# Patient Record
Sex: Male | Born: 1945
Health system: Southern US, Community
[De-identification: ages and names within clinical notes are randomized; demographics above are authoritative.]

## PROBLEM LIST (undated history)

## (undated) DIAGNOSIS — E785 Hyperlipidemia, unspecified: Secondary | ICD-10-CM

## (undated) DIAGNOSIS — I10 Essential (primary) hypertension: Secondary | ICD-10-CM

## (undated) DIAGNOSIS — K219 Gastro-esophageal reflux disease without esophagitis: Secondary | ICD-10-CM

## (undated) DIAGNOSIS — D689 Coagulation defect, unspecified: Secondary | ICD-10-CM

## (undated) DIAGNOSIS — H3530 Unspecified macular degeneration: Secondary | ICD-10-CM

## (undated) DIAGNOSIS — I2510 Atherosclerotic heart disease of native coronary artery without angina pectoris: Secondary | ICD-10-CM

## (undated) HISTORY — DX: Unspecified macular degeneration: H35.30

## (undated) HISTORY — PX: CATARACT EXTRACTION: SUR2

## (undated) HISTORY — DX: Coagulation defect, unspecified: D68.9

## (undated) HISTORY — DX: Essential (primary) hypertension: I10

## (undated) HISTORY — DX: Atherosclerotic heart disease of native coronary artery without angina pectoris: I25.10

## (undated) HISTORY — DX: Hyperlipidemia, unspecified: E78.5

## (undated) HISTORY — DX: Gastro-esophageal reflux disease without esophagitis: K21.9

---

## 2003-10-14 HISTORY — PX: PTCA: SHX146

## 2003-10-17 ENCOUNTER — Encounter: Admission: RE | Admit: 2003-10-17 | Discharge: 2003-10-17 | Payer: Self-pay | Admitting: Family Medicine

## 2003-11-06 ENCOUNTER — Ambulatory Visit (HOSPITAL_COMMUNITY): Admission: RE | Admit: 2003-11-06 | Discharge: 2003-11-07 | Payer: Self-pay | Admitting: Cardiology

## 2003-11-18 ENCOUNTER — Encounter (HOSPITAL_COMMUNITY): Admission: RE | Admit: 2003-11-18 | Discharge: 2004-02-16 | Payer: Self-pay | Admitting: Cardiology

## 2003-11-21 ENCOUNTER — Observation Stay (HOSPITAL_COMMUNITY): Admission: EM | Admit: 2003-11-21 | Discharge: 2003-11-21 | Payer: Self-pay | Admitting: Emergency Medicine

## 2004-02-17 ENCOUNTER — Encounter (HOSPITAL_COMMUNITY): Admission: RE | Admit: 2004-02-17 | Discharge: 2004-02-29 | Payer: Self-pay | Admitting: Cardiology

## 2004-03-03 ENCOUNTER — Encounter (HOSPITAL_COMMUNITY): Admission: RE | Admit: 2004-03-03 | Discharge: 2004-06-01 | Payer: Self-pay | Admitting: Cardiology

## 2004-04-15 ENCOUNTER — Ambulatory Visit (HOSPITAL_COMMUNITY): Admission: RE | Admit: 2004-04-15 | Discharge: 2004-04-15 | Payer: Self-pay | Admitting: Cardiology

## 2004-06-16 ENCOUNTER — Encounter (HOSPITAL_COMMUNITY): Admission: RE | Admit: 2004-06-16 | Discharge: 2004-09-09 | Payer: Self-pay | Admitting: Cardiology

## 2012-01-21 ENCOUNTER — Other Ambulatory Visit: Payer: Self-pay | Admitting: Family Medicine

## 2012-01-21 DIAGNOSIS — Z136 Encounter for screening for cardiovascular disorders: Secondary | ICD-10-CM

## 2012-01-28 ENCOUNTER — Ambulatory Visit
Admission: RE | Admit: 2012-01-28 | Discharge: 2012-01-28 | Disposition: A | Discharge status: Home / Self Care | Payer: 59 | Source: Ambulatory Visit | Attending: Family Medicine | Admitting: Family Medicine

## 2012-01-28 DIAGNOSIS — Z136 Encounter for screening for cardiovascular disorders: Secondary | ICD-10-CM

## 2014-06-13 DIAGNOSIS — I10 Essential (primary) hypertension: Secondary | ICD-10-CM | POA: Insufficient documentation

## 2014-06-13 DIAGNOSIS — I2510 Atherosclerotic heart disease of native coronary artery without angina pectoris: Secondary | ICD-10-CM | POA: Insufficient documentation

## 2014-06-13 DIAGNOSIS — E785 Hyperlipidemia, unspecified: Secondary | ICD-10-CM | POA: Insufficient documentation

## 2014-06-19 ENCOUNTER — Encounter: Payer: Self-pay | Admitting: Interventional Cardiology

## 2014-06-19 ENCOUNTER — Ambulatory Visit (INDEPENDENT_AMBULATORY_CARE_PROVIDER_SITE_OTHER): Payer: 59 | Admitting: Interventional Cardiology

## 2014-06-19 VITALS — BP 132/68 | HR 61 | Ht 70.0 in | Wt 221.4 lb

## 2014-06-19 DIAGNOSIS — I2510 Atherosclerotic heart disease of native coronary artery without angina pectoris: Secondary | ICD-10-CM

## 2014-06-19 DIAGNOSIS — I10 Essential (primary) hypertension: Secondary | ICD-10-CM

## 2014-06-19 DIAGNOSIS — E785 Hyperlipidemia, unspecified: Secondary | ICD-10-CM

## 2014-06-19 NOTE — Progress Notes (Signed)
Patient ID: Raymond Alexander, male   DOB: [birth year], 68 y.o.   MRN: [id number]    [address] [place], [place]  [address] Phone: [phone] Fax:  [phone]  Date:  06/19/2014   ID:  Raymond Alexander, DOB [birth year], MRN [id number]  PCP:  No primary provider on file.   ASSESSMENT:  1. Coronary artery disease with prior RCA stent 2004 and documented occluded septal perforator with collateral and 85% moderate size obtuse marginal branch 2. Hyperlipidemia, stable 3. Hypertension under excellent control  PLAN:  1. Maintain an active lifestyle 2. Lipids are being monitored and managed by primary physician, Dr. Lauretta Grill Koirala 3. Clinical followup with me in one year 4. No functional testing required   SUBJECTIVE: Raymond Alexander is a 68 y.o. male who is asymptomatic. No medication side effects. No neurological complaints. Denies claudication.   Wt Readings from Last 3 Encounters:  06/19/14 221 lb 6.4 oz (100.426 kg)     Past Medical History  Diagnosis Date  . Hypertension   . Hyperlipidemia   . Clotting disorder   . Esophageal reflux   . ASCVD (arteriosclerotic cardiovascular disease)     3 vessel    Current Outpatient Prescriptions  Medication Sig Dispense Refill  . aspirin 81 MG tablet Take 81 mg by mouth daily.      [name] atorvastatin (LIPITOR) 80 MG tablet Take 80 mg by mouth daily.      . Coenzyme Q10 (CO Q-10) 100 MG CAPS Take 300 mg by mouth daily.      . diphenhydrAMINE (BENADRYL) 25 mg capsule Take 50 mg by mouth as needed.      . ezetimibe (ZETIA) 10 MG tablet Take 10 mg by mouth daily.      . fexofenadine (ALLEGRA) 180 MG tablet Take 180 mg by mouth daily.      [name] lisinopril (PRINIVIL,ZESTRIL) 10 MG tablet Take 10 mg by mouth daily.      . Mag Aspart-Potassium Aspart (POTASSIUM & MAGNESIUM ASPARTAT) 250-250 MG CAPS Take 1 tablet by mouth daily.      . Multiple Vitamin (MULTIVITAMIN) tablet Take 1 tablet by mouth daily.      . nitroGLYCERIN (NITROSTAT)  0.4 MG SL tablet Place 0.4 mg under the tongue every 5 (five) minutes as needed for chest pain.      . Omega-3 Fatty Acids (FISH OIL) 1000 MG CAPS Take 2 capsules by mouth daily.      . sildenafil (VIAGRA) 50 MG tablet Take 50 mg by mouth daily as needed for erectile dysfunction.       No current facility-administered medications for this visit.    Allergies:   Not on File  Social History:  The patient  reports that he has quit smoking. He does not have any smokeless tobacco history on file. He reports that he drinks about .6 ounces of alcohol per week.   ROS:  Please see the history of present illness.   Has not noted lower joint swelling, palpitations, transient neurological complaints, wheezing, dyspnea, orthopnea, syncope, or other complaints   All other systems reviewed and negative.   OBJECTIVE: VS:  BP 132/68  Pulse 61  Ht 5\' 10"  (1.778 m)  Wt 221 lb 6.4 oz (100.426 kg)  BMI 31.77 kg/m2 Well nourished, well developed, in no acute distress, appears slightly overweight  HEENT: normal Neck: JVD flat. Carotid bruit absent  Cardiac:  normal S1, S2; RRR; no murmur Lungs:  clear to auscultation  bilaterally, no wheezing, rhonchi or rales Abd: soft, nontender, no hepatomegaly Ext: Edema no edema. Pulses 2+ Skin: warm and dry Neuro:  CNs 2-12 intact, no focal abnormalities noted  EKG:  Normal       Signed, Illene Labrador III, MD 06/19/2014 11:52 AM

## 2014-06-19 NOTE — Patient Instructions (Signed)
Your physician recommends that you continue on your current medications as directed. Please refer to the Current Medication list given to you today.  Your physician discussed the importance of regular exercise and recommended that you start or continue a regular exercise program for good health.   Your physician wants you to follow-up in: 1 year You will receive a reminder letter in the mail two months in advance. If you don't receive a letter, please call our office to schedule the follow-up appointment.  

## 2015-06-23 NOTE — Progress Notes (Signed)
Cardiology Office Note   Date:  06/24/2015   ID:  THUNDER BRIDGEWATER, DOB 12/22/1945, MRN [id number]  PCP:  Lujean Amel, MD  Cardiologist:  Sinclair Grooms, MD   Chief Complaint  Patient presents with  . Coronary Artery Disease      History of Present Illness: Raymond Alexander is a 69 y.o. male who presents for  CAD, PCI of RCA 2004, hyperlipidemia , and hypertension.   Keanu is doing well. He has been biking and recently completed a 195 mile bike from [place] to [place]. He had no difficulty. It was a 5 day event. He is exercising without limitations. His weight has increased some. He has not had angina, palpitations, or syncope.    Past Medical History  Diagnosis Date  . Hypertension   . Hyperlipidemia   . Clotting disorder   . Esophageal reflux   . ASCVD (arteriosclerotic cardiovascular disease)     3 vessel    Past Surgical History  Procedure Laterality Date  . Ptca  10/2003    mid RCA     Current Outpatient Prescriptions  Medication Sig Dispense Refill  . aspirin 81 MG tablet Take 81 mg by mouth daily.    [name] atorvastatin (LIPITOR) 80 MG tablet Take 80 mg by mouth daily.    . Coenzyme Q10 (CO Q-10) 100 MG CAPS Take 300 mg by mouth daily.    . diphenhydrAMINE (BENADRYL) 25 mg capsule Take 50 mg by mouth as needed for allergies.     [name] ezetimibe (ZETIA) 10 MG tablet Take 10 mg by mouth daily.    . fexofenadine (ALLEGRA) 180 MG tablet Take 180 mg by mouth daily as needed for allergies.     [name] lisinopril (PRINIVIL,ZESTRIL) 10 MG tablet Take 10 mg by mouth daily.    . Mag Aspart-Potassium Aspart (POTASSIUM & MAGNESIUM ASPARTAT) 250-250 MG CAPS Take 1 tablet by mouth daily.    . Multiple Vitamin (MULTIVITAMIN) tablet Take 1 tablet by mouth daily.    . nitroGLYCERIN (NITROSTAT) 0.4 MG SL tablet Place 0.4 mg under the tongue every 5 (five) minutes as needed for chest pain.    . Omega-3 Fatty Acids (FISH OIL) 1000 MG CAPS Take 2 capsules by mouth daily.    . sildenafil  (VIAGRA) 50 MG tablet Take 50 mg by mouth daily as needed for erectile dysfunction.     No current facility-administered medications for this visit.    Allergies:   Review of patient's allergies indicates no known allergies.    Social History:  The patient  reports that he has quit smoking. He has never used smokeless tobacco. He reports that he drinks about 0.6 oz of alcohol per week.   Family History:  The patient's family history includes Cancer in his brother and mother; Diabetes in his sister; Healthy in his brother; Heart attack in his father.    ROS:  Please see the history of present illness.   Otherwise, review of systems are positive for  With coffee or chocolate he will occasionally have increased heart rate. He has occasional spells of vertigo and he snores..   All other systems are reviewed and negative.    PHYSICAL EXAM: VS:  BP 126/68 mmHg  Pulse 69  Ht 5\' 10"  (1.778 m)  Wt 102.331 kg (225 lb 9.6 oz)  BMI 32.37 kg/m2 , BMI Body mass index is 32.37 kg/(m^2). GEN: Well nourished, well developed, in no acute distress HEENT: normal Neck: no JVD, carotid bruits,  or masses Cardiac: RRR; no murmurs, rubs, or gallops,no edema  Respiratory:  clear to auscultation bilaterally, normal work of breathing GI: soft, nontender, nondistended, + BS MS: no deformity or atrophy Skin: warm and dry, no rash Neuro:  Strength and sensation are intact Psych: euthymic mood, full affect   EKG:  EKG is ordered today. The ekg ordered today demonstrates  Sinus rhythm with normal overall appearance   Recent Labs: No results found for requested labs within last 365 days.    Lipid Panel No results found for: CHOL, TRIG, HDL, CHOLHDL, VLDL, LDLCALC, LDLDIRECT    Wt Readings from Last 3 Encounters:  06/24/15 102.331 kg (225 lb 9.6 oz)  06/19/14 100.426 kg (221 lb 6.4 oz)      Other studies Reviewed: Additional studies/ records that were reviewed today include: .    ASSESSMENT AND  PLAN:  1. Atherosclerosis of native coronary artery of native heart without angina pectoris  asymptomatic an significant physical activity in his routine daily activities.  2. Essential hypertension, benign  controlled  3. Hyperlipidemia  followed by Dr. Dorthy Cooler    Current medicines are reviewed at length with the patient today.  The patient does not have concerns regarding medicines.  The following changes have been made:  no change  Labs/ tests ordered today include:   Orders Placed This Encounter  Procedures  . Myocardial Perfusion Imaging  . EKG 12-Lead     Disposition:   FU with HS in 1 year  Signed, Sinclair Grooms, MD  06/24/2015 5:45 PM    [address], [place], [place]  [address] Phone: [phone]; Fax: [phone]

## 2015-06-24 ENCOUNTER — Ambulatory Visit (INDEPENDENT_AMBULATORY_CARE_PROVIDER_SITE_OTHER): Payer: 59 | Admitting: Interventional Cardiology

## 2015-06-24 ENCOUNTER — Encounter: Payer: Self-pay | Admitting: Interventional Cardiology

## 2015-06-24 VITALS — BP 126/68 | HR 69 | Ht 70.0 in | Wt 225.6 lb

## 2015-06-24 DIAGNOSIS — I2510 Atherosclerotic heart disease of native coronary artery without angina pectoris: Secondary | ICD-10-CM | POA: Diagnosis not present

## 2015-06-24 DIAGNOSIS — I10 Essential (primary) hypertension: Secondary | ICD-10-CM

## 2015-06-24 DIAGNOSIS — E785 Hyperlipidemia, unspecified: Secondary | ICD-10-CM | POA: Diagnosis not present

## 2015-06-24 NOTE — Patient Instructions (Signed)
Medication Instructions:  Your physician recommends that you continue on your current medications as directed. Please refer to the Current Medication list given to you today.   Labwork: None   Testing/Procedures: Your physician has requested that you have en exercise stress myoview. For further information please visit [email or web address]. Please follow instruction sheet, as given. (To be scheduled in July 2017)   Follow-Up: Your physician wants you to follow-up in: 1 year with Dr.Smith You will receive a reminder letter in the mail two months in advance. If you don't receive a letter, please call our office to schedule the follow-up appointment.   Any Other Special Instructions Will Be Listed Below (If Applicable).

## 2016-06-09 ENCOUNTER — Telehealth (HOSPITAL_COMMUNITY): Payer: Self-pay | Admitting: *Deleted

## 2016-06-09 NOTE — Telephone Encounter (Signed)
Attempted to call patient regarding upcoming nuclear appointment- no answer. Hubbard Robinson, RN

## 2016-06-14 ENCOUNTER — Ambulatory Visit (HOSPITAL_COMMUNITY): Payer: 59

## 2016-06-14 ENCOUNTER — Telehealth (HOSPITAL_COMMUNITY): Payer: Self-pay | Admitting: Interventional Cardiology

## 2016-06-14 DIAGNOSIS — R0989 Other specified symptoms and signs involving the circulatory and respiratory systems: Secondary | ICD-10-CM

## 2016-06-16 NOTE — Telephone Encounter (Signed)
Called both home # and cell and was unable to leave a message due to mailbox being full on cell. He will be removed from workqueue.

## 2016-06-21 ENCOUNTER — Other Ambulatory Visit: Payer: Self-pay

## 2016-06-21 DIAGNOSIS — I2510 Atherosclerotic heart disease of native coronary artery without angina pectoris: Secondary | ICD-10-CM

## 2016-06-30 ENCOUNTER — Telehealth (HOSPITAL_COMMUNITY): Payer: Self-pay | Admitting: *Deleted

## 2016-06-30 NOTE — Telephone Encounter (Signed)
Attempted to call patient regarding upcoming appointment- no answer. Maryhelen Lindler J Rolin Schult, RN 

## 2016-07-05 ENCOUNTER — Ambulatory Visit (HOSPITAL_COMMUNITY): Payer: 59 | Attending: Internal Medicine

## 2016-07-05 DIAGNOSIS — I2510 Atherosclerotic heart disease of native coronary artery without angina pectoris: Secondary | ICD-10-CM | POA: Diagnosis not present

## 2016-07-05 DIAGNOSIS — Z87891 Personal history of nicotine dependence: Secondary | ICD-10-CM | POA: Diagnosis not present

## 2016-07-05 DIAGNOSIS — R0609 Other forms of dyspnea: Secondary | ICD-10-CM | POA: Diagnosis not present

## 2016-07-05 DIAGNOSIS — I10 Essential (primary) hypertension: Secondary | ICD-10-CM | POA: Insufficient documentation

## 2016-07-05 DIAGNOSIS — Z8249 Family history of ischemic heart disease and other diseases of the circulatory system: Secondary | ICD-10-CM | POA: Insufficient documentation

## 2016-07-05 LAB — MYOCARDIAL PERFUSION IMAGING
Estimated workload: 7 METS
Exercise duration (min): 6 min
Exercise duration (sec): 0 s
LV dias vol: 99 mL (ref 62–150)
LV sys vol: 45 mL
MPHR: 149 {beats}/min
Peak HR: 133 {beats}/min
Percent HR: 89 %
RATE: 0.32
Rest HR: 62 {beats}/min
SDS: 1
SRS: 4
SSS: 5
TID: 1.02

## 2016-07-05 MED ORDER — TECHNETIUM TC 99M TETROFOSMIN IV KIT
10.7000 | PACK | Freq: Once | INTRAVENOUS | Status: AC | PRN
  Administered 2016-07-05: 11 via INTRAVENOUS
  Filled 2016-07-05: qty 11

## 2016-07-05 MED ORDER — TECHNETIUM TC 99M TETROFOSMIN IV KIT
33.0000 | PACK | Freq: Once | INTRAVENOUS | Status: AC | PRN
  Administered 2016-07-05: 33 via INTRAVENOUS
  Filled 2016-07-05: qty 33

## 2016-07-07 ENCOUNTER — Telehealth: Payer: Self-pay | Admitting: Interventional Cardiology

## 2016-07-07 NOTE — Telephone Encounter (Signed)
New message    The pt is calling to get the results of his stress test from Monday

## 2016-07-07 NOTE — Telephone Encounter (Signed)
-----   Message from Belva Crome, MD sent at 07/05/2016  4:01 PM EDT ----- Let the patient know that the stress study is normal. A copy will be sent to [facility], MD

## 2016-07-07 NOTE — Telephone Encounter (Signed)
[email or web address] identifies his self by name.Let the patient know that the stress study is normal. Pt is to call back if any questions or concerns

## 2016-08-18 ENCOUNTER — Encounter: Payer: Self-pay | Admitting: Interventional Cardiology

## 2016-09-02 ENCOUNTER — Ambulatory Visit (INDEPENDENT_AMBULATORY_CARE_PROVIDER_SITE_OTHER): Payer: 59 | Admitting: Interventional Cardiology

## 2016-09-02 ENCOUNTER — Encounter (INDEPENDENT_AMBULATORY_CARE_PROVIDER_SITE_OTHER): Payer: Self-pay

## 2016-09-02 ENCOUNTER — Encounter: Payer: Self-pay | Admitting: Interventional Cardiology

## 2016-09-02 VITALS — BP 138/74 | HR 65 | Ht 70.0 in | Wt 228.0 lb

## 2016-09-02 DIAGNOSIS — I2510 Atherosclerotic heart disease of native coronary artery without angina pectoris: Secondary | ICD-10-CM

## 2016-09-02 DIAGNOSIS — I10 Essential (primary) hypertension: Secondary | ICD-10-CM

## 2016-09-02 DIAGNOSIS — E785 Hyperlipidemia, unspecified: Secondary | ICD-10-CM

## 2016-09-02 NOTE — Patient Instructions (Signed)
Medication Instructions:  Your physician recommends that you continue on your current medications as directed. Please refer to the Current Medication list given to you today.   Labwork: None Ordered   Testing/Procedures: None Ordered   Follow-Up: Your physician wants you to follow-up in: 1 year with Dr. Tamala Julian. You will receive a reminder letter in the mail two months in advance. If you don't receive a letter, please call our office to schedule the follow-up appointment.  Any Other Special Instructions Will Be Listed Below (If Applicable). 1) Stay active  2) Blood pressure target less than 130/90 and LDL (bad cholesterol) goal less than 70.    If you need a refill on your cardiac medications before your next appointment, please call your pharmacy.

## 2016-09-02 NOTE — Progress Notes (Signed)
Cardiology Office Note    Date:  09/02/2016   ID:  Delphin, Mccollin 1946-01-01, MRN [id number]  PCP:  Lujean Amel, MD  Cardiologist: Sinclair Grooms, MD   Chief Complaint  Patient presents with  . Atrial Fibrillation    History of Present Illness:  Raymond Alexander is a 70 y.o. male who presents for  CAD, PCI /Stent of RCA 2004, hyperlipidemia , and hypertension.   He is doing well. Recent negative stress test for ischemia. No recent episodes of palpitation, syncope, or physical limitation. Dr. Dorthy Cooler follows his lipids.    Past Medical History:  Diagnosis Date  . ASCVD (arteriosclerotic cardiovascular disease)    3 vessel  . Clotting disorder ([facility])   . Esophageal reflux   . Hyperlipidemia   . Hypertension     Past Surgical History:  Procedure Laterality Date  . PTCA  10/2003   mid RCA    Current Medications: Outpatient Medications Prior to Visit  Medication Sig Dispense Refill  . aspirin 81 MG tablet Take 81 mg by mouth daily.    [name] atorvastatin (LIPITOR) 80 MG tablet Take 80 mg by mouth daily.    . Coenzyme Q10 (CO Q-10) 100 MG CAPS Take 300 mg by mouth daily.    . diphenhydrAMINE (BENADRYL) 25 mg capsule Take 50 mg by mouth as needed for allergies.     [name] ezetimibe (ZETIA) 10 MG tablet Take 10 mg by mouth daily.    . fexofenadine (ALLEGRA) 180 MG tablet Take 180 mg by mouth daily as needed for allergies.     [name] lisinopril (PRINIVIL,ZESTRIL) 10 MG tablet Take 10 mg by mouth daily.    . Mag Aspart-Potassium Aspart (POTASSIUM & MAGNESIUM ASPARTAT) 250-250 MG CAPS Take 1 tablet by mouth daily.    . Multiple Vitamin (MULTIVITAMIN) tablet Take 1 tablet by mouth daily.    . nitroGLYCERIN (NITROSTAT) 0.4 MG SL tablet Place 0.4 mg under the tongue every 5 (five) minutes as needed for chest pain.    . Omega-3 Fatty Acids (FISH OIL) 1000 MG CAPS Take 2 capsules by mouth daily.    . sildenafil (VIAGRA) 50 MG tablet Take 50 mg by mouth daily as needed for erectile  dysfunction.     No facility-administered medications prior to visit.      Allergies:   Review of patient's allergies indicates no known allergies.   Social History   Social History  . Marital status: Legally Separated    Spouse name: N/A  . Number of children: N/A  . Years of education: N/A   Social History Main Topics  . Smoking status: Former Research scientist (life sciences)  . Smokeless tobacco: Never Used  . Alcohol use 0.6 oz/week    1 Glasses of wine per week     Comment: 1-2 per day  . Drug use: No  . Sexual activity: Not Asked   Other Topics Concern  . None   Social History Narrative  . None     Family History:  The patient's family history includes Cancer in his brother and mother; Diabetes in his sister; Healthy in his brother; Heart attack in his father.   ROS:   Please see the history of present illness.    Occasional lower extremity swelling. Snoring.  All other systems reviewed and are negative.   PHYSICAL EXAM:   VS:  BP 138/74   Pulse 65   Ht 5\' 10"  (1.778 m)   Wt 228 lb (103.4 kg)  BMI 32.71 kg/m    GEN: Well nourished, well developed, in no acute distress  HEENT: normal  Neck: no JVD, carotid bruits, or masses Cardiac: RRR; no murmurs, rubs, or gallops,no edema  Respiratory:  clear to auscultation bilaterally, normal work of breathing GI: soft, nontender, nondistended, + BS MS: no deformity or atrophy  Skin: warm and dry, no rash Neuro:  Alert and Oriented x 3, Strength and sensation are intact Psych: euthymic mood, full affect  Wt Readings from Last 3 Encounters:  09/02/16 228 lb (103.4 kg)  07/05/16 225 lb (102.1 kg)  06/24/15 225 lb 9.6 oz (102.3 kg)      Studies/Labs Reviewed:   EKG:  EKG  Normal sinus rhythm with nonspecific ST abnormality.  Recent Labs: No results found for requested labs within last 8760 hours.   Lipid Panel No results found for: CHOL, TRIG, HDL, CHOLHDL, VLDL, LDLCALC, LDLDIRECT  Additional studies/ records that were  reviewed today include:  Nuclear Perfusion Study: 07/05/16 Study Highlights     The left ventricular ejection fraction is normal (55-65%).  Nuclear stress EF: 55%.  Blood pressure demonstrated a normal response to exercise.  There was no ST segment deviation noted during stress.  The study is normal.  This is a low risk study.   Normal resting and stress perfusion. No ischemia or infarction EF 55% Test low risk but nondiagnostic as patient only achieved 89% of Predicted PMHR      ASSESSMENT:    1. Atherosclerosis of native coronary artery of native heart without angina pectoris   2. Essential hypertension, benign   3. Hyperlipidemia      PLAN:  In order of problems listed above:  1. Stable with nonischemic stress tests done 2 months ago. 2. Well controlled. Low-salt diet. Target 130/90 mmHg less. 3. Followed by primary care. Target is less than 70.    Medication Adjustments/Labs and Tests Ordered: Current medicines are reviewed at length with the patient today.  Concerns regarding medicines are outlined above.  Medication changes, Labs and Tests ordered today are listed in the Patient Instructions below. Patient Instructions  Medication Instructions:  Your physician recommends that you continue on your current medications as directed. Please refer to the Current Medication list given to you today.   Labwork: None Ordered   Testing/Procedures: None Ordered   Follow-Up: Your physician wants you to follow-up in: 1 year with Dr. Tamala Julian. You will receive a reminder letter in the mail two months in advance. If you don't receive a letter, please call our office to schedule the follow-up appointment.  Any Other Special Instructions Will Be Listed Below (If Applicable). 1) Stay active  2) Blood pressure target less than 130/90 and LDL (bad cholesterol) goal less than 70.    If you need a refill on your cardiac medications before your next appointment, please  call your pharmacy.      Signed, Sinclair Grooms, MD  09/02/2016 8:35 AM    [address], [place], [place]  [address] Phone: [phone]; Fax: [phone]

## 2016-12-23 ENCOUNTER — Other Ambulatory Visit: Payer: Self-pay | Admitting: Family Medicine

## 2016-12-23 DIAGNOSIS — M5431 Sciatica, right side: Secondary | ICD-10-CM

## 2016-12-24 ENCOUNTER — Ambulatory Visit
Admission: RE | Admit: 2016-12-24 | Discharge: 2016-12-24 | Disposition: A | Discharge status: Home / Self Care | Payer: 59 | Source: Ambulatory Visit | Attending: Family Medicine | Admitting: Family Medicine

## 2016-12-24 DIAGNOSIS — M5431 Sciatica, right side: Secondary | ICD-10-CM

## 2017-08-29 ENCOUNTER — Encounter: Payer: Self-pay | Admitting: Interventional Cardiology

## 2017-08-29 ENCOUNTER — Ambulatory Visit (INDEPENDENT_AMBULATORY_CARE_PROVIDER_SITE_OTHER): Payer: 59 | Admitting: Interventional Cardiology

## 2017-08-29 VITALS — BP 138/70 | HR 66 | Ht 70.0 in | Wt 224.4 lb

## 2017-08-29 DIAGNOSIS — I10 Essential (primary) hypertension: Secondary | ICD-10-CM | POA: Diagnosis not present

## 2017-08-29 DIAGNOSIS — I2510 Atherosclerotic heart disease of native coronary artery without angina pectoris: Secondary | ICD-10-CM | POA: Diagnosis not present

## 2017-08-29 DIAGNOSIS — E7849 Other hyperlipidemia: Secondary | ICD-10-CM

## 2017-08-29 DIAGNOSIS — E784 Other hyperlipidemia: Secondary | ICD-10-CM

## 2017-08-29 NOTE — Patient Instructions (Signed)

## 2017-08-29 NOTE — Progress Notes (Signed)
Cardiology Office Note    Date:  08/29/2017   ID:  Raymond, Alexander [birth year], MRN [id number]  PCP:  Lujean Amel, MD  Cardiologist: Sinclair Grooms, MD   Chief Complaint  Patient presents with  . Coronary Artery Disease  . Follow-up    Hypertension  . Hyperlipidemia    History of Present Illness:  Raymond Alexander is a 71 y.o. male who presents For follow-up of CAD with known mid RCA DES/total occlusion of septal perforator with collaterals/85% obtuse marginal, nonischemic exercise treadmill test 2017, hyperlipidemia , and hypertension.  Raymond Alexander is doing well. He recently walked across [place] over a two-week time frame sometimes walking up to 8 miles per day. No limitations of dyspnea, chest discomfort, palpitations, or syncope.  No medication side effects. Laboratory data is done by primary care.     Past Medical History:  Diagnosis Date  . ASCVD (arteriosclerotic cardiovascular disease)    3 vessel  . Clotting disorder ([facility])   . Esophageal reflux   . Hyperlipidemia   . Hypertension     Past Surgical History:  Procedure Laterality Date  . PTCA  10/2003   mid RCA    Current Medications: Outpatient Medications Prior to Visit  Medication Sig Dispense Refill  . aspirin 81 MG tablet Take 81 mg by mouth daily.    [name] atorvastatin (LIPITOR) 80 MG tablet Take 80 mg by mouth daily.    . Coenzyme Q10 (CO Q-10) 100 MG CAPS Take 300 mg by mouth daily.    [name] ezetimibe (ZETIA) 10 MG tablet Take 10 mg by mouth daily.    . fexofenadine (ALLEGRA) 180 MG tablet Take 180 mg by mouth daily as needed for allergies.     [name] lisinopril (PRINIVIL,ZESTRIL) 10 MG tablet Take 10 mg by mouth daily.    . Mag Aspart-Potassium Aspart (POTASSIUM & MAGNESIUM ASPARTAT) 250-250 MG CAPS Take 1 tablet by mouth daily.    . Multiple Vitamin (MULTIVITAMIN) tablet Take 1 tablet by mouth daily.    . nitroGLYCERIN (NITROSTAT) 0.4 MG SL tablet Place 0.4 mg under the tongue every 5 (five) minutes as  needed for chest pain.    . Omega-3 Fatty Acids (FISH OIL) 1000 MG CAPS Take 2 capsules by mouth daily.    [name] OVER THE COUNTER MEDICATION Take 1 tablet by mouth daily. Med Name: PROTANDIM    . sildenafil (VIAGRA) 50 MG tablet Take 50 mg by mouth daily as needed for erectile dysfunction.    . diphenhydrAMINE (BENADRYL) 25 mg capsule Take 50 mg by mouth as needed for allergies.      No facility-administered medications prior to visit.      Allergies:   Patient has no known allergies.   Social History   Social History  . Marital status: Legally Separated    Spouse name: N/A  . Number of children: N/A  . Years of education: N/A   Social History Main Topics  . Smoking status: Former Research scientist (life sciences)  . Smokeless tobacco: Never Used  . Alcohol use 0.6 oz/week    1 Glasses of wine per week     Comment: 1-2 per day  . Drug use: No  . Sexual activity: Not Asked   Other Topics Concern  . None   Social History Narrative  . None     Family History:  The patient's family history includes Cancer in his brother and mother; Diabetes in his sister; Healthy in his brother; Heart attack in his  father.   ROS:   Please see the history of present illness.    Abdominal strain. No articular or back complaints. No medication side effects. Mild erectile dysfunction.  All other systems reviewed and are negative.   PHYSICAL EXAM:   VS:  BP 138/70   Pulse 66   Ht 5\' 10"  (1.778 m)   Wt 224 lb 6.4 oz (101.8 kg)   BMI 32.20 kg/m    GEN: Well nourished, well developed, in no acute distress  HEENT: normal  Neck: no JVD, carotid bruits, or masses Cardiac: RRR; no murmurs, rubs, or gallops,no edema  Respiratory:  clear to auscultation bilaterally, normal work of breathing GI: soft, nontender, nondistended, + BS MS: no deformity or atrophy  Skin: warm and dry, no rash Neuro:  Alert and Oriented x 3, Strength and sensation are intact Psych: euthymic mood, full affect  Wt Readings from Last 3 Encounters:    08/29/17 224 lb 6.4 oz (101.8 kg)  09/02/16 228 lb (103.4 kg)  07/05/16 225 lb (102.1 kg)      Studies/Labs Reviewed:   EKG:  EKG  Normal sinus rhythm with nonspecific ST abnormality. No change compared to prior.  Recent Labs: No results found for requested labs within last 8760 hours.   Lipid Panel No results found for: CHOL, TRIG, HDL, CHOLHDL, VLDL, LDLCALC, LDLDIRECT  Additional studies/ records that were reviewed today include:  No recent laboratory data. Lipids are managed by Dr. Dorthy Cooler  EXERCISE treadmill test 2017: Study Highlights     The left ventricular ejection fraction is normal (55-65%).  Nuclear stress EF: 55%.  Blood pressure demonstrated a normal response to exercise.  There was no ST segment deviation noted during stress.  The study is normal.  This is a low risk study.   Normal resting and stress perfusion. No ischemia or infarction EF 55% Test low risk but nondiagnostic as patient only achieved 89% of Predicted PMHR      ASSESSMENT:    1. Atherosclerosis of native coronary artery of native heart without angina pectoris   2. Essential hypertension, benign   3. Other hyperlipidemia      PLAN:  In order of problems listed above:  1. Recent nonischemic exercise treadmill test in 2017. Please see above. Excellent exercise tolerance. Continue risk factor modification without change in the current medical regimen. 2. Target blood pressure 130/85 mmHg a less. Low salt diet. Encourage weight loss. 3. LDL target less than equal to 70. Low fat diet. Blood work including lipid and liver panel once per year by primary care  Encouraged active lifestyle, aerobic activity, weight loss, and compliance with medical regimen.  Medication Adjustments/Labs and Tests Ordered: Current medicines are reviewed at length with the patient today.  Concerns regarding medicines are outlined above.  Medication changes, Labs and Tests ordered today are listed in the  Patient Instructions below. Patient Instructions  Medication Instructions:  None  Labwork: None  Testing/Procedures: None  Follow-Up: Your physician wants you to follow-up in: 1 year with Dr. Tamala Julian.  You will receive a reminder letter in the mail two months in advance. If you don't receive a letter, please call our office to schedule the follow-up appointment.   Any Other Special Instructions Will Be Listed Below (If Applicable).     If you need a refill on your cardiac medications before your next appointment, please call your pharmacy.      Signed, Sinclair Grooms, MD  08/29/2017 8:22 AM  [facility] [place], [place], [place]  [address] Phone: [phone]; Fax: [phone]

## 2018-09-15 ENCOUNTER — Other Ambulatory Visit: Payer: Self-pay | Admitting: Family Medicine

## 2018-09-15 ENCOUNTER — Ambulatory Visit
Admission: RE | Admit: 2018-09-15 | Discharge: 2018-09-15 | Disposition: A | Discharge status: Home / Self Care | Payer: 59 | Source: Ambulatory Visit | Attending: Family Medicine | Admitting: Family Medicine

## 2018-09-15 DIAGNOSIS — R059 Cough, unspecified: Secondary | ICD-10-CM

## 2018-09-15 DIAGNOSIS — R05 Cough: Secondary | ICD-10-CM

## 2019-08-22 DIAGNOSIS — D1801 Hemangioma of skin and subcutaneous tissue: Secondary | ICD-10-CM | POA: Diagnosis not present

## 2019-08-22 DIAGNOSIS — L821 Other seborrheic keratosis: Secondary | ICD-10-CM | POA: Diagnosis not present

## 2019-08-22 DIAGNOSIS — D2262 Melanocytic nevi of left upper limb, including shoulder: Secondary | ICD-10-CM | POA: Diagnosis not present

## 2019-08-22 DIAGNOSIS — D225 Melanocytic nevi of trunk: Secondary | ICD-10-CM | POA: Diagnosis not present

## 2019-08-22 DIAGNOSIS — B351 Tinea unguium: Secondary | ICD-10-CM | POA: Diagnosis not present

## 2019-09-07 DIAGNOSIS — Z23 Encounter for immunization: Secondary | ICD-10-CM | POA: Diagnosis not present

## 2019-10-23 NOTE — Progress Notes (Signed)
Cardiology Office Note:    Date:  10/24/2019   ID:  Joellen Jersey, DOB 1946-01-07, MRN [id number]  PCP:  Lujean Amel, MD  Cardiologist:  Sinclair Grooms, MD   Referring MD: Lujean Amel, MD   Chief Complaint  Patient presents with  . Coronary Artery Disease  . Hyperlipidemia    History of Present Illness:    Raymond Alexander is a 73 y.o. male with a hx of CAD (mid RCA DES; total occlusion of septal perforator with collaterals; 85% obtuse marginal), nonischemic exercise treadmill test 2017, hyperlipidemia , and hypertension.  No complaints.  Greater than 150 minutes of moderate activity per week.  Has lost weight during the pandemic.  He is eating better because his time at home is more frequent and his wife is preparing nutritious meals rather than him having fast foods.  He has had no neurological complaints.  No palpitations or syncope.  Past Medical History:  Diagnosis Date  . ASCVD (arteriosclerotic cardiovascular disease)    3 vessel  . Clotting disorder ([place])   . Esophageal reflux   . Hyperlipidemia   . Hypertension     Past Surgical History:  Procedure Laterality Date  . PTCA  10/2003   mid RCA    Current Medications: Current Meds  Medication Sig  . aspirin 81 MG tablet Take 81 mg by mouth daily.  [name] atorvastatin (LIPITOR) 80 MG tablet Take 80 mg by mouth daily.  . Coenzyme Q10 (CO Q-10) 100 MG CAPS Take 300 mg by mouth daily.  [name] ezetimibe (ZETIA) 10 MG tablet Take 10 mg by mouth daily.  . fexofenadine (ALLEGRA) 180 MG tablet Take 180 mg by mouth daily as needed for allergies.   . fluticasone (FLONASE) 50 MCG/ACT nasal spray Place 1 spray into both nostrils daily.  [name] lisinopril (PRINIVIL,ZESTRIL) 10 MG tablet Take 10 mg by mouth daily.  . Mag Aspart-Potassium Aspart (POTASSIUM & MAGNESIUM ASPARTAT) 250-250 MG CAPS Take 1 tablet by mouth daily.  . Multiple Vitamin (MULTIVITAMIN) tablet Take 1 tablet by mouth daily.  . nitroGLYCERIN (NITROSTAT) 0.4 MG SL  tablet Place 0.4 mg under the tongue every 5 (five) minutes as needed for chest pain.  . Omega-3 Fatty Acids (FISH OIL) 1000 MG CAPS Take 2 capsules by mouth daily.  [name] OVER THE COUNTER MEDICATION Take 1 tablet by mouth daily. Med Name: PROTANDIM  . sildenafil (VIAGRA) 50 MG tablet Take 50 mg by mouth daily as needed for erectile dysfunction.     Allergies:   Patient has no known allergies.   Social History   Socioeconomic History  . Marital status: Legally Separated    Spouse name: Not on file  . Number of children: Not on file  . Years of education: Not on file  . Highest education level: Not on file  Occupational History  . Not on file  Social Needs  . Financial resource strain: Not on file  . Food insecurity    Worry: Not on file    Inability: Not on file  . Transportation needs    Medical: Not on file    Non-medical: Not on file  Tobacco Use  . Smoking status: Former Research scientist (life sciences)  . Smokeless tobacco: Never Used  Substance and Sexual Activity  . Alcohol use: Yes    Alcohol/week: 1.0 standard drinks    Types: 1 Glasses of wine per week    Comment: 1-2 per day  . Drug use: No  . Sexual activity: Not  on file  Lifestyle  . Physical activity    Days per week: Not on file    Minutes per session: Not on file  . Stress: Not on file  Relationships  . Social Herbalist on phone: Not on file    Gets together: Not on file    Attends religious service: Not on file    Active member of club or organization: Not on file    Attends meetings of clubs or organizations: Not on file    Relationship status: Not on file  Other Topics Concern  . Not on file  Social History Narrative  . Not on file     Family History: The patient's family history includes Cancer in his brother and mother; Diabetes in his sister; Healthy in his brother; Heart attack in his father.  ROS:   Please see the history of present illness.    No complaints all other systems reviewed and are negative.   EKGs/Labs/Other Studies Reviewed:    The following studies were reviewed today: Myocardial perfusion imaging July 2017: Study Highlights    The left ventricular ejection fraction is normal (55-65%).  Nuclear stress EF: 55%.  Blood pressure demonstrated a normal response to exercise.  There was no ST segment deviation noted during stress.  The study is normal.  This is a low risk study.   Normal resting and stress perfusion. No ischemia or infarction EF 55% Test low risk but nondiagnostic as patient only achieved 89% of Predicted PMHR     EKG:  EKG prominent voltage, biphasic T waves in V5 and V6.  Nonspecific inferior T wave abnormality.  ST-T wave findings are more pronounced when compared to 1 year ago.  Recent Labs: No results found for requested labs within last 8760 hours.  Recent Lipid Panel No results found for: CHOL, TRIG, HDL, CHOLHDL, VLDL, LDLCALC, LDLDIRECT  Physical Exam:    VS:  BP 138/68   Pulse (!) 56   Ht 5\' 10"  (1.778 m)   Wt 215 lb 6.4 oz (97.7 kg)   SpO2 97%   BMI 30.91 kg/m     Wt Readings from Last 3 Encounters:  10/24/19 215 lb 6.4 oz (97.7 kg)  08/29/17 224 lb 6.4 oz (101.8 kg)  09/02/16 228 lb (103.4 kg)     GEN: Mild obesity. No acute distress HEENT: Normal NECK: No JVD. LYMPHATICS: No lymphadenopathy CARDIAC:  RRR without murmur, gallop, or edema. VASCULAR:  Normal Pulses. No bruits. RESPIRATORY:  Clear to auscultation without rales, wheezing or rhonchi  ABDOMEN: Soft, non-tender, non-distended, No pulsatile mass, MUSCULOSKELETAL: No deformity  SKIN: Warm and dry NEUROLOGIC:  Alert and oriented x 3 PSYCHIATRIC:  Normal affect   ASSESSMENT:    1. Atherosclerosis of native coronary artery of native heart without angina pectoris   2. Essential hypertension, benign   3. Other hyperlipidemia   4. Educated about COVID-19 virus infection    PLAN:    In order of problems listed above:  1. Secondary prevention discussed.   He is hitting all metrics.  Encouraged to continue physical activity as the most important and essential of all. 2. Controlled with target blood pressure 132/68 use mercury obtained personally on my exam. 3. Last year his LDL was 65.  We need to keep him below 70.  He will be seeing primary care and have blood work done later this month. 4. The 3W's were discussed in detail and are being endorsed by the patient and  lifestyle.  Overall education and awareness concerning primary/secondary risk prevention was discussed in detail: LDL less than 70, hemoglobin A1c less than 7, blood pressure target less than 130/80 mmHg, >150 minutes of moderate aerobic activity per week, avoidance of smoking, weight control (via diet and exercise), and continued surveillance/management of/for obstructive sleep apnea.    Medication Adjustments/Labs and Tests Ordered: Current medicines are reviewed at length with the patient today.  Concerns regarding medicines are outlined above.  Orders Placed This Encounter  Procedures  . EKG 12-Lead   No orders of the defined types were placed in this encounter.   Patient Instructions  Medication Instructions:  Your physician recommends that you continue on your current medications as directed. Please refer to the Current Medication list given to you today.  *If you need a refill on your cardiac medications before your next appointment, please call your pharmacy*  Lab Work: None If you have labs (blood work) drawn today and your tests are completely normal, you will receive your results only by: [name] MyChart Message (if you have MyChart) OR . A paper copy in the mail If you have any lab test that is abnormal or we need to change your treatment, we will call you to review the results.  Testing/Procedures: None  Follow-Up: At [facility], you and your health needs are our priority.  As part of our continuing mission to provide you with exceptional heart care, we have  created designated Provider Care Teams.  These Care Teams include your primary Cardiologist (physician) and Advanced Practice Providers (APPs -  Physician Assistants and Nurse Practitioners) who all work together to provide you with the care you need, when you need it.  Your next appointment:   12 months  The format for your next appointment:   In Person  Provider:   You may see Sinclair Grooms, MD or one of the following Advanced Practice Providers on your designated Care Team:    Truitt Merle, NP  Cecilie Kicks, NP  Kathyrn Drown, NP   Other Instructions      Signed, Sinclair Grooms, MD  10/24/2019 8:41 AM    [place]

## 2019-10-24 ENCOUNTER — Ambulatory Visit: Payer: PPO | Admitting: Interventional Cardiology

## 2019-10-24 ENCOUNTER — Other Ambulatory Visit: Payer: Self-pay

## 2019-10-24 ENCOUNTER — Encounter: Payer: Self-pay | Admitting: Interventional Cardiology

## 2019-10-24 VITALS — BP 138/68 | HR 56 | Ht 70.0 in | Wt 215.4 lb

## 2019-10-24 DIAGNOSIS — I2510 Atherosclerotic heart disease of native coronary artery without angina pectoris: Secondary | ICD-10-CM | POA: Diagnosis not present

## 2019-10-24 DIAGNOSIS — E7849 Other hyperlipidemia: Secondary | ICD-10-CM | POA: Diagnosis not present

## 2019-10-24 DIAGNOSIS — Z7189 Other specified counseling: Secondary | ICD-10-CM | POA: Diagnosis not present

## 2019-10-24 DIAGNOSIS — I10 Essential (primary) hypertension: Secondary | ICD-10-CM

## 2019-10-24 NOTE — Patient Instructions (Signed)

## 2019-10-31 DIAGNOSIS — Z0001 Encounter for general adult medical examination with abnormal findings: Secondary | ICD-10-CM | POA: Diagnosis not present

## 2019-10-31 DIAGNOSIS — R7303 Prediabetes: Secondary | ICD-10-CM | POA: Diagnosis not present

## 2019-10-31 DIAGNOSIS — I2510 Atherosclerotic heart disease of native coronary artery without angina pectoris: Secondary | ICD-10-CM | POA: Diagnosis not present

## 2019-10-31 DIAGNOSIS — I10 Essential (primary) hypertension: Secondary | ICD-10-CM | POA: Diagnosis not present

## 2019-10-31 DIAGNOSIS — E7800 Pure hypercholesterolemia, unspecified: Secondary | ICD-10-CM | POA: Diagnosis not present

## 2019-10-31 DIAGNOSIS — Z79899 Other long term (current) drug therapy: Secondary | ICD-10-CM | POA: Diagnosis not present

## 2019-12-05 ENCOUNTER — Ambulatory Visit: Payer: PPO | Attending: Internal Medicine

## 2019-12-05 DIAGNOSIS — Z20828 Contact with and (suspected) exposure to other viral communicable diseases: Secondary | ICD-10-CM | POA: Diagnosis not present

## 2019-12-05 DIAGNOSIS — Z20822 Contact with and (suspected) exposure to covid-19: Secondary | ICD-10-CM

## 2019-12-07 LAB — NOVEL CORONAVIRUS, NAA: SARS-CoV-2, NAA: NOT DETECTED

## 2020-01-04 ENCOUNTER — Ambulatory Visit: Payer: PPO | Attending: Internal Medicine

## 2020-01-04 DIAGNOSIS — Z23 Encounter for immunization: Secondary | ICD-10-CM | POA: Insufficient documentation

## 2020-01-04 NOTE — Progress Notes (Signed)
   Covid-19 Vaccination Clinic  Name:  DUNCAN BLOYER    MRN: [id number] DOB: Aug 18, 1946  01/04/2020  Mr. Knotts was observed post Covid-19 immunization for 15 minutes without incidence. He was provided with Vaccine Information Sheet and instruction to access the V-Safe system.   Mr. Southwood was instructed to call 911 with any severe reactions post vaccine: [name] Difficulty breathing  . Swelling of your face and throat  . A fast heartbeat  . A bad rash all over your body  . Dizziness and weakness    Immunizations Administered    Name Date Dose VIS Date Route   Pfizer COVID-19 Vaccine 01/04/2020 11:03 AM 0.3 mL 11/23/2019 Intramuscular   Manufacturer: [place]   Lot: [id number]   [place]: [id number]

## 2020-01-23 ENCOUNTER — Ambulatory Visit: Payer: PPO | Attending: Internal Medicine

## 2020-01-23 DIAGNOSIS — Z23 Encounter for immunization: Secondary | ICD-10-CM

## 2020-01-23 NOTE — Progress Notes (Signed)
   Covid-19 Vaccination Clinic  Name:  Raymond Alexander    MRN: [id number] DOB: 02-Sep-1946  01/23/2020  [name] was observed post Covid-19 immunization for 15 minutes without incidence. He was provided with Vaccine Information Sheet and instruction to access the V-Safe system.   [name] was instructed to call 911 with any severe reactions post vaccine: [name] Difficulty breathing  . Swelling of your face and throat  . A fast heartbeat  . A bad rash all over your body  . Dizziness and weakness    Immunizations Administered    Name Date Dose VIS Date Route   Pfizer COVID-19 Vaccine 01/23/2020  8:05 AM 0.3 mL 11/23/2019 Intramuscular   Manufacturer: [place]   Lot: [id number]   [place]: [id number]

## 2020-02-14 IMAGING — CR DG CHEST 2V
2 series · 2 of 2 positions shown · non-contrast
Comparison: None.

CLINICAL DATA: Cough, wheezing

EXAM:
CHEST - 2 VIEW

[w chest pa]
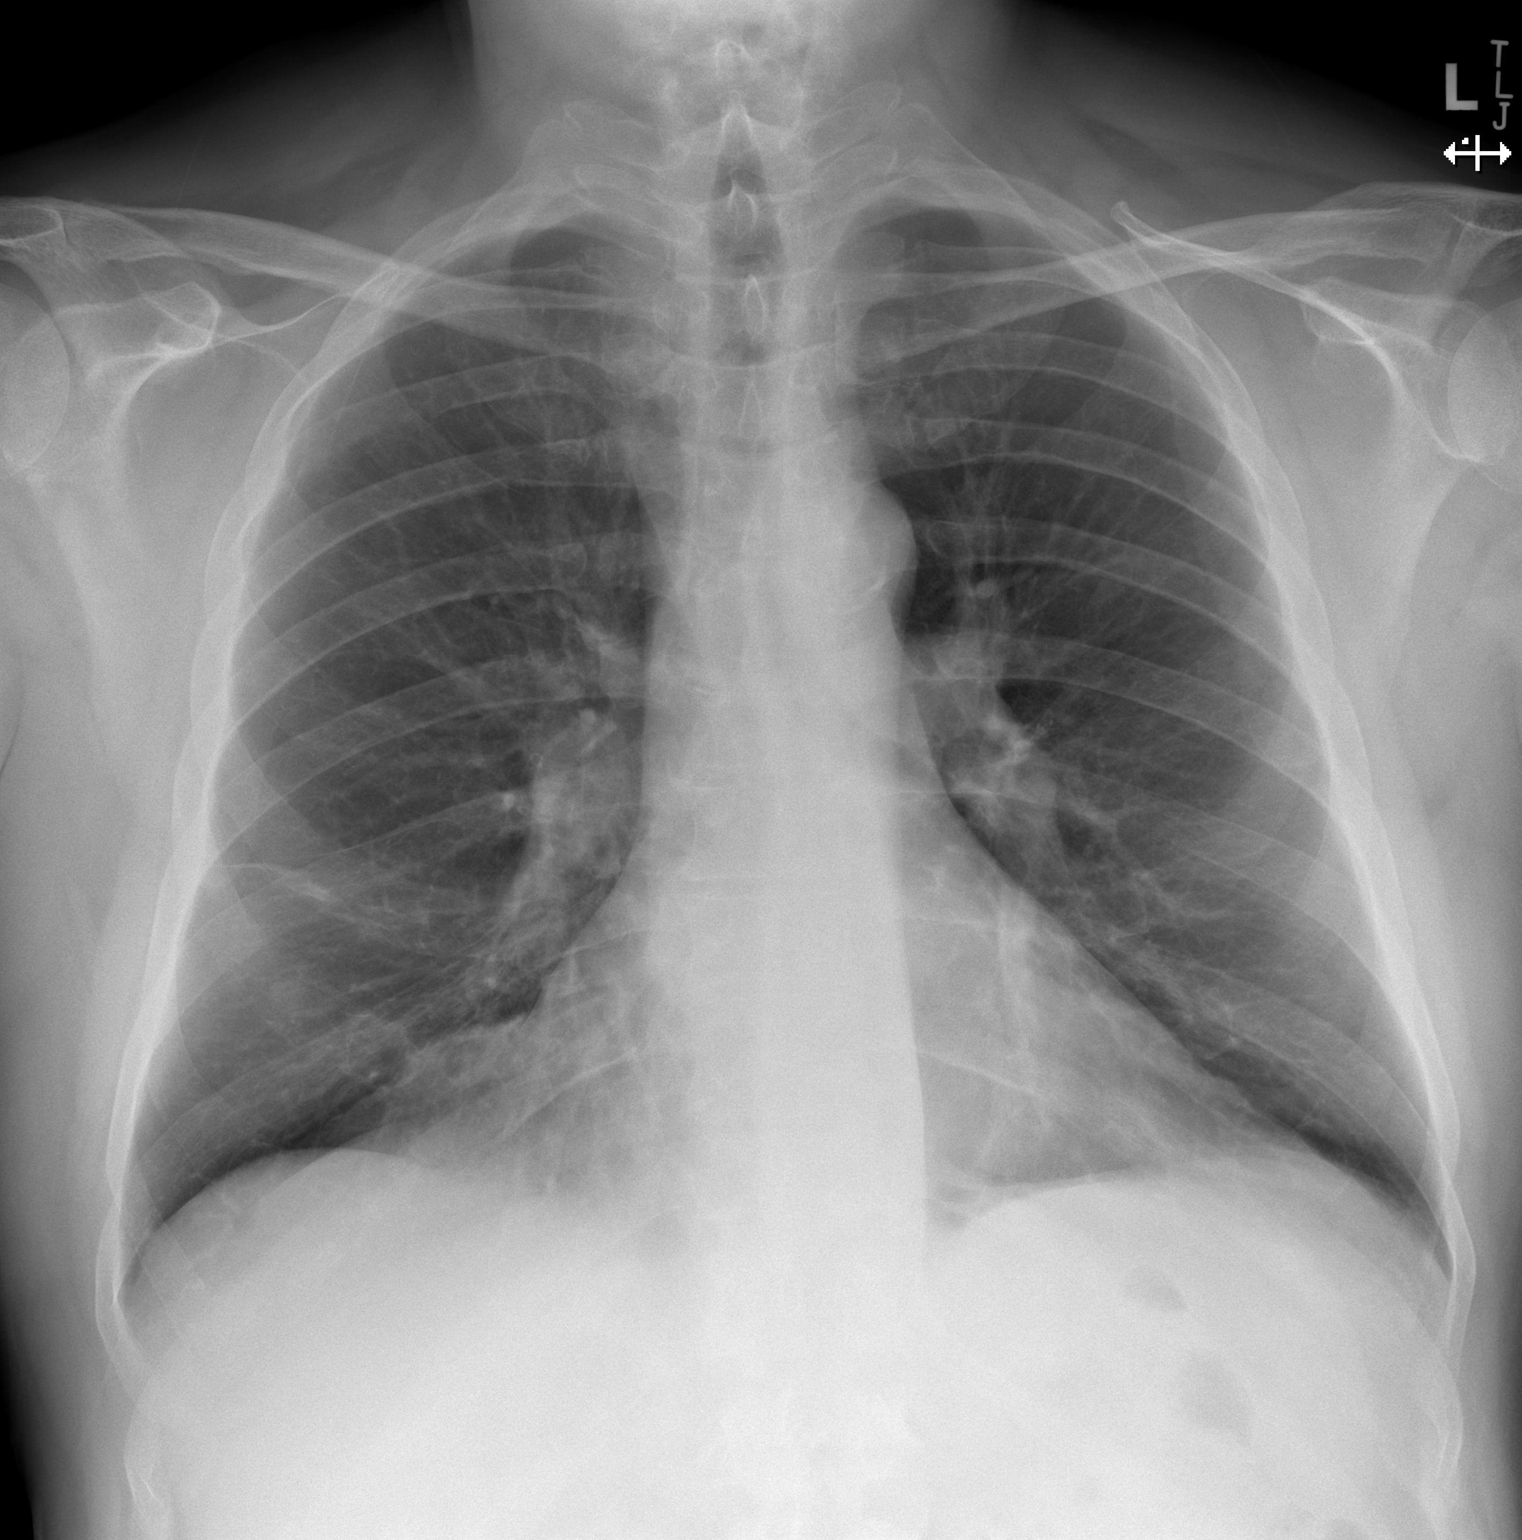

[w chest lat]
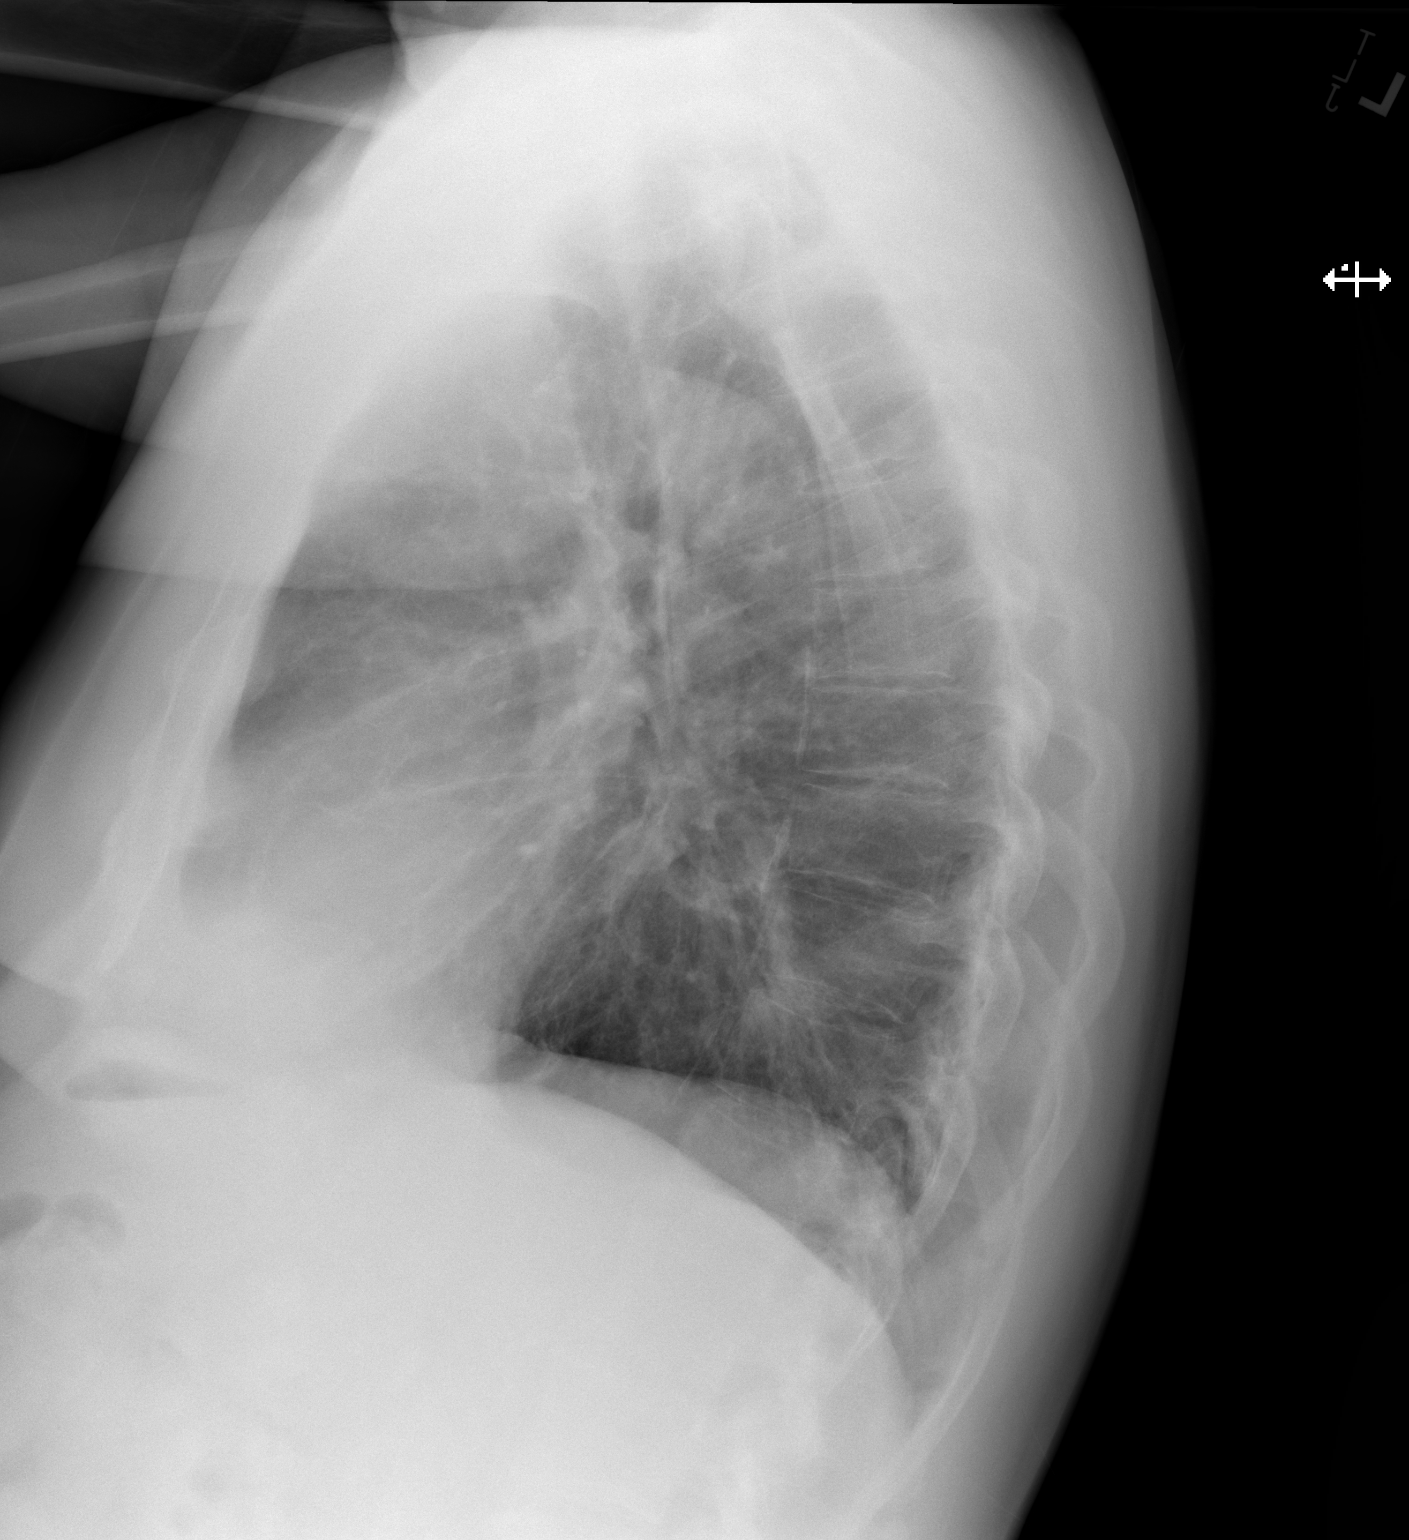

[2 of 2 positions shown; findings below may reference images not displayed]

FINDINGS: Heart is normal size. No confluent airspace opacities or effusions.
No acute bony abnormality.
IMPRESSION: No active cardiopulmonary disease.

## 2020-03-12 ENCOUNTER — Ambulatory Visit: Payer: PPO | Attending: Internal Medicine

## 2020-03-12 DIAGNOSIS — Z20822 Contact with and (suspected) exposure to covid-19: Secondary | ICD-10-CM

## 2020-03-13 LAB — NOVEL CORONAVIRUS, NAA: SARS-CoV-2, NAA: NOT DETECTED

## 2020-04-29 DIAGNOSIS — E7800 Pure hypercholesterolemia, unspecified: Secondary | ICD-10-CM | POA: Diagnosis not present

## 2020-04-29 DIAGNOSIS — R062 Wheezing: Secondary | ICD-10-CM | POA: Diagnosis not present

## 2020-04-29 DIAGNOSIS — M79651 Pain in right thigh: Secondary | ICD-10-CM | POA: Diagnosis not present

## 2020-04-29 DIAGNOSIS — R252 Cramp and spasm: Secondary | ICD-10-CM | POA: Diagnosis not present

## 2020-04-29 DIAGNOSIS — I2510 Atherosclerotic heart disease of native coronary artery without angina pectoris: Secondary | ICD-10-CM | POA: Diagnosis not present

## 2020-04-29 DIAGNOSIS — I10 Essential (primary) hypertension: Secondary | ICD-10-CM | POA: Diagnosis not present

## 2020-04-29 DIAGNOSIS — Z79899 Other long term (current) drug therapy: Secondary | ICD-10-CM | POA: Diagnosis not present

## 2020-04-29 DIAGNOSIS — R7309 Other abnormal glucose: Secondary | ICD-10-CM | POA: Diagnosis not present

## 2020-05-19 ENCOUNTER — Other Ambulatory Visit: Payer: Self-pay | Admitting: Family Medicine

## 2020-05-19 ENCOUNTER — Ambulatory Visit
Admission: RE | Admit: 2020-05-19 | Discharge: 2020-05-19 | Disposition: A | Discharge status: Home / Self Care | Payer: PPO | Source: Ambulatory Visit | Attending: Family Medicine | Admitting: Family Medicine

## 2020-05-19 DIAGNOSIS — M79651 Pain in right thigh: Secondary | ICD-10-CM

## 2020-05-19 DIAGNOSIS — M1611 Unilateral primary osteoarthritis, right hip: Secondary | ICD-10-CM | POA: Diagnosis not present

## 2020-08-25 DIAGNOSIS — Z1159 Encounter for screening for other viral diseases: Secondary | ICD-10-CM | POA: Diagnosis not present

## 2020-08-26 ENCOUNTER — Other Ambulatory Visit: Payer: PPO

## 2020-08-26 DIAGNOSIS — Z23 Encounter for immunization: Secondary | ICD-10-CM | POA: Diagnosis not present

## 2020-11-03 DIAGNOSIS — Z0001 Encounter for general adult medical examination with abnormal findings: Secondary | ICD-10-CM | POA: Diagnosis not present

## 2020-11-03 DIAGNOSIS — R7309 Other abnormal glucose: Secondary | ICD-10-CM | POA: Diagnosis not present

## 2020-11-03 DIAGNOSIS — I10 Essential (primary) hypertension: Secondary | ICD-10-CM | POA: Diagnosis not present

## 2020-11-03 DIAGNOSIS — R109 Unspecified abdominal pain: Secondary | ICD-10-CM | POA: Diagnosis not present

## 2020-11-03 DIAGNOSIS — E7800 Pure hypercholesterolemia, unspecified: Secondary | ICD-10-CM | POA: Diagnosis not present

## 2020-11-03 DIAGNOSIS — I2510 Atherosclerotic heart disease of native coronary artery without angina pectoris: Secondary | ICD-10-CM | POA: Diagnosis not present

## 2020-11-03 DIAGNOSIS — Z79899 Other long term (current) drug therapy: Secondary | ICD-10-CM | POA: Diagnosis not present

## 2020-11-04 NOTE — Progress Notes (Signed)
Cardiology Office Note:    Date:  11/05/2020   ID:  Raymond Alexander, DOB February 23, 1946, MRN [id number]  PCP:  Lujean Amel, MD  Cardiologist:  Sinclair Grooms, MD   Referring MD: Lujean Amel, MD   Chief Complaint  Patient presents with  . Coronary Artery Disease    History of Present Illness:    Raymond Alexander is a 74 y.o. male with a hx of a hx of CAD (mid RCA DES 2004; total occlusion of septal perforator with collaterals; 85% obtuse marginal), nonischemic exercise treadmill test 2017,hyperlipidemia , and primary hypertension.  He is doing well.  He has lost close to 40 pounds over the past year.  This is been purposeful with diet, biking, and swimming.  Getting close to 300 minutes of physical activity per week.  He has not had dizziness, palpitations, syncope, or presyncope.  There have been no neurological complaints.  Past Medical History:  Diagnosis Date  . ASCVD (arteriosclerotic cardiovascular disease)    3 vessel  . Clotting disorder ([place])   . Esophageal reflux   . Hyperlipidemia   . Hypertension     Past Surgical History:  Procedure Laterality Date  . PTCA  10/2003   mid RCA    Current Medications: Current Meds  Medication Sig  . aspirin 81 MG tablet Take 81 mg by mouth daily.  [name] atorvastatin (LIPITOR) 80 MG tablet Take 80 mg by mouth daily.  . Coenzyme Q10 (CO Q-10) 100 MG CAPS Take 300 mg by mouth daily.  [name] ezetimibe (ZETIA) 10 MG tablet Take 10 mg by mouth daily.  . fexofenadine (ALLEGRA) 180 MG tablet Take 180 mg by mouth daily as needed for allergies.   . fluticasone (FLONASE) 50 MCG/ACT nasal spray Place 1 spray into both nostrils daily.  [name] lisinopril (PRINIVIL,ZESTRIL) 10 MG tablet Take 10 mg by mouth daily.  . Mag Aspart-Potassium Aspart (POTASSIUM & MAGNESIUM ASPARTAT) 250-250 MG CAPS Take 1 tablet by mouth daily.  . Multiple Vitamin (MULTIVITAMIN) tablet Take 1 tablet by mouth daily.  . nitroGLYCERIN (NITROSTAT) 0.4 MG SL tablet Place 0.4 mg  under the tongue every 5 (five) minutes as needed for chest pain.  . Omega-3 Fatty Acids (FISH OIL) 1000 MG CAPS Take 2 capsules by mouth daily.  [name] OVER THE COUNTER MEDICATION Take 1 tablet by mouth daily. Med Name: PROTANDIM  . sildenafil (VIAGRA) 50 MG tablet Take 50 mg by mouth daily as needed for erectile dysfunction.     Allergies:   Patient has no known allergies.   Social History   Socioeconomic History  . Marital status: Married    Spouse name: Not on file  . Number of children: Not on file  . Years of education: Not on file  . Highest education level: Not on file  Occupational History  . Not on file  Tobacco Use  . Smoking status: Former Research scientist (life sciences)  . Smokeless tobacco: Never Used  Vaping Use  . Vaping Use: Never used  Substance and Sexual Activity  . Alcohol use: Yes    Alcohol/week: 1.0 standard drink    Types: 1 Glasses of wine per week    Comment: 1-2 per day  . Drug use: No  . Sexual activity: Not on file  Other Topics Concern  . Not on file  Social History Narrative  . Not on file   Social Determinants of Health   Financial Resource Strain:   . Difficulty of Paying Living Expenses: Not on  file  Food Insecurity:   . Worried About Charity fundraiser in the Last Year: Not on file  . Ran Out of Food in the Last Year: Not on file  Transportation Needs:   . Lack of Transportation (Medical): Not on file  . Lack of Transportation (Non-Medical): Not on file  Physical Activity:   . Days of Exercise per Week: Not on file  . Minutes of Exercise per Session: Not on file  Stress:   . Feeling of Stress : Not on file  Social Connections:   . Frequency of Communication with Friends and Family: Not on file  . Frequency of Social Gatherings with Friends and Family: Not on file  . Attends Religious Services: Not on file  . Active Member of Clubs or Organizations: Not on file  . Attends Archivist Meetings: Not on file  . Marital Status: Not on file      Family History: The patient's family history includes Cancer in his brother and mother; Diabetes in his sister; Healthy in his brother; Heart attack in his father.  ROS:   Please see the history of present illness.    Historically, he has had low heart rates into the 40s even back into his teenage years.  He had rheumatic fever as a child.  He denies palpitations and has not had syncope.  No chills or fever.  No arthritic complaints.  All other systems reviewed and are negative.  EKGs/Labs/Other Studies Reviewed:    The following studies were reviewed today: No new data.  Last imaging/functional data was 2017.  Heart rate and 2017 was 59 bpm prior to exercise.  EKG:  EKG Marked sinus bradycardia 44 bpm. Since prior tracing the heart rate is slower and inferior T wave abnormality has resolved.  Recent Labs: No results found for requested labs within last 8760 hours.  Recent Lipid Panel No results found for: CHOL, TRIG, HDL, CHOLHDL, VLDL, LDLCALC, LDLDIRECT  Physical Exam:    VS:  BP 125/73   Pulse (!) 44   Temp (!) 97.2 F (36.2 C)   Ht 5\' 10"  (1.778 m)   Wt 179 lb 3.2 oz (81.3 kg)   SpO2 94%   BMI 25.71 kg/m     Wt Readings from Last 3 Encounters:  11/05/20 179 lb 3.2 oz (81.3 kg)  10/24/19 215 lb 6.4 oz (97.7 kg)  08/29/17 224 lb 6.4 oz (101.8 kg)     GEN: Healthy-appearing. No acute distress HEENT: Normal NECK: No JVD. LYMPHATICS: No lymphadenopathy CARDIAC: Slow rate but RR without murmur, gallop, or edema. VASCULAR:  Normal Pulses. No bruits. RESPIRATORY:  Clear to auscultation without rales, wheezing or rhonchi  ABDOMEN: Soft, non-tender, non-distended, No pulsatile mass, MUSCULOSKELETAL: No deformity  SKIN: Warm and dry NEUROLOGIC:  Alert and oriented x 3 PSYCHIATRIC:  Normal affect   ASSESSMENT:    1. Atherosclerosis of native coronary artery of native heart without angina pectoris   2. Essential hypertension, benign   3. Other hyperlipidemia   4.  Slow heart rate   5. Educated about COVID-19 virus infection    PLAN:    In order of problems listed above:  1. Asymptomatic and controlling all risk factors quite nicely.  Globin A1c is 6.1.  Would be significantly higher without his effort to control weight and to exercise. 2. Excellent control currently.  Zestril 10 mg/day has a dual purpose to provide kidney protection as well as blood pressure control. 3. Continue Lipitor 80  mg/day.  Most recent LDL was 55.  Total cholesterol 115 performed 2 days ago. 4. Sinus bradycardia is profound but according to the patient present for years including back into his adolescence.  We will plan an exercise treadmill test to assess heart rate response with activity and as a general screen for ischemia. 5. Vaccinated and practicing social distancing.   Medication Adjustments/Labs and Tests Ordered: Current medicines are reviewed at length with the patient today.  Concerns regarding medicines are outlined above.  Orders Placed This Encounter  Procedures  . Exercise Tolerance Test  . EKG 12-Lead   No orders of the defined types were placed in this encounter.   Patient Instructions  Medication Instructions:  Your physician recommends that you continue on your current medications as directed. Please refer to the Current Medication list given to you today.  *If you need a refill on your cardiac medications before your next appointment, please call your pharmacy*   Lab Work: None If you have labs (blood work) drawn today and your tests are completely normal, you will receive your results only by: [name] MyChart Message (if you have MyChart) OR . A paper copy in the mail If you have any lab test that is abnormal or we need to change your treatment, we will call you to review the results.   Testing/Procedures: Your physician has requested that you have an exercise tolerance test. For further information please visit [email or web address]. Please also  follow instruction sheet, as given.   Follow-Up: At [facility], you and your health needs are our priority.  As part of our continuing mission to provide you with exceptional heart care, we have created designated Provider Care Teams.  These Care Teams include your primary Cardiologist (physician) and Advanced Practice Providers (APPs -  Physician Assistants and Nurse Practitioners) who all work together to provide you with the care you need, when you need it.  We recommend signing up for the patient portal called "MyChart".  Sign up information is provided on this After Visit Summary.  MyChart is used to connect with patients for Virtual Visits (Telemedicine).  Patients are able to view lab/test results, encounter notes, upcoming appointments, etc.  Non-urgent messages can be sent to your provider as well.   To learn more about what you can do with MyChart, go to [email or web address].    Your next appointment:   1 year(s)  The format for your next appointment:   In Person  Provider:   You may see Sinclair Grooms, MD or one of the following Advanced Practice Providers on your designated Care Team:    Truitt Merle, NP  Cecilie Kicks, NP  Kathyrn Drown, NP    Other Instructions      Signed, Sinclair Grooms, MD  11/05/2020 9:10 AM    [place]

## 2020-11-05 ENCOUNTER — Other Ambulatory Visit: Payer: Self-pay

## 2020-11-05 ENCOUNTER — Encounter: Payer: Self-pay | Admitting: Interventional Cardiology

## 2020-11-05 ENCOUNTER — Ambulatory Visit: Payer: PPO | Admitting: Interventional Cardiology

## 2020-11-05 VITALS — BP 125/73 | HR 44 | Temp 97.2°F | Ht 70.0 in | Wt 179.2 lb

## 2020-11-05 DIAGNOSIS — I2510 Atherosclerotic heart disease of native coronary artery without angina pectoris: Secondary | ICD-10-CM

## 2020-11-05 DIAGNOSIS — I10 Essential (primary) hypertension: Secondary | ICD-10-CM | POA: Diagnosis not present

## 2020-11-05 DIAGNOSIS — R001 Bradycardia, unspecified: Secondary | ICD-10-CM

## 2020-11-05 DIAGNOSIS — E7849 Other hyperlipidemia: Secondary | ICD-10-CM | POA: Diagnosis not present

## 2020-11-05 DIAGNOSIS — Z7189 Other specified counseling: Secondary | ICD-10-CM | POA: Diagnosis not present

## 2020-11-05 NOTE — Patient Instructions (Signed)
Medication Instructions:  Your physician recommends that you continue on your current medications as directed. Please refer to the Current Medication list given to you today.  *If you need a refill on your cardiac medications before your next appointment, please call your pharmacy*   Lab Work: None If you have labs (blood work) drawn today and your tests are completely normal, you will receive your results only by: [name] MyChart Message (if you have MyChart) OR . A paper copy in the mail If you have any lab test that is abnormal or we need to change your treatment, we will call you to review the results.   Testing/Procedures: Your physician has requested that you have an exercise tolerance test. For further information please visit [email or web address]. Please also follow instruction sheet, as given.   Follow-Up: At [facility], you and your health needs are our priority.  As part of our continuing mission to provide you with exceptional heart care, we have created designated Provider Care Teams.  These Care Teams include your primary Cardiologist (physician) and Advanced Practice Providers (APPs -  Physician Assistants and Nurse Practitioners) who all work together to provide you with the care you need, when you need it.  We recommend signing up for the patient portal called "MyChart".  Sign up information is provided on this After Visit Summary.  MyChart is used to connect with patients for Virtual Visits (Telemedicine).  Patients are able to view lab/test results, encounter notes, upcoming appointments, etc.  Non-urgent messages can be sent to your provider as well.   To learn more about what you can do with MyChart, go to [email or web address].    Your next appointment:   1 year(s)  The format for your next appointment:   In Person  Provider:   You may see Sinclair Grooms, MD or one of the following Advanced Practice Providers on your designated Care Team:    Truitt Merle,  NP  Cecilie Kicks, NP  Kathyrn Drown, NP    Other Instructions

## 2020-11-18 NOTE — Addendum Note (Signed)
Addended by: Loren Racer on: 11/18/2020 11:00 AM   Modules accepted: Orders

## 2020-11-24 NOTE — Addendum Note (Signed)
Addended by: Belva Crome on: 11/24/2020 09:50 AM   Modules accepted: Orders

## 2020-11-28 ENCOUNTER — Other Ambulatory Visit (HOSPITAL_COMMUNITY)
Admission: RE | Admit: 2020-11-28 | Discharge: 2020-11-28 | Disposition: A | Discharge status: Home / Self Care | Payer: PPO | Source: Ambulatory Visit | Attending: Interventional Cardiology | Admitting: Interventional Cardiology

## 2020-11-28 DIAGNOSIS — Z01812 Encounter for preprocedural laboratory examination: Secondary | ICD-10-CM | POA: Insufficient documentation

## 2020-11-28 DIAGNOSIS — Z20822 Contact with and (suspected) exposure to covid-19: Secondary | ICD-10-CM | POA: Diagnosis not present

## 2020-11-29 LAB — SARS CORONAVIRUS 2 (TAT 6-24 HRS): SARS Coronavirus 2: NEGATIVE

## 2020-12-02 ENCOUNTER — Other Ambulatory Visit: Payer: Self-pay

## 2020-12-02 ENCOUNTER — Ambulatory Visit (INDEPENDENT_AMBULATORY_CARE_PROVIDER_SITE_OTHER): Payer: PPO

## 2020-12-02 DIAGNOSIS — H2513 Age-related nuclear cataract, bilateral: Secondary | ICD-10-CM | POA: Diagnosis not present

## 2020-12-02 DIAGNOSIS — I2510 Atherosclerotic heart disease of native coronary artery without angina pectoris: Secondary | ICD-10-CM | POA: Diagnosis not present

## 2020-12-02 DIAGNOSIS — H25013 Cortical age-related cataract, bilateral: Secondary | ICD-10-CM | POA: Diagnosis not present

## 2020-12-02 DIAGNOSIS — H18413 Arcus senilis, bilateral: Secondary | ICD-10-CM | POA: Diagnosis not present

## 2020-12-02 DIAGNOSIS — H35373 Puckering of macula, bilateral: Secondary | ICD-10-CM | POA: Diagnosis not present

## 2020-12-02 DIAGNOSIS — H2511 Age-related nuclear cataract, right eye: Secondary | ICD-10-CM | POA: Diagnosis not present

## 2020-12-02 DIAGNOSIS — H25043 Posterior subcapsular polar age-related cataract, bilateral: Secondary | ICD-10-CM | POA: Diagnosis not present

## 2020-12-02 LAB — EXERCISE TOLERANCE TEST
Estimated workload: 10.1 METS
Exercise duration (min): 9 min
Exercise duration (sec): 0 s
MPHR: 144 {beats}/min
Peak HR: 127 {beats}/min
Percent HR: 88 %
RPE: 17
Rest HR: 45 {beats}/min

## 2021-03-09 DIAGNOSIS — H2511 Age-related nuclear cataract, right eye: Secondary | ICD-10-CM | POA: Diagnosis not present

## 2021-03-10 DIAGNOSIS — H2512 Age-related nuclear cataract, left eye: Secondary | ICD-10-CM | POA: Diagnosis not present

## 2021-03-30 DIAGNOSIS — H52202 Unspecified astigmatism, left eye: Secondary | ICD-10-CM | POA: Diagnosis not present

## 2021-03-30 DIAGNOSIS — H2512 Age-related nuclear cataract, left eye: Secondary | ICD-10-CM | POA: Diagnosis not present

## 2021-04-07 DIAGNOSIS — K529 Noninfective gastroenteritis and colitis, unspecified: Secondary | ICD-10-CM | POA: Diagnosis not present

## 2021-07-08 DIAGNOSIS — H3581 Retinal edema: Secondary | ICD-10-CM | POA: Diagnosis not present

## 2021-07-10 DIAGNOSIS — H3581 Retinal edema: Secondary | ICD-10-CM | POA: Diagnosis not present

## 2021-07-13 ENCOUNTER — Encounter (INDEPENDENT_AMBULATORY_CARE_PROVIDER_SITE_OTHER): Payer: Self-pay | Admitting: Ophthalmology

## 2021-07-13 ENCOUNTER — Other Ambulatory Visit: Payer: Self-pay

## 2021-07-13 ENCOUNTER — Ambulatory Visit (INDEPENDENT_AMBULATORY_CARE_PROVIDER_SITE_OTHER): Payer: PPO | Admitting: Ophthalmology

## 2021-07-13 DIAGNOSIS — H539 Unspecified visual disturbance: Secondary | ICD-10-CM | POA: Diagnosis not present

## 2021-07-13 DIAGNOSIS — Z961 Presence of intraocular lens: Secondary | ICD-10-CM | POA: Diagnosis not present

## 2021-07-13 DIAGNOSIS — H35033 Hypertensive retinopathy, bilateral: Secondary | ICD-10-CM

## 2021-07-13 DIAGNOSIS — I10 Essential (primary) hypertension: Secondary | ICD-10-CM | POA: Diagnosis not present

## 2021-07-13 DIAGNOSIS — H3581 Retinal edema: Secondary | ICD-10-CM

## 2021-07-13 NOTE — Progress Notes (Addendum)
[facility] Note  07/13/2021     CHIEF COMPLAINT Patient presents for Retina Evaluation   HISTORY OF PRESENT ILLNESS: Raymond Alexander is a 75 y.o. male who presents to the clinic today for:   HPI     Retina Evaluation   In both eyes.  This started 3 weeks ago.  Duration of 1 week.  Associated Symptoms Flashes, Floaters and Blind Spot.        Comments   New patient retinal referral from Dr. Nicki Reaper for retinal edema OU. Pt states last Tuesday the pt saw Dr. Nicki Reaper who saw fluid in retina OU. Pt reports horizontal line green flashes started OD but have ceased. He did notice blurred spots in OS during eye test. OS vision seems blurrier than OD. He had cataract sx OU earlier this year. Pt reports having a booster shot on 06/18/21. He did just complete a round of prednisone and is still taking valacyclovir prescribed by Dr. Nicki Reaper. Diagnosed w/ HTN around 2004.       Last edited by Bernarda Caffey, MD on 07/13/2021 12:14 PM.    Pt is here on the referral of Dr. Nicki Reaper for concern of retinal edema OU, pt states he started to see horizontal lime green fol in his right eye only, he states they have since stopped, he states his left eye is blurrier than his right eye, he states when he was reading the eye chart, the 2 letters int he middle were blurry when checking vision with Dr. Nicki Reaper, but they seem to be clearer today, pt is leaving Wednesday to travel through [place] for 3 weeks, pt had cataract sx OU with Dr. Talbert Forest, pt received his second covid booster shot on 06/18/21, he states his first 3 shots were [place], but this one was Moderna, pt has hypertension, but no other significant medical hx, pt had a stent put in his heart in 2004, pt had rheumatic fever as a baby  Referring physician: Macarthur Critchley, [place] [place],  [place] [address]  HISTORICAL INFORMATION:   Selected notes from the MEDICAL RECORD NUMBER Referred by Dr. Nicki Reaper for concern of retinal edema  OU LEE:  Ocular Hx- PMH-    CURRENT MEDICATIONS: No current outpatient medications on file. (Ophthalmic Drugs)   No current facility-administered medications for this visit. (Ophthalmic Drugs)   Current Outpatient Medications (Other)  Medication Sig   aspirin 81 MG tablet Take 81 mg by mouth daily.   atorvastatin (LIPITOR) 80 MG tablet Take 80 mg by mouth daily.   Coenzyme Q10 (CO Q-10) 100 MG CAPS Take 300 mg by mouth daily.   ezetimibe (ZETIA) 10 MG tablet Take 10 mg by mouth daily.   fexofenadine (ALLEGRA) 180 MG tablet Take 180 mg by mouth daily as needed for allergies.    fluticasone (FLONASE) 50 MCG/ACT nasal spray Place 1 spray into both nostrils daily.   lisinopril (PRINIVIL,ZESTRIL) 10 MG tablet Take 10 mg by mouth daily.   Mag Aspart-Potassium Aspart (POTASSIUM & MAGNESIUM ASPARTAT) 250-250 MG CAPS Take 1 tablet by mouth daily.   Multiple Vitamin (MULTIVITAMIN) tablet Take 1 tablet by mouth daily.   nitroGLYCERIN (NITROSTAT) 0.4 MG SL tablet Place 0.4 mg under the tongue every 5 (five) minutes as needed for chest pain.   Omega-3 Fatty Acids (FISH OIL) 1000 MG CAPS Take 2 capsules by mouth daily.   OVER THE COUNTER MEDICATION Take 1 tablet by mouth daily. Med Name: Shirley Friar  sildenafil (VIAGRA) 50 MG tablet Take 50 mg by mouth daily as needed for erectile dysfunction.   No current facility-administered medications for this visit. (Other)      REVIEW OF SYSTEMS: ROS   Positive for: Cardiovascular, Eyes Last edited by Kingsley Spittle, COT on 07/13/2021  8:18 AM.       ALLERGIES No Known Allergies  PAST MEDICAL HISTORY Past Medical History:  Diagnosis Date   ASCVD (arteriosclerotic cardiovascular disease)    3 vessel   Clotting disorder ([place])    Esophageal reflux    Hyperlipidemia    Hypertension    Past Surgical History:  Procedure Laterality Date   CATARACT EXTRACTION     PTCA  10/14/2003   mid RCA    FAMILY HISTORY Family History  Problem  Relation Age of Onset   Heart attack Father    Cancer Mother    Diabetes Sister    Cancer Brother    Healthy Brother     SOCIAL HISTORY Social History   Tobacco Use   Smoking status: Former   Smokeless tobacco: Never  Scientific laboratory technician Use: Never used  Substance Use Topics   Alcohol use: Yes    Alcohol/week: 1.0 standard drink    Types: 1 Glasses of wine per week    Comment: 1-2 per day   Drug use: No         OPHTHALMIC EXAM:  Base Eye Exam     Visual Acuity (Snellen - Linear)       Right Left   Dist cc 20/20 20/20    Correction: Glasses         Tonometry (Tonopen, 8:28 AM)       Right Left   Pressure 14 13         Pupils       Dark Light Shape React APD   Right 3 2 Round Minimal None   Left 3 2 Round Minimal None         Visual Fields (Counting fingers)       Left Right    Full Full         Extraocular Movement       Right Left    Full, Ortho Full, Ortho         Neuro/Psych     Oriented x3: Yes   Mood/Affect: Normal         Dilation     Both eyes: 1.0% Mydriacyl, 2.5% Phenylephrine @ 8:28 AM           Slit Lamp and Fundus Exam     Slit Lamp Exam       Right Left   Lids/Lashes Normal Normal   Conjunctiva/Sclera White and quiet White and quiet   Cornea arcus, well healed cataract wound, trace PEE arcus, well healed cataract wound, trace PEE   Anterior Chamber Deep and quiet Deep and quiet   Iris Round and dilated Round and dilated   Lens PC IOL in good position Toric PC IOL in good position with marks at 0530   Vitreous Mild Vitreous syneresis, no cell or pigment, low lying Posterior vitreous detachment Mild Vitreous syneresis, no cell or pigment         Fundus Exam       Right Left   Disc Pink and Sharp, peripapillary edema, ?peripapillary CNV around 1200 Pink and Sharp, peripapillary edema, ?CNV ST disc   C/D Ratio 0.5 0.4   Macula Good foveal reflex,  nasal edema, fine drusen, no heme Good foveal  reflex, mild edema nasal macula, no heme   Vessels mild attenuation, mild Copper wiring, mild AV crossing changes mild attenuation, mild Copper wiring, mild AV crossing changes   Periphery Attached, No heme  Attached, No heme            Refraction     Wearing Rx       Sphere Cylinder Axis Add   Right [place] +0.25 060 +2.25   Left +0.25 Sphere  +2.25         Manifest Refraction       Sphere Cylinder Axis Dist VA   Right +0.25 +0.25 153 20/20   Left [place] +0.50 015 20/20            IMAGING AND PROCEDURES  Imaging and Procedures for 07/13/2021  OCT, Retina - OU - Both Eyes       Right Eye Quality was good. Central Foveal Thickness: 286. Progression has no prior data. Findings include normal foveal contour, intraretinal fluid, no SRF (Focal peripapillary IRF nasal macula sparing fovea, focal shallow SRF superior to disc).   Left Eye Quality was good. Central Foveal Thickness: 275. Progression has no prior data. Findings include normal foveal contour, intraretinal fluid, no SRF (Focal peripapillary IRF nasal macula sparing fovea, shallow SRF temporal to disc).   Notes *Images captured and stored on drive  Diagnosis / Impression:  [id number] peripapillary IRF nasal macula sparing fovea, focal shallow SRF superior to disc OS: Focal peripapillary IRF nasal macula sparing fovea, shallow SRF temporal to disc  Clinical management:  See below  Abbreviations: NFP - Normal foveal profile. CME - cystoid macular edema. PED - pigment epithelial detachment. IRF - intraretinal fluid. SRF - subretinal fluid. EZ - ellipsoid zone. ERM - epiretinal membrane. ORA - outer retinal atrophy. ORT - outer retinal tubulation. SRHM - subretinal hyper-reflective material. IRHM - intraretinal hyper-reflective material              ASSESSMENT/PLAN:    ICD-10-CM   1. Retinal edema  H35.81 OCT, Retina - OU - Both Eyes    2. Visual disturbances  H53.9     3. Essential hypertension  I10      4. Hypertensive retinopathy of both eyes  H35.033     5. Pseudophakia, both eyes  Z96.1       1,2. Retinal edema with visual disturbances OU  - pt with 1 wk history of decreased vision OS and isolated light green flashes OD -- flashes OD resolved, blurriness OS improving  - BCVA 20/20  - exam and OCT shows focal peripapillary edema, just outside of fovea w/ ?peripapillary CNV  - unclear etiology as no significant drusen to point to ARMD; ?idiopathic; post-COVID vaccine? -- pt received 2nd booster 7.7.22  - unable to do FA today, but will follow closely  - discussed findings and prognosis  - no retinal/ophthalmic intervention indicated at this time  - Amsler grid given to closely monitor for vision changes  - f/u August 22, DFE, OCT, FA  3,4. Hypertensive retinopathy OU - discussed importance of tight BP control - monitor  5. Pseudophakia OU  - s/p CE/IOL (Dr. Talbert Forest, 2022)  - IOLs in good position, doing well  - monitor   Ophthalmic Meds Ordered this visit:  No orders of the defined types were placed in this encounter.      Return for f/u August 22, retinal edema OU, DFE, OCT, FA.  There are no  Patient Instructions on file for this visit.   Explained the diagnoses, plan, and follow up with the patient and they expressed understanding.  Patient expressed understanding of the importance of proper follow up care.   This document serves as a record of services personally performed by Gardiner Sleeper, MD, PhD. It was created on their behalf by San Jetty. Owens Shark, OA an ophthalmic technician. The creation of this record is the provider's dictation and/or activities during the visit.    Electronically signed by: San Jetty. Owens Shark, [place] 08.01.2022 12:29 PM   Gardiner Sleeper, M.D., Ph.D. Diseases & Surgery of the Retina and Vitreous [facility]  I have reviewed the above documentation for accuracy and completeness, and I agree with the above. Gardiner Sleeper, M.D., Ph.D. 07/13/21 12:29 PM    Abbreviations: M myopia (nearsighted); A astigmatism; H hyperopia (farsighted); P presbyopia; Mrx spectacle prescription;  CTL contact lenses; OD right eye; OS left eye; OU both eyes  XT exotropia; ET esotropia; PEK punctate epithelial keratitis; PEE punctate epithelial erosions; DES dry eye syndrome; MGD meibomian gland dysfunction; ATs artificial tears; PFAT's preservative free artificial tears; [place] nuclear sclerotic cataract; PSC posterior subcapsular cataract; ERM epi-retinal membrane; PVD posterior vitreous detachment; RD retinal detachment; DM diabetes mellitus; DR diabetic retinopathy; NPDR non-proliferative diabetic retinopathy; PDR proliferative diabetic retinopathy; CSME clinically significant macular edema; DME diabetic macular edema; dbh dot blot hemorrhages; CWS cotton wool spot; POAG primary open angle glaucoma; C/D cup-to-disc ratio; HVF humphrey visual field; GVF goldmann visual field; OCT optical coherence tomography; IOP intraocular pressure; BRVO Branch retinal vein occlusion; CRVO central retinal vein occlusion; CRAO central retinal artery occlusion; BRAO branch retinal artery occlusion; RT retinal tear; SB scleral buckle; PPV pars plana vitrectomy; VH Vitreous hemorrhage; PRP panretinal laser photocoagulation; IVK intravitreal kenalog; VMT vitreomacular traction; MH Macular hole;  NVD neovascularization of the disc; NVE neovascularization elsewhere; AREDS age related eye disease study; ARMD age related macular degeneration; POAG primary open angle glaucoma; EBMD epithelial/anterior basement membrane dystrophy; ACIOL anterior chamber intraocular lens; IOL intraocular lens; PCIOL posterior chamber intraocular lens; Phaco/IOL phacoemulsification with intraocular lens placement; [place] photorefractive keratectomy; LASIK laser assisted in situ keratomileusis; HTN hypertension; DM diabetes mellitus; COPD chronic obstructive pulmonary disease

## 2021-07-31 NOTE — Progress Notes (Signed)
[facility] Note  08/04/2021     CHIEF COMPLAINT Patient presents for Retina Follow Up   HISTORY OF PRESENT ILLNESS: Raymond Alexander is a 75 y.o. male who presents to the clinic today for:   HPI     Retina Follow Up   Patient presents with  Other.  In both eyes.  This started 3 weeks ago.  I, the attending physician,  performed the HPI with the patient and updated documentation appropriately.        Comments   Patient her for 3 weeks retina follow up for retinal edema OU. Patient states vision being a little bit better. Using amsler grid. No eye pain.       Last edited by Bernarda Caffey, MD on 08/09/2021  1:21 AM.    Pt states symptoms are improving, he is no longer seeing green fol, he has been monitoring his amsler grid and has not noticed any distortion  Referring physician: Macarthur Critchley, [address]. [place],  [place] [address]  HISTORICAL INFORMATION:   Selected notes from the MEDICAL RECORD NUMBER Referred by Dr. Nicki Reaper for concern of retinal edema OU LEE:  Ocular Hx- PMH-    CURRENT MEDICATIONS: No current outpatient medications on file. (Ophthalmic Drugs)   No current facility-administered medications for this visit. (Ophthalmic Drugs)   Current Outpatient Medications (Other)  Medication Sig   aspirin 81 MG tablet Take 81 mg by mouth daily.   atorvastatin (LIPITOR) 80 MG tablet Take 80 mg by mouth daily.   Coenzyme Q10 (CO Q-10) 100 MG CAPS Take 300 mg by mouth daily.   ezetimibe (ZETIA) 10 MG tablet Take 10 mg by mouth daily.   fexofenadine (ALLEGRA) 180 MG tablet Take 180 mg by mouth daily as needed for allergies.    fluticasone (FLONASE) 50 MCG/ACT nasal spray Place 1 spray into both nostrils daily.   lisinopril (PRINIVIL,ZESTRIL) 10 MG tablet Take 10 mg by mouth daily.   Mag Aspart-Potassium Aspart (POTASSIUM & MAGNESIUM ASPARTAT) 250-250 MG CAPS Take 1 tablet by mouth daily.   Multiple Vitamin (MULTIVITAMIN) tablet  Take 1 tablet by mouth daily.   nitroGLYCERIN (NITROSTAT) 0.4 MG SL tablet Place 0.4 mg under the tongue every 5 (five) minutes as needed for chest pain.   Omega-3 Fatty Acids (FISH OIL) 1000 MG CAPS Take 2 capsules by mouth daily.   OVER THE COUNTER MEDICATION Take 1 tablet by mouth daily. Med Name: PROTANDIM   sildenafil (VIAGRA) 50 MG tablet Take 50 mg by mouth daily as needed for erectile dysfunction.   No current facility-administered medications for this visit. (Other)      REVIEW OF SYSTEMS: ROS   Positive for: Cardiovascular, Eyes Last edited by Theodore Demark, COA on 08/04/2021  8:49 AM.        ALLERGIES No Known Allergies  PAST MEDICAL HISTORY Past Medical History:  Diagnosis Date   ASCVD (arteriosclerotic cardiovascular disease)    3 vessel   Clotting disorder ([place])    Esophageal reflux    Hyperlipidemia    Hypertension    Past Surgical History:  Procedure Laterality Date   CATARACT EXTRACTION     PTCA  10/14/2003   mid RCA    FAMILY HISTORY Family History  Problem Relation Age of Onset   Heart attack Father    Cancer Mother    Diabetes Sister    Cancer Brother    Healthy Brother     SOCIAL HISTORY  Social History   Tobacco Use   Smoking status: Former   Smokeless tobacco: Never  Scientific laboratory technician Use: Never used  Substance Use Topics   Alcohol use: Yes    Alcohol/week: 1.0 standard drink    Types: 1 Glasses of wine per week    Comment: 1-2 per day   Drug use: No         OPHTHALMIC EXAM:  Base Eye Exam     Visual Acuity (Snellen - Linear)       Right Left   Dist cc 20/20 -2 20/20    Correction: Glasses         Tonometry (Tonopen, 8:46 AM)       Right Left   Pressure 13 12         Pupils       Dark Light Shape React APD   Right 3 2 Round Brisk None   Left 3 2 Round Brisk None         Visual Fields (Counting fingers)       Left Right    Full Full         Extraocular Movement       Right Left     Full, Ortho Full, Ortho         Neuro/Psych     Oriented x3: Yes   Mood/Affect: Normal         Dilation     Both eyes: 1.0% Mydriacyl, 2.5% Phenylephrine @ 8:46 AM           Slit Lamp and Fundus Exam     Slit Lamp Exam       Right Left   Lids/Lashes Normal Normal   Conjunctiva/Sclera White and quiet White and quiet   Cornea arcus, well healed cataract wound, trace PEE arcus, well healed cataract wound, trace PEE   Anterior Chamber Deep and quiet Deep and quiet   Iris Round and dilated Round and dilated   Lens PC IOL in good position Toric PC IOL in good position with marks at 0530   Vitreous Mild Vitreous syneresis, no cell or pigment, low lying Posterior vitreous detachment, Weiss ring Mild Vitreous syneresis, no cell or pigment         Fundus Exam       Right Left   Disc Pink and Sharp, peripapillary edema, ?peripapillary CNV ST to disc Pink and Sharp, peripapillary edema, ?CNV ST disc   C/D Ratio 0.5 0.4   Macula Good foveal reflex, nasal edema - improved, fine drusen, no heme Good foveal reflex, mild edema nasal macula - improved, no heme   Vessels mild attenuation, mild Copper wiring, mild AV crossing changes mild attenuation, mild Copper wiring, mild AV crossing changes   Periphery Attached, No heme  Attached, No heme            Refraction     Wearing Rx       Sphere Cylinder Axis Add   Right [place] +0.25 060 +2.25   Left +0.25 Sphere  +2.25            IMAGING AND PROCEDURES  Imaging and Procedures for 08/04/2021  OCT, Retina - OU - Both Eyes       Right Eye Quality was good. Central Foveal Thickness: 287. Progression has improved. Findings include normal foveal contour, intraretinal fluid, subretinal fluid, pigment epithelial detachment, retinal drusen (Interval improvement in nasal IRF/SRF).   Left Eye Quality was good. Central Foveal Thickness:  275. Progression has improved. Findings include normal foveal contour, intraretinal fluid,  subretinal fluid (Interval improvement in IRF/SRF).   Notes *Images captured and stored on drive  Diagnosis / Impression:  [id number] improvement in nasal IRF/SRF OS: Interval improvement in IRF/SRF  Clinical management:  See below  Abbreviations: NFP - Normal foveal profile. CME - cystoid macular edema. PED - pigment epithelial detachment. IRF - intraretinal fluid. SRF - subretinal fluid. EZ - ellipsoid zone. ERM - epiretinal membrane. ORA - outer retinal atrophy. ORT - outer retinal tubulation. SRHM - subretinal hyper-reflective material. IRHM - intraretinal hyper-reflective material      Fluorescein Angiography Optos (Transit OD)       Right Eye Progression has no prior data. Early phase findings include leakage, staining. Mid/Late phase findings include leakage, staining (Mild peripapillary staining and leakage).   Left Eye Progression has no prior data. Early phase findings include staining. Mid/Late phase findings include staining, leakage (Mild peripapillary staining and leakage).   Notes **Images stored on drive**  Impression: Mild peripapillary staining and leakage OU               ASSESSMENT/PLAN:    ICD-10-CM   1. Retinal edema  H35.81 OCT, Retina - OU - Both Eyes    2. Visual disturbances  H53.9     3. Essential hypertension  I10     4. Hypertensive retinopathy of both eyes  H35.033 Fluorescein Angiography Optos (Transit OD)    5. Pseudophakia, both eyes  Z96.1       1,2. Retinal edema with visual disturbances OU  - pt with 1 wk history of decreased vision OS and isolated light green flashes OD -- flashes OD resolved, blurriness OS improving  - BCVA 20/20  - exam and OCT shows interval improvement in nasal IRF/SRF OU  - FA (08.23.22) shows mild peripapillary staining and leakage OU  - unclear etiology as no significant drusen to point to ARMD; ?mild idiopathic CNV; post-COVID vaccine? -- pt received 2nd booster 7.7.22  - discussed findings  and prognosis  - no retinal/ophthalmic intervention indicated at this time  - Amsler grid given to closely monitor for vision changes  - f/u 6-8 weeks, DFE, OCT  3,4. Hypertensive retinopathy OU - discussed importance of tight BP control - monitor  5. Pseudophakia OU  - s/p CE/IOL (Dr. Talbert Forest, 2022)  - IOLs in good position, doing well  - monitor   Ophthalmic Meds Ordered this visit:  No orders of the defined types were placed in this encounter.      Return for f/u 6-8 weeks, retinal edema OU, DFE, OCT.  There are no Patient Instructions on file for this visit.   Explained the diagnoses, plan, and follow up with the patient and they expressed understanding.  Patient expressed understanding of the importance of proper follow up care.   This document serves as a record of services personally performed by Gardiner Sleeper, MD, PhD. It was created on their behalf by San Jetty. Owens Shark, OA an ophthalmic technician. The creation of this record is the provider's dictation and/or activities during the visit.    Electronically signed by: San Jetty. Owens Shark, [place] 08.19.2022 1:23 AM  Gardiner Sleeper, M.D., Ph.D. Diseases & Surgery of the Retina and Vitreous [facility]  I have reviewed the above documentation for accuracy and completeness, and I agree with the above. Gardiner Sleeper, M.D., Ph.D. 08/09/21 1:24 AM   Abbreviations: M myopia (nearsighted); A astigmatism; H  hyperopia (farsighted); P presbyopia; Mrx spectacle prescription;  CTL contact lenses; OD right eye; OS left eye; OU both eyes  XT exotropia; ET esotropia; PEK punctate epithelial keratitis; PEE punctate epithelial erosions; DES dry eye syndrome; MGD meibomian gland dysfunction; ATs artificial tears; PFAT's preservative free artificial tears; [place] nuclear sclerotic cataract; PSC posterior subcapsular cataract; ERM epi-retinal membrane; PVD posterior vitreous detachment; RD retinal detachment; DM diabetes  mellitus; DR diabetic retinopathy; NPDR non-proliferative diabetic retinopathy; PDR proliferative diabetic retinopathy; CSME clinically significant macular edema; DME diabetic macular edema; dbh dot blot hemorrhages; CWS cotton wool spot; POAG primary open angle glaucoma; C/D cup-to-disc ratio; HVF humphrey visual field; GVF goldmann visual field; OCT optical coherence tomography; IOP intraocular pressure; BRVO Branch retinal vein occlusion; CRVO central retinal vein occlusion; CRAO central retinal artery occlusion; BRAO branch retinal artery occlusion; RT retinal tear; SB scleral buckle; PPV pars plana vitrectomy; VH Vitreous hemorrhage; PRP panretinal laser photocoagulation; IVK intravitreal kenalog; VMT vitreomacular traction; MH Macular hole;  NVD neovascularization of the disc; NVE neovascularization elsewhere; AREDS age related eye disease study; ARMD age related macular degeneration; POAG primary open angle glaucoma; EBMD epithelial/anterior basement membrane dystrophy; ACIOL anterior chamber intraocular lens; IOL intraocular lens; PCIOL posterior chamber intraocular lens; Phaco/IOL phacoemulsification with intraocular lens placement; [place] photorefractive keratectomy; LASIK laser assisted in situ keratomileusis; HTN hypertension; DM diabetes mellitus; COPD chronic obstructive pulmonary disease

## 2021-08-04 ENCOUNTER — Encounter (INDEPENDENT_AMBULATORY_CARE_PROVIDER_SITE_OTHER): Payer: Self-pay | Admitting: Ophthalmology

## 2021-08-04 ENCOUNTER — Ambulatory Visit (INDEPENDENT_AMBULATORY_CARE_PROVIDER_SITE_OTHER): Payer: PPO | Admitting: Ophthalmology

## 2021-08-04 ENCOUNTER — Other Ambulatory Visit: Payer: Self-pay

## 2021-08-04 DIAGNOSIS — H35033 Hypertensive retinopathy, bilateral: Secondary | ICD-10-CM | POA: Diagnosis not present

## 2021-08-04 DIAGNOSIS — H539 Unspecified visual disturbance: Secondary | ICD-10-CM

## 2021-08-04 DIAGNOSIS — Z961 Presence of intraocular lens: Secondary | ICD-10-CM | POA: Diagnosis not present

## 2021-08-04 DIAGNOSIS — H3581 Retinal edema: Secondary | ICD-10-CM

## 2021-08-04 DIAGNOSIS — I10 Essential (primary) hypertension: Secondary | ICD-10-CM

## 2021-08-09 ENCOUNTER — Encounter (INDEPENDENT_AMBULATORY_CARE_PROVIDER_SITE_OTHER): Payer: Self-pay | Admitting: Ophthalmology

## 2021-08-28 DIAGNOSIS — Z23 Encounter for immunization: Secondary | ICD-10-CM | POA: Diagnosis not present

## 2021-09-14 NOTE — Progress Notes (Signed)
[facility] Note  09/15/2021     CHIEF COMPLAINT Patient presents for Retina Follow Up   HISTORY OF PRESENT ILLNESS: Raymond Alexander is a 75 y.o. male who presents to the clinic today for:   HPI     Retina Follow Up   Patient presents with  Other.  In both eyes.  Duration of 6 weeks.  Since onset it is stable.  I, the attending physician,  performed the HPI with the patient and updated documentation appropriately.        Comments   6 week follow up Retinal edema OU- He stated having a recurrence of FOLs last week OD.  Has improved some. Vision appears stable.        Last edited by Bernarda Caffey, MD on 09/18/2021  8:14 AM.     Pt states symptoms are improving, he is no longer seeing green fol, he has been monitoring his amsler grid and has not noticed any distortion  Referring physician: Lujean Amel, MD [address],  [place] [address]  HISTORICAL INFORMATION:   Selected notes from the MEDICAL RECORD NUMBER Referred by Dr. Nicki Reaper for concern of retinal edema OU    CURRENT MEDICATIONS: No current outpatient medications on file. (Ophthalmic Drugs)   No current facility-administered medications for this visit. (Ophthalmic Drugs)   Current Outpatient Medications (Other)  Medication Sig   aspirin 81 MG tablet Take 81 mg by mouth daily.   atorvastatin (LIPITOR) 80 MG tablet Take 80 mg by mouth daily.   Coenzyme Q10 (CO Q-10) 100 MG CAPS Take 300 mg by mouth daily.   ezetimibe (ZETIA) 10 MG tablet Take 10 mg by mouth daily.   fexofenadine (ALLEGRA) 180 MG tablet Take 180 mg by mouth daily as needed for allergies.    fluticasone (FLONASE) 50 MCG/ACT nasal spray Place 1 spray into both nostrils daily.   lisinopril (PRINIVIL,ZESTRIL) 10 MG tablet Take 10 mg by mouth daily.   Mag Aspart-Potassium Aspart (POTASSIUM & MAGNESIUM ASPARTAT) 250-250 MG CAPS Take 1 tablet by mouth daily.   Multiple Vitamin (MULTIVITAMIN) tablet  Take 1 tablet by mouth daily.   nitroGLYCERIN (NITROSTAT) 0.4 MG SL tablet Place 0.4 mg under the tongue every 5 (five) minutes as needed for chest pain.   Omega-3 Fatty Acids (FISH OIL) 1000 MG CAPS Take 2 capsules by mouth daily.   OVER THE COUNTER MEDICATION Take 1 tablet by mouth daily. Med Name: PROTANDIM   sildenafil (VIAGRA) 50 MG tablet Take 50 mg by mouth daily as needed for erectile dysfunction.   No current facility-administered medications for this visit. (Other)   REVIEW OF SYSTEMS: ROS   Positive for: Cardiovascular, Eyes Negative for: Constitutional, Gastrointestinal, Neurological, Skin, Genitourinary, Musculoskeletal, HENT, Endocrine, Respiratory, Psychiatric, Allergic/Imm, Heme/Lymph Last edited by Leonie Douglas, COA on 09/15/2021  9:30 AM.     ALLERGIES No Known Allergies  PAST MEDICAL HISTORY Past Medical History:  Diagnosis Date   ASCVD (arteriosclerotic cardiovascular disease)    3 vessel   Clotting disorder ([place])    Esophageal reflux    Hyperlipidemia    Hypertension    Past Surgical History:  Procedure Laterality Date   CATARACT EXTRACTION     PTCA  10/14/2003   mid RCA   FAMILY HISTORY Family History  Problem Relation Age of Onset   Heart attack Father    Cancer Mother    Diabetes Sister    Cancer Brother  Healthy Brother    SOCIAL HISTORY Social History   Tobacco Use   Smoking status: Former   Smokeless tobacco: Never  Scientific laboratory technician Use: Never used  Substance Use Topics   Alcohol use: Yes    Alcohol/week: 1.0 standard drink    Types: 1 Glasses of wine per week    Comment: 1-2 per day   Drug use: No       OPHTHALMIC EXAM:  Base Eye Exam     Visual Acuity (Snellen - Linear)       Right Left   Dist cc 20/20- 20/20-    Correction: Glasses         Tonometry (Tonopen, 9:35 AM)       Right Left   Pressure 12 13         Pupils       Dark Light Shape React APD   Right 3 2 Round Brisk None   Left 3 2 Round  Brisk None         Visual Fields (Counting fingers)       Left Right    Full Full         Extraocular Movement       Right Left    Full Full         Neuro/Psych     Oriented x3: Yes   Mood/Affect: Normal         Dilation     Both eyes: 1.0% Mydriacyl, 2.5% Phenylephrine @ 9:35 AM           Slit Lamp and Fundus Exam     Slit Lamp Exam       Right Left   Lids/Lashes Normal Normal   Conjunctiva/Sclera White and quiet White and quiet   Cornea arcus, well healed cataract wound, trace PEE arcus, well healed cataract wound, trace PEE   Anterior Chamber Deep and quiet Deep and quiet   Iris Round and dilated Round and dilated   Lens PC IOL in good position Toric PC IOL in good position with marks at 0530   Vitreous Mild Vitreous syneresis, no cell or pigment, low lying Posterior vitreous detachment, Weiss ring Mild Vitreous syneresis, no cell or pigment         Fundus Exam       Right Left   Disc Pink and Sharp, peripapillary edema, ?peripapillary CNV ST to disc -- improved Pink and Sharp, peripapillary edema slight improvment, ?CNV ST disc   C/D Ratio 0.5 0.4   Macula Good foveal reflex, nasal edema - improved, fine drusen, no heme Good foveal reflex, mild edema nasal macula - improved, no heme   Vessels mild attenuation, mild Copper wiring, mild AV crossing changes mild attenuation, mild Copper wiring, mild AV crossing changes   Periphery Attached, No heme , no RT/RD Attached, No heme            Refraction     Wearing Rx       Sphere Cylinder Axis Add   Right [place] +0.25 060 +2.25   Left +0.25 Sphere  +2.25           IMAGING AND PROCEDURES  Imaging and Procedures for 09/15/2021  OCT, Retina - OU - Both Eyes       Right Eye Quality was good. Central Foveal Thickness: 286. Progression has improved. Findings include normal foveal contour, intraretinal fluid, pigment epithelial detachment, retinal drusen , no SRF (Interval improvement in  nasal IRF, interval  resolution in SRF).   Left Eye Quality was good. Central Foveal Thickness: 273. Progression has improved. Findings include normal foveal contour, intraretinal fluid, subretinal fluid (Interval improvement in IRF/SRF).   Notes *Images captured and stored on drive  Diagnosis / Impression:  OD: Interval improvement in nasal IRF,  interval resolution in SRF OS: Interval improvement in IRF/SRF  Clinical management:  See below  Abbreviations: NFP - Normal foveal profile. CME - cystoid macular edema. PED - pigment epithelial detachment. IRF - intraretinal fluid. SRF - subretinal fluid. EZ - ellipsoid zone. ERM - epiretinal membrane. ORA - outer retinal atrophy. ORT - outer retinal tubulation. SRHM - subretinal hyper-reflective material. IRHM - intraretinal hyper-reflective material            ASSESSMENT/PLAN:    ICD-10-CM   1. Retinal edema  H35.81 OCT, Retina - OU - Both Eyes    2. Visual disturbances  H53.9     3. Essential hypertension  I10     4. Hypertensive retinopathy of both eyes  H35.033     5. Pseudophakia, both eyes  Z96.1     1,2. Retinal edema with visual disturbances OU  - pt with 1 wk history of decreased vision OS and isolated light green flashes OD -- flashes OD resolved, blurriness OS improving  - 10.04.22 small recurrent episode of flashes OD last week.    - BCVA 20/20  - exam and OCT shows OD: Interval improvement in nasal IRF, interval resolution in SRF; OS: Interval improvement in nasal IRF/SRF  - FA (08.23.22) shows mild peripapillary staining and leakage OU  - unclear etiology as no significant drusen to point to ARMD; ?mild idiopathic CNV; post-COVID vaccine? -- pt received 2nd booster 7.7.22  - discussed findings and prognosis  - no retinal/ophthalmic intervention indicated at this time  - Amsler grid given to closely monitor for vision changes  - f/u 6 weeks, DFE, OCT  3,4. Hypertensive retinopathy OU - discussed importance of  tight BP control - monitor  5. Pseudophakia OU  - s/p CE/IOL (Dr. Talbert Forest, 2022)  - IOLs in good position, doing well  - monitor  Ophthalmic Meds Ordered this visit:  No orders of the defined types were placed in this encounter.    Return in 6 weeks (on 10/27/2021) for DFE, OCT.  There are no Patient Instructions on file for this visit.   Explained the diagnoses, plan, and follow up with the patient and they expressed understanding.  Patient expressed understanding of the importance of proper follow up care.   This document serves as a record of services personally performed by Gardiner Sleeper, MD, PhD. It was created on their behalf by San Jetty. Owens Shark, OA an ophthalmic technician. The creation of this record is the provider's dictation and/or activities during the visit.    Electronically signed by: San Jetty. Owens Shark, [place] 10.03.2022 8:17 AM  This document serves as a record of services personally performed by Gardiner Sleeper, MD, PhD. It was created on their behalf by Leonie Douglas, an ophthalmic technician. The creation of this record is the provider's dictation and/or activities during the visit.    Electronically signed by: Leonie Douglas COA, 09/18/21  8:17 AM  Gardiner Sleeper, M.D., Ph.D. Diseases & Surgery of the Retina and Vitreous [facility]  I have reviewed the above documentation for accuracy and completeness, and I agree with the above. Gardiner Sleeper, M.D., Ph.D. 09/18/21 8:17 AM  Abbreviations: M myopia (nearsighted);  A astigmatism; H hyperopia (farsighted); P presbyopia; Mrx spectacle prescription;  CTL contact lenses; OD right eye; OS left eye; OU both eyes  XT exotropia; ET esotropia; PEK punctate epithelial keratitis; PEE punctate epithelial erosions; DES dry eye syndrome; MGD meibomian gland dysfunction; ATs artificial tears; PFAT's preservative free artificial tears; [place] nuclear sclerotic cataract; PSC posterior subcapsular cataract; ERM  epi-retinal membrane; PVD posterior vitreous detachment; RD retinal detachment; DM diabetes mellitus; DR diabetic retinopathy; NPDR non-proliferative diabetic retinopathy; PDR proliferative diabetic retinopathy; CSME clinically significant macular edema; DME diabetic macular edema; dbh dot blot hemorrhages; CWS cotton wool spot; POAG primary open angle glaucoma; C/D cup-to-disc ratio; HVF humphrey visual field; GVF goldmann visual field; OCT optical coherence tomography; IOP intraocular pressure; BRVO Branch retinal vein occlusion; CRVO central retinal vein occlusion; CRAO central retinal artery occlusion; BRAO branch retinal artery occlusion; RT retinal tear; SB scleral buckle; PPV pars plana vitrectomy; VH Vitreous hemorrhage; PRP panretinal laser photocoagulation; IVK intravitreal kenalog; VMT vitreomacular traction; MH Macular hole;  NVD neovascularization of the disc; NVE neovascularization elsewhere; AREDS age related eye disease study; ARMD age related macular degeneration; POAG primary open angle glaucoma; EBMD epithelial/anterior basement membrane dystrophy; ACIOL anterior chamber intraocular lens; IOL intraocular lens; PCIOL posterior chamber intraocular lens; Phaco/IOL phacoemulsification with intraocular lens placement; [place] photorefractive keratectomy; LASIK laser assisted in situ keratomileusis; HTN hypertension; DM diabetes mellitus; COPD chronic obstructive pulmonary disease

## 2021-09-15 ENCOUNTER — Other Ambulatory Visit: Payer: Self-pay

## 2021-09-15 ENCOUNTER — Ambulatory Visit (INDEPENDENT_AMBULATORY_CARE_PROVIDER_SITE_OTHER): Payer: PPO | Admitting: Ophthalmology

## 2021-09-15 DIAGNOSIS — H539 Unspecified visual disturbance: Secondary | ICD-10-CM | POA: Diagnosis not present

## 2021-09-15 DIAGNOSIS — I10 Essential (primary) hypertension: Secondary | ICD-10-CM

## 2021-09-15 DIAGNOSIS — H35033 Hypertensive retinopathy, bilateral: Secondary | ICD-10-CM

## 2021-09-15 DIAGNOSIS — Z961 Presence of intraocular lens: Secondary | ICD-10-CM | POA: Diagnosis not present

## 2021-09-15 DIAGNOSIS — H3581 Retinal edema: Secondary | ICD-10-CM

## 2021-09-18 ENCOUNTER — Encounter (INDEPENDENT_AMBULATORY_CARE_PROVIDER_SITE_OTHER): Payer: Self-pay | Admitting: Ophthalmology

## 2021-09-21 DIAGNOSIS — H1030 Unspecified acute conjunctivitis, unspecified eye: Secondary | ICD-10-CM | POA: Diagnosis not present

## 2021-10-18 IMAGING — CR DG HIP (WITH OR WITHOUT PELVIS) 2-3V*R*
2 series · 2 of 2 positions shown · non-contrast
Comparison: None.

CLINICAL DATA: Right hip pain for 2 months, no known injury,
initial encounter

EXAM:
DG HIP (WITH OR WITHOUT PELVIS) 2V RIGHT

[t hip ap right]
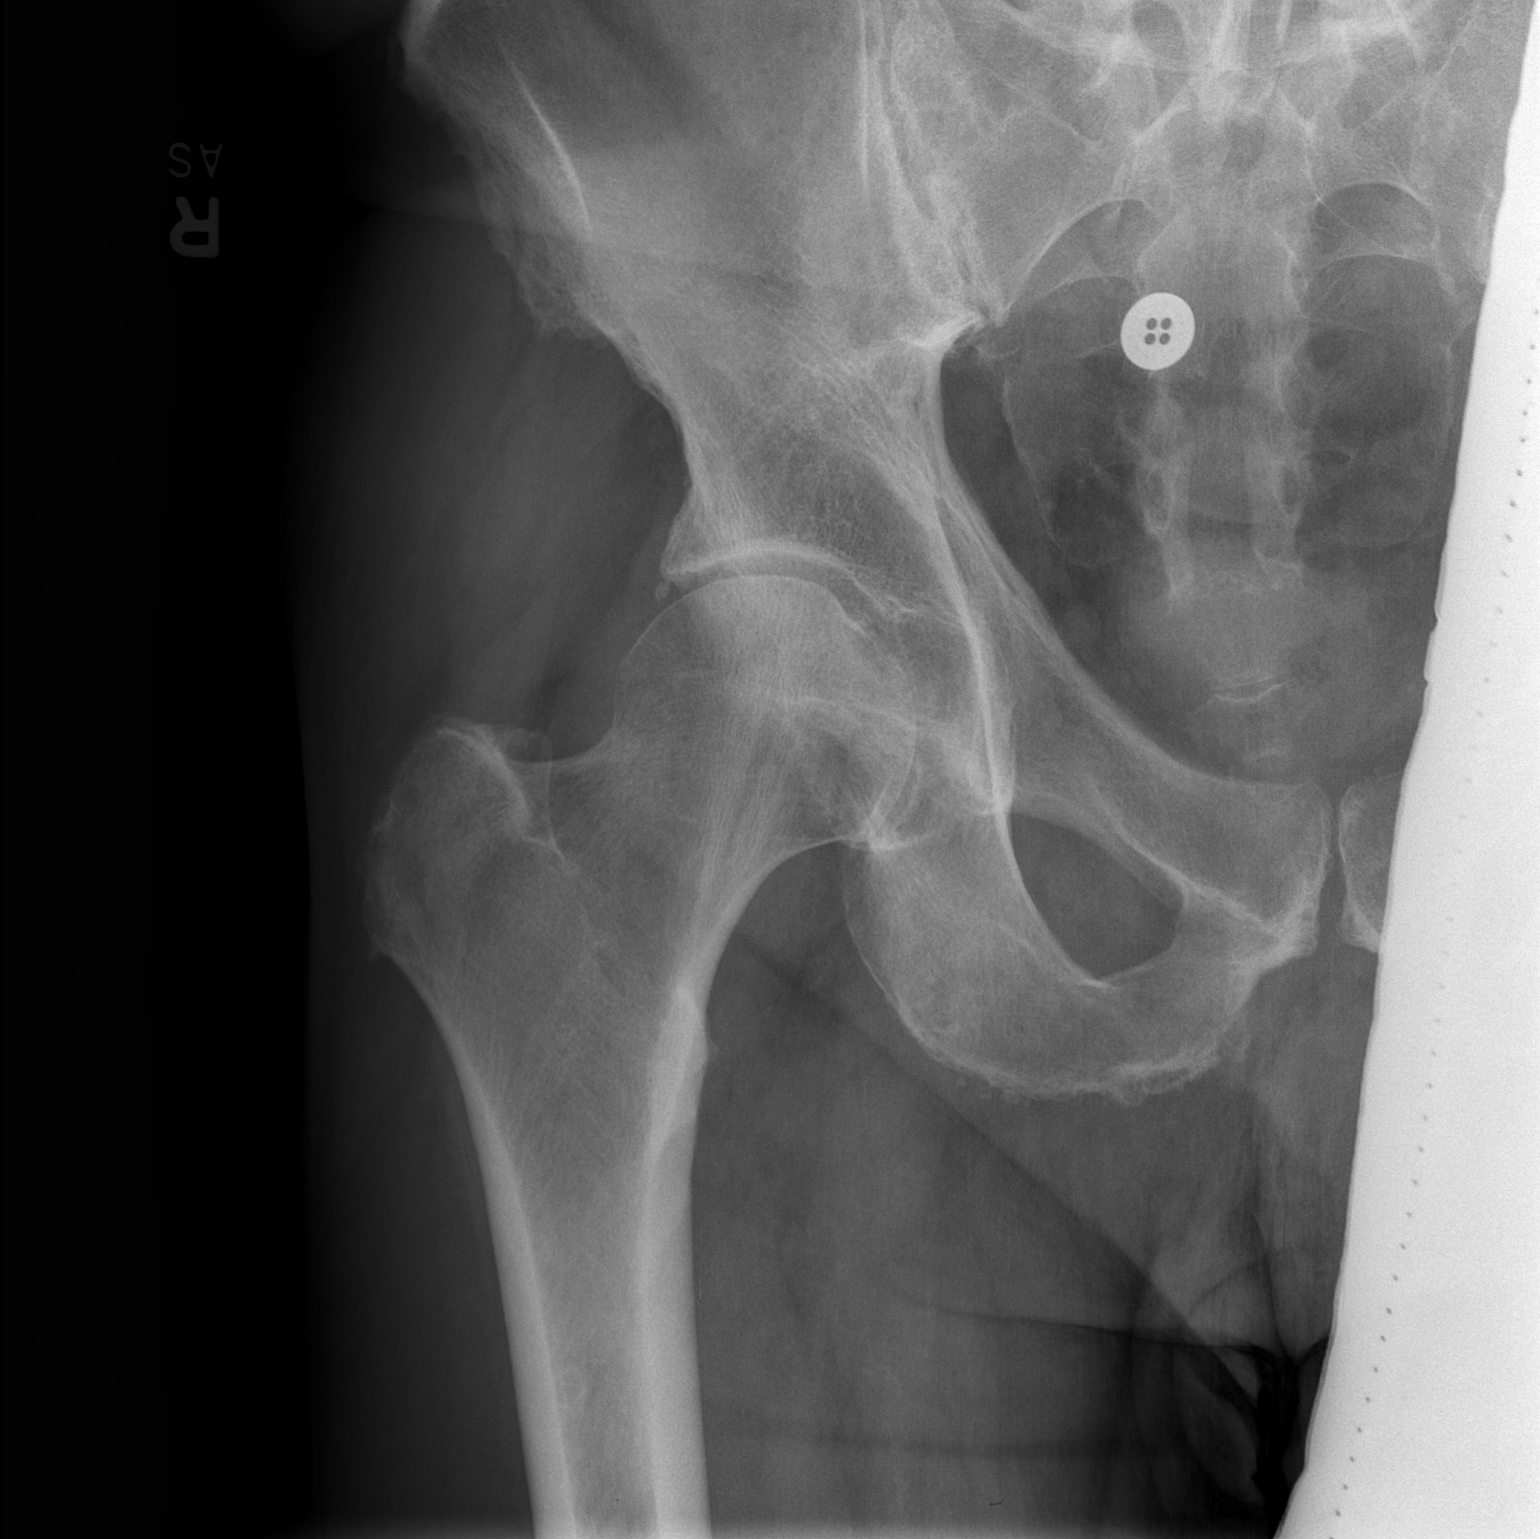

[t hip frog leg right]
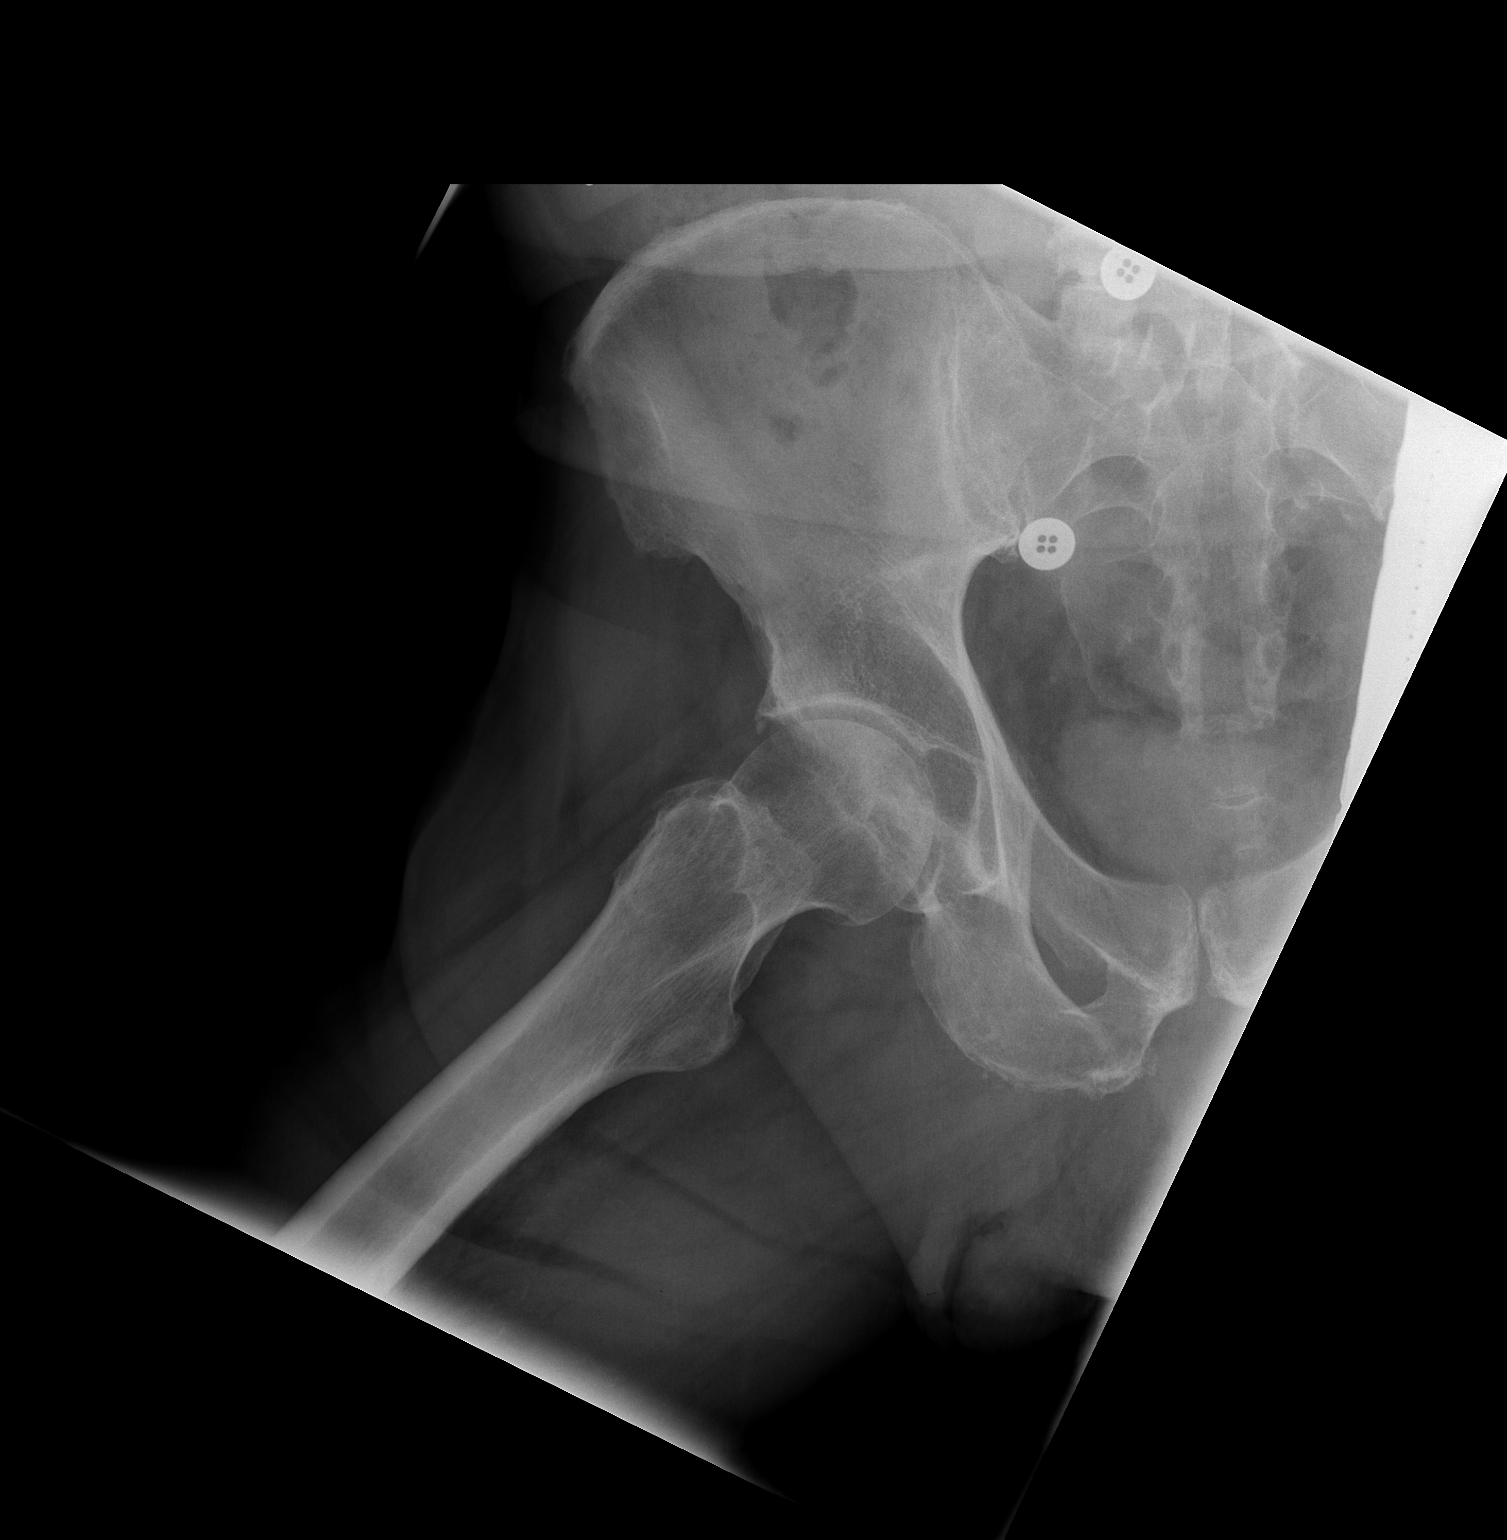

[2 of 2 positions shown; findings below may reference images not displayed]

FINDINGS: Degenerative changes of the right hip joint are seen. No acute
fracture or dislocation is noted. No soft tissue abnormality is
seen.
IMPRESSION: Mild degenerative change without acute abnormality.

## 2021-10-26 NOTE — Progress Notes (Addendum)
[facility] Note  10/27/2021     CHIEF COMPLAINT Patient presents for Retina Follow Up  HISTORY OF PRESENT ILLNESS: Raymond Alexander is a 75 y.o. male who presents to the clinic today for:   HPI     Retina Follow Up   Patient presents with  Other.  In both eyes.  Duration of 6 weeks.  Since onset it is stable.  I, the attending physician,  performed the HPI with the patient and updated documentation appropriately.        Comments   6 week follow up Ret edema OU- No new problems or changes in vision.       Last edited by Bernarda Caffey, MD on 10/27/2021  8:19 AM.    Pt states no change in vision, pt received another Covid booster shot about 6-8 weeks ago    Referring physician: Macarthur Critchley, [address] [place],  [place] [address]  HISTORICAL INFORMATION:   Selected notes from the MEDICAL RECORD NUMBER Referred by Dr. Nicki Reaper for concern of retinal edema OU   CURRENT MEDICATIONS: No current outpatient medications on file. (Ophthalmic Drugs)   No current facility-administered medications for this visit. (Ophthalmic Drugs)   Current Outpatient Medications (Other)  Medication Sig   aspirin 81 MG tablet Take 81 mg by mouth daily.   atorvastatin (LIPITOR) 80 MG tablet Take 80 mg by mouth daily.   Coenzyme Q10 (CO Q-10) 100 MG CAPS Take 300 mg by mouth daily.   ezetimibe (ZETIA) 10 MG tablet Take 10 mg by mouth daily.   fexofenadine (ALLEGRA) 180 MG tablet Take 180 mg by mouth daily as needed for allergies.    fluticasone (FLONASE) 50 MCG/ACT nasal spray Place 1 spray into both nostrils daily.   lisinopril (PRINIVIL,ZESTRIL) 10 MG tablet Take 10 mg by mouth daily.   Mag Aspart-Potassium Aspart (POTASSIUM & MAGNESIUM ASPARTAT) 250-250 MG CAPS Take 1 tablet by mouth daily.   Multiple Vitamin (MULTIVITAMIN) tablet Take 1 tablet by mouth daily.   nitroGLYCERIN (NITROSTAT) 0.4 MG SL tablet Place 0.4 mg under the tongue every 5 (five) minutes  as needed for chest pain.   Omega-3 Fatty Acids (FISH OIL) 1000 MG CAPS Take 2 capsules by mouth daily.   OVER THE COUNTER MEDICATION Take 1 tablet by mouth daily. Med Name: PROTANDIM   sildenafil (VIAGRA) 50 MG tablet Take 50 mg by mouth daily as needed for erectile dysfunction.   No current facility-administered medications for this visit. (Other)   REVIEW OF SYSTEMS: ROS   Positive for: Cardiovascular, Eyes Negative for: Constitutional, Gastrointestinal, Neurological, Skin, Genitourinary, Musculoskeletal, HENT, Endocrine, Respiratory, Psychiatric, Allergic/Imm, Heme/Lymph Last edited by Leonie Douglas, COA on 10/27/2021  7:54 AM.      ALLERGIES No Known Allergies  PAST MEDICAL HISTORY Past Medical History:  Diagnosis Date   ASCVD (arteriosclerotic cardiovascular disease)    3 vessel   Clotting disorder ([place])    Esophageal reflux    Hyperlipidemia    Hypertension    Past Surgical History:  Procedure Laterality Date   CATARACT EXTRACTION     PTCA  10/14/2003   mid RCA   FAMILY HISTORY Family History  Problem Relation Age of Onset   Heart attack Father    Cancer Mother    Diabetes Sister    Cancer Brother    Healthy Brother    SOCIAL HISTORY Social History   Tobacco Use   Smoking status: Former   Smokeless tobacco:  Never  Vaping Use   Vaping Use: Never used  Substance Use Topics   Alcohol use: Yes    Alcohol/week: 1.0 standard drink    Types: 1 Glasses of wine per week    Comment: 1-2 per day   Drug use: No       OPHTHALMIC EXAM:  Base Eye Exam     Visual Acuity (Snellen - Linear)       Right Left   Dist cc 20/20 20/20 -2         Tonometry (Tonopen, 7:59 AM)       Right Left   Pressure 11 12         Pupils       Dark Light Shape React APD   Right 3 2 Round Brisk None   Left 3 2 Round Brisk None         Visual Fields (Counting fingers)       Left Right    Full Full         Extraocular Movement       Right Left    Full  Full         Neuro/Psych     Oriented x3: Yes   Mood/Affect: Normal         Dilation     Both eyes: 1.0% Mydriacyl, 2.5% Phenylephrine @ 7:59 AM           Slit Lamp and Fundus Exam     Slit Lamp Exam       Right Left   Lids/Lashes Normal Normal   Conjunctiva/Sclera White and quiet White and quiet   Cornea arcus, well healed cataract wound, trace PEE arcus, well healed cataract wound, trace PEE   Anterior Chamber Deep and quiet Deep and quiet   Iris Round and dilated Round and dilated   Lens PC IOL in good position Toric PC IOL in good position with marks at 0530   Vitreous Mild Vitreous syneresis, no cell or pigment, low lying Posterior vitreous detachment, Weiss ring Mild Vitreous syneresis, no cell or pigment         Fundus Exam       Right Left   Disc Pink and Sharp, peripapillary edema, ?peripapillary CNV ST to disc -- persistent, no heme Pink and Sharp, peripapillary edema slight increased, CNV ST disc, +SVP, no heme   C/D Ratio 0.5 0.4   Macula Good foveal reflex, nasal cystic changes/edema - persistnt, fine drusen, no heme Good foveal reflex, mild edema nasal macula - slightly increased, no heme   Vessels mild attenuation, mild Copper wiring, mild AV crossing changes mild attenuation, mild Copper wiring, mild AV crossing changes   Periphery Attached, No heme , no RT/RD Attached, No heme            Refraction     Wearing Rx       Sphere Cylinder Axis Add   Right [place] +0.25 060 +2.25   Left +0.25 Sphere  +2.25           IMAGING AND PROCEDURES  Imaging and Procedures for 10/27/2021  OCT, Retina - OU - Both Eyes       Right Eye Quality was good. Central Foveal Thickness: 282. Progression has been stable. Findings include normal foveal contour, intraretinal fluid, pigment epithelial detachment, retinal drusen , no SRF (persistent nasal IRF/cystic changes, stable resolution of SRF).   Left Eye Quality was good. Central Foveal Thickness:  272. Progression has worsened. Findings include  normal foveal contour, intraretinal fluid, no SRF, retinal drusen (Interval increase in peripapillary IRF/cystic changes; stable improvement in SRF).   Notes *Images captured and stored on drive  Diagnosis / Impression:  OD: persistent nasal IRF/cystic changes, stable resolution of SRF OS: Interval increase in peripapillary IRF/cystic changes; stable improvement in SRF  Clinical management:  See below  Abbreviations: NFP - Normal foveal profile. CME - cystoid macular edema. PED - pigment epithelial detachment. IRF - intraretinal fluid. SRF - subretinal fluid. EZ - ellipsoid zone. ERM - epiretinal membrane. ORA - outer retinal atrophy. ORT - outer retinal tubulation. SRHM - subretinal hyper-reflective material. IRHM - intraretinal hyper-reflective material      Intravitreal Injection, Pharmacologic Agent - OS - Left Eye       Time Out 10/27/2021. 8:41 AM. Confirmed correct patient, procedure, site, and patient consented.   Anesthesia Topical anesthesia was used. Anesthetic medications included Lidocaine 2%, Proparacaine 0.5%.   Procedure Preparation included 5% betadine to ocular surface, eyelid speculum. A supplied needle was used.   Injection: 1.25 mg Bevacizumab 1.31m/0.05ml   Route: Intravitreal, Site: Left Eye   NDC:: 91791-505-69 Lot: [id number] , Expiration date: 12/22/2021, Waste: 0 mL   Post-op Post injection exam found visual acuity of at least counting fingers. The patient tolerated the procedure well. There were no complications. The patient received written and verbal post procedure care education. Post injection medications were not given.             ASSESSMENT/PLAN:    ICD-10-CM   1. Exudative age-related macular degeneration of both eyes with active choroidal neovascularization (HCC)  H35.3231 Intravitreal Injection, Pharmacologic Agent - OS - Left Eye    Bevacizumab (AVASTIN) SOLN 1.25 mg    2. Retinal  edema  H35.81 OCT, Retina - OU - Both Eyes    3. Essential hypertension  I10     4. Hypertensive retinopathy of both eyes  H35.033     5. Pseudophakia, both eyes  Z96.1      1,2. Peripapillary CNV / ex ARMD OU  - 8.1.22 pt with 1 wk history of decreased vision OS and isolated light green flashes OD -- flashes OD resolved, blurriness OS improving  - 10.04.22 small recurrent episode of flashes OD last week.   - 11.15.22 subjectively blurred vision OS - BCVA 20/20 stable  - exam and OCT shows OD: persistent nasal IRF/cystic changes, stable resolution of SRF; OS: Interval increase in peripapillary IRF/cystic changes; stable improvement in SRF   - FA (08.23.22) shows mild peripapillary staining and leakage OU -- +CNV  - unclear etiology, but suspect ARMD vs idiopathic CNV; post-COVID vaccine? -- pt received 2nd booster end of Sept 2022  - discussed findings and prognosis  - recommend IVA OS #1 today, 11.15.22 for worsening peripapillary edema / CNV  - pt wishes to proceed with injection  - RBA of procedure discussed, questions answered - IVA informed consent obtained and signed, 11.15.22 (OS) - see procedure note  - Amsler grid given to closely monitor for vision changes  - f/u 4 weeks, DFE, OCT, possible injection  3,4. Hypertensive retinopathy OU - discussed importance of tight BP control - monitor  5. Pseudophakia OU  - s/p CE/IOL (Dr. B11.28.22 2022)  - IOLs in good position, doing well  - monitor  Ophthalmic Meds Ordered this visit:  Meds ordered this encounter  Medications   Bevacizumab (AVASTIN) SOLN 1.25 mg      Return in about 4 weeks (around 11/24/2021) for f/u exu  ARMD OU, DFE, OCT.  There are no Patient Instructions on file for this visit.  Explained the diagnoses, plan, and follow up with the patient and they expressed understanding.  Patient expressed understanding of the importance of proper follow up care.   This document serves as a record of services  personally performed by Gardiner Sleeper, MD, PhD. It was created on their behalf by Estill Bakes, COT an ophthalmic technician. The creation of this record is the provider's dictation and/or activities during the visit.    Electronically signed by: Estill Bakes, COT 11.14.22 @ 9:54 AM   This document serves as a record of services personally performed by Gardiner Sleeper, MD, PhD. It was created on their behalf by San Jetty. Owens Shark, OA an ophthalmic technician. The creation of this record is the provider's dictation and/or activities during the visit.    Electronically signed by: San Jetty. Owens Shark, [place] 11.15.2022 9:54 AM   Gardiner Sleeper, M.D., Ph.D. Diseases & Surgery of the Retina and Vitreous [facility]  I have reviewed the above documentation for accuracy and completeness, and I agree with the above. Gardiner Sleeper, M.D., Ph.D. 10/27/21 11:45 AM  Abbreviations: M myopia (nearsighted); A astigmatism; H hyperopia (farsighted); P presbyopia; Mrx spectacle prescription;  CTL contact lenses; OD right eye; OS left eye; OU both eyes  XT exotropia; ET esotropia; PEK punctate epithelial keratitis; PEE punctate epithelial erosions; DES dry eye syndrome; MGD meibomian gland dysfunction; ATs artificial tears; PFAT's preservative free artificial tears; [place] nuclear sclerotic cataract; PSC posterior subcapsular cataract; ERM epi-retinal membrane; PVD posterior vitreous detachment; RD retinal detachment; DM diabetes mellitus; DR diabetic retinopathy; NPDR non-proliferative diabetic retinopathy; PDR proliferative diabetic retinopathy; CSME clinically significant macular edema; DME diabetic macular edema; dbh dot blot hemorrhages; CWS cotton wool spot; POAG primary open angle glaucoma; C/D cup-to-disc ratio; HVF humphrey visual field; GVF goldmann visual field; OCT optical coherence tomography; IOP intraocular pressure; BRVO Branch retinal vein occlusion; CRVO central retinal vein occlusion;  CRAO central retinal artery occlusion; BRAO branch retinal artery occlusion; RT retinal tear; SB scleral buckle; PPV pars plana vitrectomy; VH Vitreous hemorrhage; PRP panretinal laser photocoagulation; IVK intravitreal kenalog; VMT vitreomacular traction; MH Macular hole;  NVD neovascularization of the disc; NVE neovascularization elsewhere; AREDS age related eye disease study; ARMD age related macular degeneration; POAG primary open angle glaucoma; EBMD epithelial/anterior basement membrane dystrophy; ACIOL anterior chamber intraocular lens; IOL intraocular lens; PCIOL posterior chamber intraocular lens; Phaco/IOL phacoemulsification with intraocular lens placement; [place] photorefractive keratectomy; LASIK laser assisted in situ keratomileusis; HTN hypertension; DM diabetes mellitus; COPD chronic obstructive pulmonary disease

## 2021-10-27 ENCOUNTER — Other Ambulatory Visit: Payer: Self-pay

## 2021-10-27 ENCOUNTER — Encounter (INDEPENDENT_AMBULATORY_CARE_PROVIDER_SITE_OTHER): Payer: Self-pay | Admitting: Ophthalmology

## 2021-10-27 ENCOUNTER — Ambulatory Visit (INDEPENDENT_AMBULATORY_CARE_PROVIDER_SITE_OTHER): Payer: PPO | Admitting: Ophthalmology

## 2021-10-27 DIAGNOSIS — H3581 Retinal edema: Secondary | ICD-10-CM

## 2021-10-27 DIAGNOSIS — I10 Essential (primary) hypertension: Secondary | ICD-10-CM

## 2021-10-27 DIAGNOSIS — H539 Unspecified visual disturbance: Secondary | ICD-10-CM

## 2021-10-27 DIAGNOSIS — H35033 Hypertensive retinopathy, bilateral: Secondary | ICD-10-CM | POA: Diagnosis not present

## 2021-10-27 DIAGNOSIS — Z961 Presence of intraocular lens: Secondary | ICD-10-CM | POA: Diagnosis not present

## 2021-10-27 DIAGNOSIS — H353231 Exudative age-related macular degeneration, bilateral, with active choroidal neovascularization: Secondary | ICD-10-CM | POA: Diagnosis not present

## 2021-10-27 MED ORDER — BEVACIZUMAB CHEMO INJECTION 1.25MG/0.05ML SYRINGE FOR KALEIDOSCOPE
1.2500 mg | INTRAVITREAL | Status: AC | PRN
  Administered 2021-10-27: 1.25 mg via INTRAVITREAL

## 2021-10-29 DIAGNOSIS — D123 Benign neoplasm of transverse colon: Secondary | ICD-10-CM | POA: Diagnosis not present

## 2021-10-29 DIAGNOSIS — K648 Other hemorrhoids: Secondary | ICD-10-CM | POA: Diagnosis not present

## 2021-10-29 DIAGNOSIS — Z86010 Personal history of colonic polyps: Secondary | ICD-10-CM | POA: Diagnosis not present

## 2021-10-29 DIAGNOSIS — D124 Benign neoplasm of descending colon: Secondary | ICD-10-CM | POA: Diagnosis not present

## 2021-11-03 DIAGNOSIS — D124 Benign neoplasm of descending colon: Secondary | ICD-10-CM | POA: Diagnosis not present

## 2021-11-03 DIAGNOSIS — D123 Benign neoplasm of transverse colon: Secondary | ICD-10-CM | POA: Diagnosis not present

## 2021-11-17 DIAGNOSIS — Z23 Encounter for immunization: Secondary | ICD-10-CM | POA: Diagnosis not present

## 2021-11-17 DIAGNOSIS — Z79899 Other long term (current) drug therapy: Secondary | ICD-10-CM | POA: Diagnosis not present

## 2021-11-17 DIAGNOSIS — Z0001 Encounter for general adult medical examination with abnormal findings: Secondary | ICD-10-CM | POA: Diagnosis not present

## 2021-11-17 DIAGNOSIS — E7800 Pure hypercholesterolemia, unspecified: Secondary | ICD-10-CM | POA: Diagnosis not present

## 2021-11-17 DIAGNOSIS — R7303 Prediabetes: Secondary | ICD-10-CM | POA: Diagnosis not present

## 2021-11-17 DIAGNOSIS — I10 Essential (primary) hypertension: Secondary | ICD-10-CM | POA: Diagnosis not present

## 2021-11-17 DIAGNOSIS — I2510 Atherosclerotic heart disease of native coronary artery without angina pectoris: Secondary | ICD-10-CM | POA: Diagnosis not present

## 2021-11-18 NOTE — Progress Notes (Signed)
[facility] Note  11/24/2021     CHIEF COMPLAINT Patient presents for Retina Follow Up  HISTORY OF PRESENT ILLNESS: Raymond Alexander is a 75 y.o. male who presents to the clinic today for:   HPI     Retina Follow Up   Patient presents with  Wet AMD.  In both eyes.  This started 4 weeks ago.  I, the attending physician,  performed the HPI with the patient and updated documentation appropriately.        Comments   Patient here for 4 weeks retina follow up for exu ARMD OU. Patient states vision not too bad. OS seems to improved after last injection.       Last edited by Bernarda Caffey, MD on 11/24/2021  8:45 AM.     Referring physician: Macarthur Critchley, [address] [place],  [place] [address]  HISTORICAL INFORMATION:   Selected notes from the MEDICAL RECORD NUMBER Referred by Dr. Nicki Reaper for concern of retinal edema OU   CURRENT MEDICATIONS: No current outpatient medications on file. (Ophthalmic Drugs)   No current facility-administered medications for this visit. (Ophthalmic Drugs)   Current Outpatient Medications (Other)  Medication Sig   aspirin 81 MG tablet Take 81 mg by mouth daily.   atorvastatin (LIPITOR) 80 MG tablet Take 80 mg by mouth daily.   Coenzyme Q10 (CO Q-10) 100 MG CAPS Take 300 mg by mouth daily.   ezetimibe (ZETIA) 10 MG tablet Take 10 mg by mouth daily.   fexofenadine (ALLEGRA) 180 MG tablet Take 180 mg by mouth daily as needed for allergies.    fluticasone (FLONASE) 50 MCG/ACT nasal spray Place 1 spray into both nostrils daily.   lisinopril (PRINIVIL,ZESTRIL) 10 MG tablet Take 10 mg by mouth daily.   Mag Aspart-Potassium Aspart (POTASSIUM & MAGNESIUM ASPARTAT) 250-250 MG CAPS Take 1 tablet by mouth daily.   Multiple Vitamin (MULTIVITAMIN) tablet Take 1 tablet by mouth daily.   nitroGLYCERIN (NITROSTAT) 0.4 MG SL tablet Place 0.4 mg under the tongue every 5 (five) minutes as needed for chest pain.   Omega-3 Fatty  Acids (FISH OIL) 1000 MG CAPS Take 2 capsules by mouth daily.   OVER THE COUNTER MEDICATION Take 1 tablet by mouth daily. Med Name: PROTANDIM   sildenafil (VIAGRA) 50 MG tablet Take 50 mg by mouth daily as needed for erectile dysfunction.   No current facility-administered medications for this visit. (Other)   REVIEW OF SYSTEMS: ROS   Positive for: Cardiovascular, Eyes Negative for: Constitutional, Gastrointestinal, Neurological, Skin, Genitourinary, Musculoskeletal, HENT, Endocrine, Respiratory, Psychiatric, Allergic/Imm, Heme/Lymph Last edited by Theodore Demark, COA on 11/24/2021  8:08 AM.     ALLERGIES No Known Allergies  PAST MEDICAL HISTORY Past Medical History:  Diagnosis Date   ASCVD (arteriosclerotic cardiovascular disease)    3 vessel   Clotting disorder ([place])    Esophageal reflux    Hyperlipidemia    Hypertension    Past Surgical History:  Procedure Laterality Date   CATARACT EXTRACTION     PTCA  10/14/2003   mid RCA   FAMILY HISTORY Family History  Problem Relation Age of Onset   Heart attack Father    Cancer Mother    Diabetes Sister    Cancer Brother    Healthy Brother    SOCIAL HISTORY Social History   Tobacco Use   Smoking status: Former   Smokeless tobacco: Never  Scientific laboratory technician Use: Never used  Substance Use Topics   Alcohol use: Yes    Alcohol/week: 1.0 standard drink    Types: 1 Glasses of wine per week    Comment: 1-2 per day   Drug use: No       OPHTHALMIC EXAM:  Base Eye Exam     Visual Acuity (Snellen - Linear)       Right Left   Dist cc 20/20 -1 20/20    Correction: Glasses  OS looking to the side.        Tonometry (Tonopen, 8:05 AM)       Right Left   Pressure 13 12         Pupils       Dark Light Shape React APD   Right 3 2 Round Brisk None   Left 3 2 Round Brisk None         Visual Fields (Counting fingers)       Left Right    Full Full         Extraocular Movement       Right Left     Full, Ortho Full, Ortho         Neuro/Psych     Oriented x3: Yes   Mood/Affect: Normal         Dilation     Both eyes: 1.0% Mydriacyl, 2.5% Phenylephrine @ 8:05 AM           Slit Lamp and Fundus Exam     Slit Lamp Exam       Right Left   Lids/Lashes Normal Normal   Conjunctiva/Sclera White and quiet White and quiet   Cornea arcus, well healed cataract wound, trace PEE arcus, well healed cataract wound, trace PEE   Anterior Chamber Deep and quiet Deep and quiet   Iris Round and dilated Round and dilated   Lens PC IOL in good position Toric PC IOL in good position with marks at 0530   Anterior Vitreous Mild Vitreous syneresis, no cell or pigment, low lying Posterior vitreous detachment, Weiss ring Mild Vitreous syneresis, no cell or pigment         Fundus Exam       Right Left   Disc Pink and Sharp, mild peripapillary edema, ?peripapillary CNV ST to disc -- persistent, no heme Pink and Sharp, peripapillary edema slight improved, CNV ST disc, no heme   C/D Ratio 0.5 0.4   Macula Good foveal reflex, nasal cystic changes/edema - persistent, fine drusen, no heme Good foveal reflex, mild edema nasal macula - slightly improved, no heme   Vessels mild attenuation, mild Copper wiring, mild AV crossing changes mild attenuation, mild Copper wiring, mild AV crossing changes   Periphery Attached, No heme , no RT/RD Attached, No heme            Refraction     Wearing Rx       Sphere Cylinder Axis Add   Right [place] +0.25 060 +2.25   Left +0.25 Sphere  +2.25           IMAGING AND PROCEDURES  Imaging and Procedures for 11/24/2021  OCT, Retina - OU - Both Eyes       Right Eye Quality was good. Central Foveal Thickness: 280. Progression has been stable. Findings include normal foveal contour, intraretinal fluid, pigment epithelial detachment, retinal drusen , no SRF (persistent nasal IRF/cystic changes, stable resolution of SRF).   Left Eye Quality was good.  Central Foveal Thickness: 270. Progression has improved.  Findings include normal foveal contour, intraretinal fluid, no SRF, retinal drusen (Interval improvement in peripapillary IRF/cystic changes; stable resolution of SRF).   Notes *Images captured and stored on drive  Diagnosis / Impression:  OD: persistent nasal IRF/cystic changes, stable resolution of SRF OS: Interval improvement in peripapillary IRF/cystic changes; stable resolution of SRF  Clinical management:  See below  Abbreviations: NFP - Normal foveal profile. CME - cystoid macular edema. PED - pigment epithelial detachment. IRF - intraretinal fluid. SRF - subretinal fluid. EZ - ellipsoid zone. ERM - epiretinal membrane. ORA - outer retinal atrophy. ORT - outer retinal tubulation. SRHM - subretinal hyper-reflective material. IRHM - intraretinal hyper-reflective material      Intravitreal Injection, Pharmacologic Agent - OS - Left Eye       Time Out 11/24/2021. 8:26 AM. Confirmed correct patient, procedure, site, and patient consented.   Anesthesia Topical anesthesia was used. Anesthetic medications included Lidocaine 2%, Proparacaine 0.5%.   Procedure Preparation included 5% betadine to ocular surface, eyelid speculum. A supplied needle was used.   Injection: 1.25 mg Bevacizumab 1.25mg /0.46ml   Route: Intravitreal, Site: Left Eye   NDC: H061816, Lot: [id number] , Expiration date: 01/19/2022, Waste: 0 mL   Post-op Post injection exam found visual acuity of at least counting fingers. The patient tolerated the procedure well. There were no complications. The patient received written and verbal post procedure care education. Post injection medications were not given.            ASSESSMENT/PLAN:    ICD-10-CM   1. Exudative age-related macular degeneration of both eyes with active choroidal neovascularization (HCC)  H35.3231 Intravitreal Injection, Pharmacologic Agent - OS - Left Eye    Bevacizumab (AVASTIN) SOLN  1.25 mg    2. Retinal edema  H35.81 OCT, Retina - OU - Both Eyes    3. Essential hypertension  I10     4. Hypertensive retinopathy of both eyes  H35.033     5. Pseudophakia, both eyes  Z96.1       1,2. Peripapillary CNV / ex ARMD OU  - 8.1.22 pt with 1 wk history of decreased vision OS and isolated light green flashes OD -- flashes OD resolved, blurriness OS improving  - 10.04.22 small recurrent episode of flashes OD last week.   - 11.15.22 subjectively blurred vision OS             - s/p IVA OS #1 (11.15.22) - BCVA 20/20 OU - stable  - exam and OCT shows OD: persistent nasal IRF/cystic changes, stable resolution of SRF; OS: Interval improvement in peripapillary IRF/cystic changes; stable improvement in SRF   - FA (08.23.22) shows mild peripapillary staining and leakage OU -- +CNV  - unclear etiology, but suspect ARMD vs idiopathic CNV; post-COVID vaccine? -- pt received 2nd booster end of Sept 2022  - recommend IVA OS #2 today, 12.13.22 peripapillary edema / CNV  - pt wishes to proceed with injection  - RBA of procedure discussed, questions answered - IVA informed consent obtained and signed, 11.15.22 (OS) - see procedure note  - Amsler grid given to closely monitor for vision changes  - f/u 4 weeks, DFE, OCT, possible injection  3,4. Hypertensive retinopathy OU - discussed importance of tight BP control - monitor  5. Pseudophakia OU  - s/p CE/IOL (Dr. 11.28.22, 2022)  - IOLs in good position, doing well  - monitor  Ophthalmic Meds Ordered this visit:  Meds ordered this encounter  Medications   Bevacizumab (AVASTIN) SOLN 1.25 mg  Return in about 4 weeks (around 12/22/2021) for f/u peripapillary CNV OU, DFE, OCT.  There are no Patient Instructions on file for this visit.  Explained the diagnoses, plan, and follow up with the patient and they expressed understanding.  Patient expressed understanding of the importance of proper follow up care.   This document serves as  a record of services personally performed by Gardiner Sleeper, MD, PhD. It was created on their behalf by Estill Bakes, COT an ophthalmic technician. The creation of this record is the provider's dictation and/or activities during the visit.    Electronically signed by: Estill Bakes, COT 12.7.22 @ 11:03 AM   This document serves as a record of services personally performed by Gardiner Sleeper, MD, PhD. It was created on their behalf by San Jetty. Owens Shark, OA an ophthalmic technician. The creation of this record is the provider's dictation and/or activities during the visit.    Electronically signed by: San Jetty. Owens Shark, [place] 12.13.2022 11:03 AM  Gardiner Sleeper, M.D., Ph.D. Diseases & Surgery of the Retina and Vitreous [facility]  I have reviewed the above documentation for accuracy and completeness, and I agree with the above. Gardiner Sleeper, M.D., Ph.D. 11/24/21 11:03 AM   Abbreviations: M myopia (nearsighted); A astigmatism; H hyperopia (farsighted); P presbyopia; Mrx spectacle prescription;  CTL contact lenses; OD right eye; OS left eye; OU both eyes  XT exotropia; ET esotropia; PEK punctate epithelial keratitis; PEE punctate epithelial erosions; DES dry eye syndrome; MGD meibomian gland dysfunction; ATs artificial tears; PFAT's preservative free artificial tears; [place] nuclear sclerotic cataract; PSC posterior subcapsular cataract; ERM epi-retinal membrane; PVD posterior vitreous detachment; RD retinal detachment; DM diabetes mellitus; DR diabetic retinopathy; NPDR non-proliferative diabetic retinopathy; PDR proliferative diabetic retinopathy; CSME clinically significant macular edema; DME diabetic macular edema; dbh dot blot hemorrhages; CWS cotton wool spot; POAG primary open angle glaucoma; C/D cup-to-disc ratio; HVF humphrey visual field; GVF goldmann visual field; OCT optical coherence tomography; IOP intraocular pressure; BRVO Branch retinal vein occlusion; CRVO central  retinal vein occlusion; CRAO central retinal artery occlusion; BRAO branch retinal artery occlusion; RT retinal tear; SB scleral buckle; PPV pars plana vitrectomy; VH Vitreous hemorrhage; PRP panretinal laser photocoagulation; IVK intravitreal kenalog; VMT vitreomacular traction; MH Macular hole;  NVD neovascularization of the disc; NVE neovascularization elsewhere; AREDS age related eye disease study; ARMD age related macular degeneration; POAG primary open angle glaucoma; EBMD epithelial/anterior basement membrane dystrophy; ACIOL anterior chamber intraocular lens; IOL intraocular lens; PCIOL posterior chamber intraocular lens; Phaco/IOL phacoemulsification with intraocular lens placement; [place] photorefractive keratectomy; LASIK laser assisted in situ keratomileusis; HTN hypertension; DM diabetes mellitus; COPD chronic obstructive pulmonary disease

## 2021-11-24 ENCOUNTER — Encounter (INDEPENDENT_AMBULATORY_CARE_PROVIDER_SITE_OTHER): Payer: Self-pay | Admitting: Ophthalmology

## 2021-11-24 ENCOUNTER — Other Ambulatory Visit: Payer: Self-pay

## 2021-11-24 ENCOUNTER — Ambulatory Visit (INDEPENDENT_AMBULATORY_CARE_PROVIDER_SITE_OTHER): Payer: PPO | Admitting: Ophthalmology

## 2021-11-24 DIAGNOSIS — I10 Essential (primary) hypertension: Secondary | ICD-10-CM

## 2021-11-24 DIAGNOSIS — H353231 Exudative age-related macular degeneration, bilateral, with active choroidal neovascularization: Secondary | ICD-10-CM

## 2021-11-24 DIAGNOSIS — Z961 Presence of intraocular lens: Secondary | ICD-10-CM | POA: Diagnosis not present

## 2021-11-24 DIAGNOSIS — H35033 Hypertensive retinopathy, bilateral: Secondary | ICD-10-CM | POA: Diagnosis not present

## 2021-11-24 DIAGNOSIS — H3581 Retinal edema: Secondary | ICD-10-CM

## 2021-11-24 MED ORDER — BEVACIZUMAB CHEMO INJECTION 1.25MG/0.05ML SYRINGE FOR KALEIDOSCOPE
1.2500 mg | INTRAVITREAL | Status: AC | PRN
  Administered 2021-11-24: 1.25 mg via INTRAVITREAL

## 2021-12-16 NOTE — Progress Notes (Signed)
[facility] Note  12/22/2021     CHIEF COMPLAINT Patient presents for Retina Follow Up  HISTORY OF PRESENT ILLNESS: Raymond Alexander is a 76 y.o. male who presents to the clinic today for:   HPI     Retina Follow Up   Patient presents with  Wet AMD.  In both eyes.  Duration of 4 weeks.  Since onset it is stable.  I, the attending physician,  performed the HPI with the patient and updated documentation appropriately.        Comments   4 week follow up Exu ARMD OU, Peripapillary CNV OU-  Patient has not noticed any changes with eyes or vision.       Last edited by Bernarda Caffey, MD on 12/22/2021  8:49 AM.    Pt states no change in vision, he has been sick for the past couple of days, but is feeling better today   Referring physician: Macarthur Critchley, [place] [place],   [id number]  HISTORICAL INFORMATION:   Selected notes from the MEDICAL RECORD NUMBER Referred by Dr. Nicki Reaper for concern of retinal edema OU   CURRENT MEDICATIONS: No current outpatient medications on file. (Ophthalmic Drugs)   No current facility-administered medications for this visit. (Ophthalmic Drugs)   Current Outpatient Medications (Other)  Medication Sig   aspirin 81 MG tablet Take 81 mg by mouth daily.   atorvastatin (LIPITOR) 80 MG tablet Take 80 mg by mouth daily.   Coenzyme Q10 (CO Q-10) 100 MG CAPS Take 300 mg by mouth daily.   ezetimibe (ZETIA) 10 MG tablet Take 10 mg by mouth daily.   fexofenadine (ALLEGRA) 180 MG tablet Take 180 mg by mouth daily as needed for allergies.    fluticasone (FLONASE) 50 MCG/ACT nasal spray Place 1 spray into both nostrils daily.   lisinopril (PRINIVIL,ZESTRIL) 10 MG tablet Take 10 mg by mouth daily.   Mag Aspart-Potassium Aspart (POTASSIUM & MAGNESIUM ASPARTAT) 250-250 MG CAPS Take 1 tablet by mouth daily.   Multiple Vitamin (MULTIVITAMIN) tablet Take 1 tablet by mouth daily.   nitroGLYCERIN (NITROSTAT) 0.4 MG SL  tablet Place 0.4 mg under the tongue every 5 (five) minutes as needed for chest pain.   Omega-3 Fatty Acids (FISH OIL) 1000 MG CAPS Take 2 capsules by mouth daily.   OVER THE COUNTER MEDICATION Take 1 tablet by mouth daily. Med Name: PROTANDIM   sildenafil (VIAGRA) 50 MG tablet Take 50 mg by mouth daily as needed for erectile dysfunction.   No current facility-administered medications for this visit. (Other)   REVIEW OF SYSTEMS: ROS   Positive for: Cardiovascular, Eyes Negative for: Constitutional, Gastrointestinal, Neurological, Skin, Genitourinary, Musculoskeletal, HENT, Endocrine, Respiratory, Psychiatric, Allergic/Imm, Heme/Lymph Last edited by Leonie Douglas, COA on 12/22/2021  7:53 AM.     ALLERGIES No Known Allergies  PAST MEDICAL HISTORY Past Medical History:  Diagnosis Date   ASCVD (arteriosclerotic cardiovascular disease)    3 vessel   Clotting disorder ([place])    Esophageal reflux    Hyperlipidemia    Hypertension    Past Surgical History:  Procedure Laterality Date   CATARACT EXTRACTION     PTCA  10/14/2003   mid RCA   FAMILY HISTORY Family History  Problem Relation Age of Onset   Heart attack Father    Cancer Mother    Diabetes Sister    Cancer Brother    Healthy Brother    SOCIAL HISTORY Social History  Tobacco Use   Smoking status: Former   Smokeless tobacco: Never  Vaping Use   Vaping Use: Never used  Substance Use Topics   Alcohol use: Yes    Alcohol/week: 1.0 standard drink    Types: 1 Glasses of wine per week    Comment: 1-2 per day   Drug use: No       OPHTHALMIC EXAM:  Base Eye Exam     Visual Acuity (Snellen - Linear)       Right Left   Dist cc 20/20 -2 20/20 -2         Tonometry (Tonopen, 7:59 AM)       Right Left   Pressure 14 17         Pupils       Dark Light Shape React APD   Right 3 2 Round Brisk None   Left 3 2 Round Brisk None         Visual Fields (Counting fingers)       Left Right    Full Full          Extraocular Movement       Right Left    Full Full         Neuro/Psych     Oriented x3: Yes   Mood/Affect: Normal         Dilation     Both eyes: 1.0% Mydriacyl, 2.5% Phenylephrine @ 7:59 AM           Slit Lamp and Fundus Exam     Slit Lamp Exam       Right Left   Lids/Lashes Normal Normal   Conjunctiva/Sclera White and quiet White and quiet   Cornea arcus, well healed cataract wound, trace PEE arcus, well healed cataract wound, trace PEE   Anterior Chamber Deep and quiet Deep and quiet   Iris Round and dilated Round and dilated   Lens PC IOL in good position Toric PC IOL in good position with marks at 0530   Anterior Vitreous Mild Vitreous syneresis, no cell or pigment, low lying Posterior vitreous detachment, Weiss ring Mild Vitreous syneresis, no cell or pigment         Fundus Exam       Right Left   Disc Pink and Sharp, mild peripapillary edema, ?peripapillary CNV ST to disc -- persistent, no heme Pink and Sharp, peripapillary edema slight increased, CNV ST disc, no heme   C/D Ratio 0.5 0.4   Macula Good foveal reflex, nasal cystic changes/edema - persistent, slightly increased, fine drusen, no heme Good foveal reflex, mild edema nasal macula - slightly increased, no heme   Vessels mild attenuation, mild Copper wiring, mild AV crossing changes mild attenuation, mild Copper wiring, mild AV crossing changes   Periphery Attached, No heme , no RT/RD Attached, No heme            Refraction     Wearing Rx       Sphere Cylinder Axis Add   Right [place] +0.25 060 +2.25   Left +0.25 Sphere  +2.25           IMAGING AND PROCEDURES  Imaging and Procedures for 12/22/2021  OCT, Retina - OU - Both Eyes       Right Eye Quality was good. Central Foveal Thickness: 282. Progression has worsened. Findings include normal foveal contour, intraretinal fluid, pigment epithelial detachment, retinal drusen , no SRF (Persistent IRF/cystic changes --  slightly increased, stable resolution of SRF).  Left Eye Quality was good. Central Foveal Thickness: 269. Progression has worsened. Findings include normal foveal contour, intraretinal fluid, no SRF, retinal drusen (Persistent peripapillary IRF/cystic changes -- slightly increased; stable resolution of SRF).   Notes *Images captured and stored on drive  Diagnosis / Impression:  OD: Persistent IRF/cystic changes -- slightly increased, stable resolution of SRF OS: Persistent peripapillary IRF/cystic changes -- slightly increased; stable resolution of SRF  Clinical management:  See below  Abbreviations: NFP - Normal foveal profile. CME - cystoid macular edema. PED - pigment epithelial detachment. IRF - intraretinal fluid. SRF - subretinal fluid. EZ - ellipsoid zone. ERM - epiretinal membrane. ORA - outer retinal atrophy. ORT - outer retinal tubulation. SRHM - subretinal hyper-reflective material. IRHM - intraretinal hyper-reflective material      Intravitreal Injection, Pharmacologic Agent - OS - Left Eye       Time Out 12/22/2021. 8:20 AM. Confirmed correct patient, procedure, site, and patient consented.   Anesthesia Topical anesthesia was used. Anesthetic medications included Lidocaine 2%, Proparacaine 0.5%.   Procedure Preparation included 5% betadine to ocular surface, eyelid speculum. A (32g) needle was used.   Injection: 1.25 mg Bevacizumab 1.25mg /0.8ml   Route: Intravitreal, Site: Left Eye   NDC: H061816, Lot: [id number], Expiration date: 01/21/2022, Waste: 0.05 mL   Post-op Post injection exam found visual acuity of at least counting fingers. The patient tolerated the procedure well. There were no complications. The patient received written and verbal post procedure care education. Post injection medications were not given.             ASSESSMENT/PLAN:    ICD-10-CM   1. Exudative age-related macular degeneration of both eyes with active choroidal  neovascularization (HCC)  H35.3231 OCT, Retina - OU - Both Eyes    Intravitreal Injection, Pharmacologic Agent - OS - Left Eye    Bevacizumab (AVASTIN) SOLN 1.25 mg    2. Essential hypertension  I10     3. Hypertensive retinopathy of both eyes  H35.033     4. Pseudophakia, both eyes  Z96.1       1,2. Peripapillary CNV / ex ARMD OU  - 8.1.22 pt with 1 wk history of decreased vision OS and isolated light green flashes OD -- flashes OD resolved, blurriness OS improving  - 10.04.22 small recurrent episode of flashes OD last week.   - 11.15.22 subjectively blurred vision OS             - s/p IVA OS #1 (11.15.22), #2 (12.13.22) - BCVA 20/20 OU - stable  - exam and OCT shows OD: Persistent IRF/cystic changes -- slightly increased, stable resolution of SRF; OS: Persistent peripapillary IRF/cystic changes -- slightly increased; stable resolution of SRF  - FA (08.23.22) shows mild peripapillary staining and leakage OU -- +CNV  - unclear etiology, but suspect ARMD vs idiopathic CNV; post-COVID vaccine? -- pt received 2nd booster end of Sept 2022  - recommend IVA OS #3 today, 01.10.23 for peripapillary edema / CNV  - pt wishes to proceed with injection  - RBA of procedure discussed, questions answered - IVA informed consent obtained and signed, 11.15.22 (OS) - see procedure note  - Amsler grid given to closely monitor for vision changes  - f/u 4 weeks, DFE, OCT, possible injection  3,4. Hypertensive retinopathy OU - discussed importance of tight BP control - monitor  5. Pseudophakia OU  - s/p CE/IOL (Dr. Talbert Forest, 2022)  - IOLs in good position, doing well  - monitor  Ophthalmic Meds Ordered  this visit:  Meds ordered this encounter  Medications   Bevacizumab (AVASTIN) SOLN 1.25 mg     Return in about 4 weeks (around 01/19/2022) for f/u exu ARMD OU, DFE, OCT.  There are no Patient Instructions on file for this visit.  Explained the diagnoses, plan, and follow up with the patient and  they expressed understanding.  Patient expressed understanding of the importance of proper follow up care.   This document serves as a record of services personally performed by Gardiner Sleeper, MD, PhD. It was created on their behalf by Estill Bakes, COT an ophthalmic technician. The creation of this record is the provider's dictation and/or activities during the visit.    Electronically signed by: Estill Bakes, COT 1.4.23 @ 8:51 AM   This document serves as a record of services personally performed by Gardiner Sleeper, MD, PhD. It was created on their behalf by San Jetty. Owens Shark, OA an ophthalmic technician. The creation of this record is the provider's dictation and/or activities during the visit.    Electronically signed by: San Jetty. Owens Shark, [place] 01.10.2023 8:51 AM  Gardiner Sleeper, M.D., Ph.D. Diseases & Surgery of the Retina and Vitreous [facility]  I have reviewed the above documentation for accuracy and completeness, and I agree with the above. Gardiner Sleeper, M.D., Ph.D. 12/22/21 8:52 AM   Abbreviations: M myopia (nearsighted); A astigmatism; H hyperopia (farsighted); P presbyopia; Mrx spectacle prescription;  CTL contact lenses; OD right eye; OS left eye; OU both eyes  XT exotropia; ET esotropia; PEK punctate epithelial keratitis; PEE punctate epithelial erosions; DES dry eye syndrome; MGD meibomian gland dysfunction; ATs artificial tears; PFAT's preservative free artificial tears; [place] nuclear sclerotic cataract; PSC posterior subcapsular cataract; ERM epi-retinal membrane; PVD posterior vitreous detachment; RD retinal detachment; DM diabetes mellitus; DR diabetic retinopathy; NPDR non-proliferative diabetic retinopathy; PDR proliferative diabetic retinopathy; CSME clinically significant macular edema; DME diabetic macular edema; dbh dot blot hemorrhages; CWS cotton wool spot; POAG primary open angle glaucoma; C/D cup-to-disc ratio; HVF humphrey visual field; GVF  goldmann visual field; OCT optical coherence tomography; IOP intraocular pressure; BRVO Branch retinal vein occlusion; CRVO central retinal vein occlusion; CRAO central retinal artery occlusion; BRAO branch retinal artery occlusion; RT retinal tear; SB scleral buckle; PPV pars plana vitrectomy; VH Vitreous hemorrhage; PRP panretinal laser photocoagulation; IVK intravitreal kenalog; VMT vitreomacular traction; MH Macular hole;  NVD neovascularization of the disc; NVE neovascularization elsewhere; AREDS age related eye disease study; ARMD age related macular degeneration; POAG primary open angle glaucoma; EBMD epithelial/anterior basement membrane dystrophy; ACIOL anterior chamber intraocular lens; IOL intraocular lens; PCIOL posterior chamber intraocular lens; Phaco/IOL phacoemulsification with intraocular lens placement; [place] photorefractive keratectomy; LASIK laser assisted in situ keratomileusis; HTN hypertension; DM diabetes mellitus; COPD chronic obstructive pulmonary disease

## 2021-12-22 ENCOUNTER — Other Ambulatory Visit: Payer: Self-pay

## 2021-12-22 ENCOUNTER — Ambulatory Visit (INDEPENDENT_AMBULATORY_CARE_PROVIDER_SITE_OTHER): Payer: PPO | Admitting: Ophthalmology

## 2021-12-22 ENCOUNTER — Encounter (INDEPENDENT_AMBULATORY_CARE_PROVIDER_SITE_OTHER): Payer: Self-pay | Admitting: Ophthalmology

## 2021-12-22 DIAGNOSIS — I10 Essential (primary) hypertension: Secondary | ICD-10-CM

## 2021-12-22 DIAGNOSIS — Z961 Presence of intraocular lens: Secondary | ICD-10-CM

## 2021-12-22 DIAGNOSIS — H353231 Exudative age-related macular degeneration, bilateral, with active choroidal neovascularization: Secondary | ICD-10-CM | POA: Diagnosis not present

## 2021-12-22 DIAGNOSIS — H35033 Hypertensive retinopathy, bilateral: Secondary | ICD-10-CM

## 2021-12-22 MED ORDER — BEVACIZUMAB CHEMO INJECTION 1.25MG/0.05ML SYRINGE FOR KALEIDOSCOPE
1.2500 mg | INTRAVITREAL | Status: AC | PRN
  Administered 2021-12-22: 1.25 mg via INTRAVITREAL

## 2022-01-19 ENCOUNTER — Encounter (INDEPENDENT_AMBULATORY_CARE_PROVIDER_SITE_OTHER): Payer: PPO | Admitting: Ophthalmology

## 2022-01-19 NOTE — Progress Notes (Addendum)
[facility] Note  01/21/2022    CHIEF COMPLAINT Patient presents for Retina Follow Up  HISTORY OF PRESENT ILLNESS: Raymond Alexander is a 76 y.o. male who presents to the clinic today for:   HPI     Retina Follow Up   Patient presents with  Wet AMD.  In both eyes.  Severity is moderate.  Duration of 4 weeks.  Since onset it is stable.  I, the attending physician,  performed the HPI with the patient and updated documentation appropriately.        Comments   Patient states vision the same OU.       Last edited by Bernarda Caffey, MD on 01/21/2022  8:38 AM.    Pt states he can't tell much difference in vision, but he felt like there was less distortion when reading the eye chart this morning   Referring physician: Lujean Amel, MD [address],  [place] [address]  HISTORICAL INFORMATION:   Selected notes from the [facility] Referred by Dr. Nicki Reaper for concern of retinal edema OU   CURRENT MEDICATIONS: No current outpatient medications on file. (Ophthalmic Drugs)   No current facility-administered medications for this visit. (Ophthalmic Drugs)   Current Outpatient Medications (Other)  Medication Sig   aspirin 81 MG tablet Take 81 mg by mouth daily.   atorvastatin (LIPITOR) 80 MG tablet Take 80 mg by mouth daily.   Coenzyme Q10 (CO Q-10) 100 MG CAPS Take 300 mg by mouth daily.   ezetimibe (ZETIA) 10 MG tablet Take 10 mg by mouth daily.   fexofenadine (ALLEGRA) 180 MG tablet Take 180 mg by mouth daily as needed for allergies.    fluticasone (FLONASE) 50 MCG/ACT nasal spray Place 1 spray into both nostrils daily.   lisinopril (PRINIVIL,ZESTRIL) 10 MG tablet Take 10 mg by mouth daily.   Mag Aspart-Potassium Aspart (POTASSIUM & MAGNESIUM ASPARTAT) 250-250 MG CAPS Take 1 tablet by mouth daily.   Multiple Vitamin (MULTIVITAMIN) tablet Take 1 tablet by mouth daily.   nitroGLYCERIN (NITROSTAT) 0.4 MG SL tablet Place 0.4  mg under the tongue every 5 (five) minutes as needed for chest pain.   Omega-3 Fatty Acids (FISH OIL) 1000 MG CAPS Take 2 capsules by mouth daily.   OVER THE COUNTER MEDICATION Take 1 tablet by mouth daily. Med Name: PROTANDIM   sildenafil (VIAGRA) 50 MG tablet Take 50 mg by mouth daily as needed for erectile dysfunction.   No current facility-administered medications for this visit. (Other)   REVIEW OF SYSTEMS: ROS   Positive for: Cardiovascular, Eyes Negative for: Constitutional, Gastrointestinal, Neurological, Skin, Genitourinary, Musculoskeletal, HENT, Endocrine, Respiratory, Psychiatric, Allergic/Imm, Heme/Lymph Last edited by Roselee Nova D, COT on 01/21/2022  8:06 AM.      ALLERGIES No Known Allergies  PAST MEDICAL HISTORY Past Medical History:  Diagnosis Date   ASCVD (arteriosclerotic cardiovascular disease)    3 vessel   Clotting disorder ([place])    Esophageal reflux    Hyperlipidemia    Hypertension    Past Surgical History:  Procedure Laterality Date   CATARACT EXTRACTION     PTCA  10/14/2003   mid RCA   FAMILY HISTORY Family History  Problem Relation Age of Onset   Heart attack Father    Cancer Mother    Diabetes Sister    Cancer Brother    Healthy Brother    SOCIAL HISTORY Social History   Tobacco Use   Smoking  status: Former   Smokeless tobacco: Never  Scientific laboratory technician Use: Never used  Substance Use Topics   Alcohol use: Yes    Alcohol/week: 1.0 standard drink    Types: 1 Glasses of wine per week    Comment: 1-2 per day   Drug use: No       OPHTHALMIC EXAM:  Base Eye Exam     Visual Acuity (Snellen - Linear)       Right Left   Dist cc 20/20 20/20 -1    Correction: Glasses         Tonometry (Tonopen, 8:11 AM)       Right Left   Pressure 15 17         Pupils       Dark Light Shape React APD   Right 3 2 Round Brisk None   Left 3 2 Round Brisk None         Visual Fields (Counting fingers)       Left Right    Full  Full         Extraocular Movement       Right Left    Full, Ortho Full, Ortho         Neuro/Psych     Oriented x3: Yes   Mood/Affect: Normal         Dilation     Both eyes: 1.0% Mydriacyl, 2.5% Phenylephrine @ 8:11 AM           Slit Lamp and Fundus Exam     Slit Lamp Exam       Right Left   Lids/Lashes Normal Normal   Conjunctiva/Sclera White and quiet White and quiet   Cornea arcus, well healed cataract wound, trace PEE arcus, well healed cataract wound, trace PEE   Anterior Chamber Deep and quiet Deep and quiet   Iris Round and dilated Round and dilated   Lens PC IOL in good position Toric PC IOL in good position with marks at 0530   Anterior Vitreous Mild Vitreous syneresis, no cell or pigment, low lying Posterior vitreous detachment, Weiss ring Mild Vitreous syneresis, no cell or pigment         Fundus Exam       Right Left   Disc Pink and Sharp, mild peripapillary edema - improved, ?peripapillary CNV ST to disc -- persistent, no heme Pink and Sharp, peripapillary edema slightly improved, CNV ST disc, no heme   C/D Ratio 0.5 0.4   Macula Good foveal reflex, nasal cystic changes/edema -slightly improved, fine drusen, no heme Good foveal reflex, mild edema nasal macula - slightly improved, no heme   Vessels mild attenuation, mild Copper wiring, mild AV crossing changes mild attenuation, mild Copper wiring, mild AV crossing changes   Periphery Attached, No heme, no RT/RD Attached, No heme            Refraction     Wearing Rx       Sphere Cylinder Axis Add   Right [place] +0.25 060 +2.25   Left +0.25 Sphere  +2.25           IMAGING AND PROCEDURES  Imaging and Procedures for 01/21/2022  OCT, Retina - OU - Both Eyes       Right Eye Quality was good. Central Foveal Thickness: 281. Progression has improved. Findings include normal foveal contour, intraretinal fluid, pigment epithelial detachment, retinal drusen , no SRF (Interval improvement in  peripapillary IRF/cystic changesstable resolution of SRF).  Left Eye Quality was good. Central Foveal Thickness: 269. Progression has improved. Findings include normal foveal contour, intraretinal fluid, no SRF, retinal drusen (Interval improvement in peripapillary IRF/cystic changes; stable resolution of SRF).   Notes *Images captured and stored on drive  Diagnosis / Impression:  OD: Interval improvement in peripapillary IRF/cystic changes; stable resolution of SRF OS: Interval improvement in peripapillary IRF/cystic changes; stable resolution of SRF  Clinical management:  See below  Abbreviations: NFP - Normal foveal profile. CME - cystoid macular edema. PED - pigment epithelial detachment. IRF - intraretinal fluid. SRF - subretinal fluid. EZ - ellipsoid zone. ERM - epiretinal membrane. ORA - outer retinal atrophy. ORT - outer retinal tubulation. SRHM - subretinal hyper-reflective material. IRHM - intraretinal hyper-reflective material      Intravitreal Injection, Pharmacologic Agent - OS - Left Eye       Time Out 01/21/2022. 8:44 AM. Confirmed correct patient, procedure, site, and patient consented.   Anesthesia Topical anesthesia was used. Anesthetic medications included Lidocaine 2%, Proparacaine 0.5%.   Procedure Preparation included 5% betadine to ocular surface, eyelid speculum. A (32g) needle was used.   Injection: 1.25 mg Bevacizumab 1.25mg /0.43ml   Route: Intravitreal, Site: Left Eye   NDC: H061816, Lot: [id number], Expiration date: 02/22/2022   Post-op Post injection exam found visual acuity of at least counting fingers. The patient tolerated the procedure well. There were no complications. The patient received written and verbal post procedure care education. Post injection medications were not given.            ASSESSMENT/PLAN:    ICD-10-CM   1. Exudative age-related macular degeneration of both eyes with active choroidal neovascularization (HCC)   H35.3231 OCT, Retina - OU - Both Eyes    Intravitreal Injection, Pharmacologic Agent - OS - Left Eye    Bevacizumab (AVASTIN) SOLN 1.25 mg    2. Essential hypertension  I10     3. Hypertensive retinopathy of both eyes  H35.033     4. Pseudophakia, both eyes  Z96.1      1. Peripapillary CNV / ex ARMD OU  - 8.1.22 pt with 1 wk history of decreased vision OS and isolated light green flashes OD -- flashes OD resolved, blurriness OS improving  - 10.04.22 small recurrent episode of flashes OD last week.   - 11.15.22 subjectively blurred vision OS             - s/p IVA OS #1 (11.15.22), #2 (12.13.22), #3 (01.10.23) - BCVA 20/20 OU - stable  - exam and OCT shows interval improvement in peripapillary IRF/cystic changes OU, stable resolution of SRF OU  - FA (08.23.22) shows mild peripapillary staining and leakage OU -- +CNV  - unclear etiology, but suspect ARMD vs idiopathic CNV; post-COVID vaccine? -- pt received 2nd booster end of Sept 2022  - recommend IVA OS #4 today, 02.09.23 for peripapillary edema / CNV  - pt wishes to proceed with injection  - RBA of procedure discussed, questions answered - IVA informed consent obtained and signed, 11.15.22 (OS) - see procedure note  - Amsler grid given to closely monitor for vision changes  - f/u 5-6 weeks, DFE, OCT, possible injection  2,3. Hypertensive retinopathy OU - discussed importance of tight BP control - monitor  4. Pseudophakia OU  - s/p CE/IOL (Dr. Talbert Forest, 2022)  - IOLs in good position, doing well  - monitor  Ophthalmic Meds Ordered this visit:  Meds ordered this encounter  Medications   Bevacizumab (AVASTIN) SOLN 1.25 mg  Return for f/u 5-6 weeks, exu ARMD OS, DFE, OCT.  There are no Patient Instructions on file for this visit.  Explained the diagnoses, plan, and follow up with the patient and they expressed understanding.  Patient expressed understanding of the importance of proper follow up care.   This document  serves as a record of services personally performed by Gardiner Sleeper, MD, PhD. It was created on their behalf by San Jetty. Owens Shark, OA an ophthalmic technician. The creation of this record is the provider's dictation and/or activities during the visit.    Electronically signed by: San Jetty. Owens Shark, [place] 02.07.2023 11:08 AM   Gardiner Sleeper, M.D., Ph.D. Diseases & Surgery of the Retina and Vitreous [facility]  I have reviewed the above documentation for accuracy and completeness, and I agree with the above. Gardiner Sleeper, M.D., Ph.D. 01/21/22 11:09 AM    Abbreviations: M myopia (nearsighted); A astigmatism; H hyperopia (farsighted); P presbyopia; Mrx spectacle prescription;  CTL contact lenses; OD right eye; OS left eye; OU both eyes  XT exotropia; ET esotropia; PEK punctate epithelial keratitis; PEE punctate epithelial erosions; DES dry eye syndrome; MGD meibomian gland dysfunction; ATs artificial tears; PFAT's preservative free artificial tears; [place] nuclear sclerotic cataract; PSC posterior subcapsular cataract; ERM epi-retinal membrane; PVD posterior vitreous detachment; RD retinal detachment; DM diabetes mellitus; DR diabetic retinopathy; NPDR non-proliferative diabetic retinopathy; PDR proliferative diabetic retinopathy; CSME clinically significant macular edema; DME diabetic macular edema; dbh dot blot hemorrhages; CWS cotton wool spot; POAG primary open angle glaucoma; C/D cup-to-disc ratio; HVF humphrey visual field; GVF goldmann visual field; OCT optical coherence tomography; IOP intraocular pressure; BRVO Branch retinal vein occlusion; CRVO central retinal vein occlusion; CRAO central retinal artery occlusion; BRAO branch retinal artery occlusion; RT retinal tear; SB scleral buckle; PPV pars plana vitrectomy; VH Vitreous hemorrhage; PRP panretinal laser photocoagulation; IVK intravitreal kenalog; VMT vitreomacular traction; MH Macular hole;  NVD neovascularization of  the disc; NVE neovascularization elsewhere; AREDS age related eye disease study; ARMD age related macular degeneration; POAG primary open angle glaucoma; EBMD epithelial/anterior basement membrane dystrophy; ACIOL anterior chamber intraocular lens; IOL intraocular lens; PCIOL posterior chamber intraocular lens; Phaco/IOL phacoemulsification with intraocular lens placement; [place] photorefractive keratectomy; LASIK laser assisted in situ keratomileusis; HTN hypertension; DM diabetes mellitus; COPD chronic obstructive pulmonary disease

## 2022-01-21 ENCOUNTER — Ambulatory Visit (INDEPENDENT_AMBULATORY_CARE_PROVIDER_SITE_OTHER): Payer: PPO | Admitting: Ophthalmology

## 2022-01-21 ENCOUNTER — Other Ambulatory Visit: Payer: Self-pay

## 2022-01-21 ENCOUNTER — Encounter (INDEPENDENT_AMBULATORY_CARE_PROVIDER_SITE_OTHER): Payer: Self-pay | Admitting: Ophthalmology

## 2022-01-21 DIAGNOSIS — Z961 Presence of intraocular lens: Secondary | ICD-10-CM

## 2022-01-21 DIAGNOSIS — H353231 Exudative age-related macular degeneration, bilateral, with active choroidal neovascularization: Secondary | ICD-10-CM

## 2022-01-21 DIAGNOSIS — H35033 Hypertensive retinopathy, bilateral: Secondary | ICD-10-CM | POA: Diagnosis not present

## 2022-01-21 DIAGNOSIS — I10 Essential (primary) hypertension: Secondary | ICD-10-CM

## 2022-01-21 MED ORDER — BEVACIZUMAB CHEMO INJECTION 1.25MG/0.05ML SYRINGE FOR KALEIDOSCOPE
1.2500 mg | INTRAVITREAL | Status: AC | PRN
  Administered 2022-01-21: 1.25 mg via INTRAVITREAL

## 2022-02-16 NOTE — Progress Notes (Shared)
[facility] Note  02/25/2022    CHIEF COMPLAINT Patient presents for No chief complaint on file.  HISTORY OF PRESENT ILLNESS: Raymond Alexander is a 76 y.o. male who presents to the clinic today for:    Pt states he can't tell much difference in vision, but he felt like there was less distortion when reading the eye chart this morning   Referring physician: Lujean Amel, MD [address],  [place] [address]  HISTORICAL INFORMATION:   Selected notes from the [facility] Referred by Dr. Nicki Reaper for concern of retinal edema OU   CURRENT MEDICATIONS: No current outpatient medications on file. (Ophthalmic Drugs)   No current facility-administered medications for this visit. (Ophthalmic Drugs)   Current Outpatient Medications (Other)  Medication Sig   aspirin 81 MG tablet Take 81 mg by mouth daily.   atorvastatin (LIPITOR) 80 MG tablet Take 80 mg by mouth daily.   Coenzyme Q10 (CO Q-10) 100 MG CAPS Take 300 mg by mouth daily.   ezetimibe (ZETIA) 10 MG tablet Take 10 mg by mouth daily.   fexofenadine (ALLEGRA) 180 MG tablet Take 180 mg by mouth daily as needed for allergies.    fluticasone (FLONASE) 50 MCG/ACT nasal spray Place 1 spray into both nostrils daily.   lisinopril (PRINIVIL,ZESTRIL) 10 MG tablet Take 10 mg by mouth daily.   Mag Aspart-Potassium Aspart (POTASSIUM & MAGNESIUM ASPARTAT) 250-250 MG CAPS Take 1 tablet by mouth daily.   Multiple Vitamin (MULTIVITAMIN) tablet Take 1 tablet by mouth daily.   nitroGLYCERIN (NITROSTAT) 0.4 MG SL tablet Place 0.4 mg under the tongue every 5 (five) minutes as needed for chest pain.   Omega-3 Fatty Acids (FISH OIL) 1000 MG CAPS Take 2 capsules by mouth daily.   OVER THE COUNTER MEDICATION Take 1 tablet by mouth daily. Med Name: PROTANDIM   sildenafil (VIAGRA) 50 MG tablet Take 50 mg by mouth daily as needed for erectile dysfunction.   No current facility-administered  medications for this visit. (Other)   REVIEW OF SYSTEMS:    ALLERGIES No Known Allergies  PAST MEDICAL HISTORY Past Medical History:  Diagnosis Date   ASCVD (arteriosclerotic cardiovascular disease)    3 vessel   Clotting disorder (HCC)    Esophageal reflux    Hyperlipidemia    Hypertension    Past Surgical History:  Procedure Laterality Date   CATARACT EXTRACTION     PTCA  10/14/2003   mid RCA   FAMILY HISTORY Family History  Problem Relation Age of Onset   Heart attack Father    Cancer Mother    Diabetes Sister    Cancer Brother    Healthy Brother    SOCIAL HISTORY Social History   Tobacco Use   Smoking status: Former   Smokeless tobacco: Never  Scientific laboratory technician Use: Never used  Substance Use Topics   Alcohol use: Yes    Alcohol/week: 1.0 standard drink    Types: 1 Glasses of wine per week    Comment: 1-2 per day   Drug use: No       OPHTHALMIC EXAM:  Not recorded    IMAGING AND PROCEDURES  Imaging and Procedures for 02/25/2022          ASSESSMENT/PLAN:    ICD-10-CM   1. Exudative age-related macular degeneration of both eyes with active choroidal neovascularization ([place])  H35.3231     2. Essential hypertension  I10  3. Hypertensive retinopathy of both eyes  H35.033     4. Pseudophakia, both eyes  Z96.1     5. Visual disturbances  H53.9      1. Peripapillary CNV / ex ARMD OU  - 8.1.22 pt with 1 wk history of decreased vision OS and isolated light green flashes OD -- flashes OD resolved, blurriness OS improving  - 10.04.22 small recurrent episode of flashes OD last week.   - 11.15.22 subjectively blurred vision OS             - s/p IVA OS #1 (11.15.22), #2 (12.13.22), #3 (01.10.23), #4 (02.09.23) - BCVA 20/20 OU - stable  - exam and OCT shows interval improvement in peripapillary IRF/cystic changes OU, stable resolution of SRF OU  - FA (08.23.22) shows mild peripapillary staining and leakage OU -- +CNV  - unclear etiology,  but suspect ARMD vs idiopathic CNV; post-COVID vaccine? -- pt received 2nd booster end of Sept 2022  - recommend IVA OS #5 today, 03.14.23 for peripapillary edema / CNV  - pt wishes to proceed with injection  - RBA of procedure discussed, questions answered - IVA informed consent obtained and signed, 11.15.22 (OS) - see procedure note  - Amsler grid given to closely monitor for vision changes  - f/u 5-6 weeks, DFE, OCT, possible injection  2,3. Hypertensive retinopathy OU - discussed importance of tight BP control - monitor  4. Pseudophakia OU  - s/p CE/IOL (Dr. Talbert Forest, 2022)  - IOLs in good position, doing well  - monitor  Ophthalmic Meds Ordered this visit:  No orders of the defined types were placed in this encounter.    No follow-ups on file.  There are no Patient Instructions on file for this visit.  Explained the diagnoses, plan, and follow up with the patient and they expressed understanding.  Patient expressed understanding of the importance of proper follow up care.   This document serves as a record of services personally performed by Gardiner Sleeper, MD, PhD. It was created on their behalf by San Jetty. Owens Shark, OA an ophthalmic technician. The creation of this record is the provider's dictation and/or activities during the visit.    Electronically signed by: San Jetty. Owens Shark, [place] 03.07.2023 1:02 PM    Gardiner Sleeper, M.D., Ph.D. Diseases & Surgery of the Retina and Vitreous [facility]: M myopia (nearsighted); A astigmatism; H hyperopia (farsighted); P presbyopia; Mrx spectacle prescription;  CTL contact lenses; OD right eye; OS left eye; OU both eyes  XT exotropia; ET esotropia; PEK punctate epithelial keratitis; PEE punctate epithelial erosions; DES dry eye syndrome; MGD meibomian gland dysfunction; ATs artificial tears; PFAT's preservative free artificial tears; [place] nuclear sclerotic cataract; PSC posterior subcapsular  cataract; ERM epi-retinal membrane; PVD posterior vitreous detachment; RD retinal detachment; DM diabetes mellitus; DR diabetic retinopathy; NPDR non-proliferative diabetic retinopathy; PDR proliferative diabetic retinopathy; CSME clinically significant macular edema; DME diabetic macular edema; dbh dot blot hemorrhages; CWS cotton wool spot; POAG primary open angle glaucoma; C/D cup-to-disc ratio; HVF humphrey visual field; GVF goldmann visual field; OCT optical coherence tomography; IOP intraocular pressure; BRVO Branch retinal vein occlusion; CRVO central retinal vein occlusion; CRAO central retinal artery occlusion; BRAO branch retinal artery occlusion; RT retinal tear; SB scleral buckle; PPV pars plana vitrectomy; VH Vitreous hemorrhage; PRP panretinal laser photocoagulation; IVK intravitreal kenalog; VMT vitreomacular traction; MH Macular hole;  NVD neovascularization of the disc; NVE neovascularization elsewhere; AREDS age related  eye disease study; ARMD age related macular degeneration; POAG primary open angle glaucoma; EBMD epithelial/anterior basement membrane dystrophy; ACIOL anterior chamber intraocular lens; IOL intraocular lens; PCIOL posterior chamber intraocular lens; Phaco/IOL phacoemulsification with intraocular lens placement; [place] photorefractive keratectomy; LASIK laser assisted in situ keratomileusis; HTN hypertension; DM diabetes mellitus; COPD chronic obstructive pulmonary disease

## 2022-02-25 ENCOUNTER — Encounter (INDEPENDENT_AMBULATORY_CARE_PROVIDER_SITE_OTHER): Payer: Self-pay | Admitting: Ophthalmology

## 2022-02-25 ENCOUNTER — Other Ambulatory Visit: Payer: Self-pay

## 2022-02-25 ENCOUNTER — Ambulatory Visit (INDEPENDENT_AMBULATORY_CARE_PROVIDER_SITE_OTHER): Payer: PPO | Admitting: Ophthalmology

## 2022-02-25 DIAGNOSIS — Z961 Presence of intraocular lens: Secondary | ICD-10-CM | POA: Diagnosis not present

## 2022-02-25 DIAGNOSIS — I10 Essential (primary) hypertension: Secondary | ICD-10-CM

## 2022-02-25 DIAGNOSIS — H35033 Hypertensive retinopathy, bilateral: Secondary | ICD-10-CM | POA: Diagnosis not present

## 2022-02-25 DIAGNOSIS — H353231 Exudative age-related macular degeneration, bilateral, with active choroidal neovascularization: Secondary | ICD-10-CM

## 2022-02-25 DIAGNOSIS — H539 Unspecified visual disturbance: Secondary | ICD-10-CM | POA: Diagnosis not present

## 2022-02-25 MED ORDER — BEVACIZUMAB CHEMO INJECTION 1.25MG/0.05ML SYRINGE FOR KALEIDOSCOPE
1.2500 mg | INTRAVITREAL | Status: AC | PRN
  Administered 2022-02-25: 1.25 mg via INTRAVITREAL

## 2022-03-23 DIAGNOSIS — H00025 Hordeolum internum left lower eyelid: Secondary | ICD-10-CM | POA: Diagnosis not present

## 2022-03-26 NOTE — Progress Notes (Signed)
?[facility] Note ? ?03/30/2022 ?  ? ?CHIEF COMPLAINT ?Patient presents for Retina Follow Up ? ?HISTORY OF PRESENT ILLNESS: ?Raymond Alexander is a 76 y.o. male who presents to the clinic today for:  ? ?HPI   ? ? Retina Follow Up   ?Patient presents with  Wet AMD.  In both eyes.  Severity is moderate.  Duration of 5 weeks.  Since onset it is stable.  I, the attending physician,  performed the HPI with the patient and updated documentation appropriately. ? ?  ?  ? ? Comments   ?Pt here for 5 wk ret f/u for exu ARMD OU. Pt states VA is about the same, reports some FOL still in OS when he turns his head, no reports of floaters.  ? ?  ?  ?Last edited by Bernarda Caffey, MD on 03/30/2022  9:23 AM.  ?  ?Pt states no change in vision, he is being treated for a stye by Dr. Nicki Reaper ? ? ?Referring physician: ?Lujean Amel, MD ?[address] ?[address] ?[place],  [place] [address] ? ?HISTORICAL INFORMATION:  ? ?Selected notes from the [facility] ?Referred by Dr. Nicki Reaper for concern of retinal edema OU  ? ?CURRENT MEDICATIONS: ?No current outpatient medications on file. (Ophthalmic Drugs)  ? ?No current facility-administered medications for this visit. (Ophthalmic Drugs)  ? ?Current Outpatient Medications (Other)  ?Medication Sig  ? aspirin 81 MG tablet Take 81 mg by mouth daily.  ? atorvastatin (LIPITOR) 80 MG tablet Take 80 mg by mouth daily.  ? Coenzyme Q10 (CO Q-10) 100 MG CAPS Take 300 mg by mouth daily.  ? ezetimibe (ZETIA) 10 MG tablet Take 10 mg by mouth daily.  ? fexofenadine (ALLEGRA) 180 MG tablet Take 180 mg by mouth daily as needed for allergies.   ? fluticasone (FLONASE) 50 MCG/ACT nasal spray Place 1 spray into both nostrils daily.  ? lisinopril (PRINIVIL,ZESTRIL) 10 MG tablet Take 10 mg by mouth daily.  ? Mag Aspart-Potassium Aspart (POTASSIUM & MAGNESIUM ASPARTAT) 250-250 MG CAPS Take 1 tablet by mouth daily.  ? Multiple Vitamin (MULTIVITAMIN) tablet Take 1 tablet by mouth  daily.  ? nitroGLYCERIN (NITROSTAT) 0.4 MG SL tablet Place 0.4 mg under the tongue every 5 (five) minutes as needed for chest pain.  ? Omega-3 Fatty Acids (FISH OIL) 1000 MG CAPS Take 2 capsules by mouth daily.  ? OVER THE COUNTER MEDICATION Take 1 tablet by mouth daily. Med Name: Shirley Friar  ? sildenafil (VIAGRA) 50 MG tablet Take 50 mg by mouth daily as needed for erectile dysfunction.  ? ?No current facility-administered medications for this visit. (Other)  ? ?REVIEW OF SYSTEMS: ?ROS   ?Positive for: Cardiovascular, Eyes ?Negative for: Constitutional, Gastrointestinal, Neurological, Skin, Genitourinary, Musculoskeletal, HENT, Endocrine, Respiratory, Psychiatric, Allergic/Imm, Heme/Lymph ?Last edited by Kingsley Spittle, COT on 03/30/2022  8:04 AM.  ?  ? ?ALLERGIES ?No Known Allergies ? ?PAST MEDICAL HISTORY ?Past Medical History:  ?Diagnosis Date  ? ASCVD (arteriosclerotic cardiovascular disease)   ? 3 vessel  ? Clotting disorder ([place])   ? Esophageal reflux   ? Hyperlipidemia   ? Hypertension   ? ?Past Surgical History:  ?Procedure Laterality Date  ? CATARACT EXTRACTION    ? PTCA  10/14/2003  ? mid RCA  ? ?FAMILY HISTORY ?Family History  ?Problem Relation Age of Onset  ? Heart attack Father   ? Cancer Mother   ? Diabetes Sister   ? Cancer Brother   ?  Healthy Brother   ? ?SOCIAL HISTORY ?Social History  ? ?Tobacco Use  ? Smoking status: Former  ? Smokeless tobacco: Never  ?Vaping Use  ? Vaping Use: Never used  ?Substance Use Topics  ? Alcohol use: Yes  ?  Alcohol/week: 1.0 standard drink  ?  Types: 1 Glasses of wine per week  ?  Comment: 1-2 per day  ? Drug use: No  ?  ? ?  ?OPHTHALMIC EXAM: ? ?Base Eye Exam   ? ? Visual Acuity (Snellen - Linear)   ? ?   Right Left  ? Dist cc 20/20 20/20  ? ? Correction: Glasses  ? ?  ?  ? ? Tonometry (Tonopen, 8:08 AM)   ? ?   Right Left  ? Pressure 15 14  ? ?  ?  ? ? Pupils   ? ?   Dark Light Shape React APD  ? Right 3 2 Round Brisk None  ? Left 3 2 Round Brisk None  ? ?  ?   ? ? Visual Fields (Counting fingers)   ? ?   Left Right  ?   Full  ? Restrictions Partial inner superior temporal deficiency   ? ?  ?  ? ? Extraocular Movement   ? ?   Right Left  ?  Full, Ortho Full, Ortho  ? ?  ?  ? ? Neuro/Psych   ? ? Oriented x3: Yes  ? Mood/Affect: Normal  ? ?  ?  ? ? Dilation   ? ? Both eyes: 1.0% Mydriacyl, 2.5% Phenylephrine @ 8:09 AM  ? ?  ?  ? ?  ? ?Slit Lamp and Fundus Exam   ? ? Slit Lamp Exam   ? ?   Right Left  ? Lids/Lashes Normal small stye nasal LL margin  ? Conjunctiva/Sclera White and quiet White and quiet  ? Cornea arcus, well healed cataract wound, trace PEE arcus, well healed cataract wound, trace PEE  ? Anterior Chamber Deep and quiet Deep and quiet  ? Iris Round and dilated Round and dilated  ? Lens PC IOL in good position Toric PC IOL in good position with marks at 0530 and 1100  ? Anterior Vitreous Mild Vitreous syneresis, no cell or pigment, low lying Posterior vitreous detachment, Weiss ring Mild Vitreous syneresis, no cell or pigment, Posterior vitreous detachment  ? ?  ?  ? ? Fundus Exam   ? ?   Right Left  ? Disc Pink and Sharp, mild peripapillary edema - persistent, ?peripapillary CNV ST to disc -- persistent, no heme Pink and Sharp, peripapillary edema - persistent, CNV ST disc, no heme  ? C/D Ratio 0.5 0.4  ? Macula Good foveal reflex, nasal cystic changes/edema - persistent, fine drusen v exudate, no heme Good foveal reflex, mild edema nasal macula - persistent, no heme  ? Vessels mild attenuation, mild Copper wiring mild attenuation, mild Copper wiring, mild AV crossing changes  ? Periphery Attached, No heme, no RT/RD Attached, No heme   ? ?  ?  ? ?  ? ?Refraction   ? ? Wearing Rx   ? ?   Sphere Cylinder Axis Add  ? Right [place] +0.25 060 +2.25  ? Left +0.25 Sphere  +2.25  ? ?  ?  ? ?  ? ?IMAGING AND PROCEDURES  ?Imaging and Procedures for 03/30/2022 ? ?OCT, Retina - OU - Both Eyes   ? ?   ?Right Eye ?Quality was good. Central Foveal Thickness:  282. Progression has  been stable. Findings include normal foveal contour, intraretinal fluid, pigment epithelial detachment, retinal drusen , no SRF (Persistent peripapillary IRF/cystic changes).  ? ?Left Eye ?Quality was borderline. Central Foveal Thickness: 276. Progression has improved. Findings include normal foveal contour, intraretinal fluid, no SRF, retinal drusen (Persistent peripapillary IRF -- slightly improved).  ? ?Notes ?*Images captured and stored on drive ? ?Diagnosis / Impression:  ?OD: Persistent peripapillary IRF/cystic changes ?OS: Persistent peripapillary IRF -- slightly improved ? ?Clinical management:  ?See below ? ?Abbreviations: NFP - Normal foveal profile. CME - cystoid macular edema. PED - pigment epithelial detachment. IRF - intraretinal fluid. SRF - subretinal fluid. EZ - ellipsoid zone. ERM - epiretinal membrane. ORA - outer retinal atrophy. ORT - outer retinal tubulation. SRHM - subretinal hyper-reflective material. IRHM - intraretinal hyper-reflective material ? ? ?  ? ?Intravitreal Injection, Pharmacologic Agent - OS - Left Eye   ? ?   ?Time Out ?03/30/2022. 8:36 AM. Confirmed correct patient, procedure, site, and patient consented.  ? ?Anesthesia ?Topical anesthesia was used. Anesthetic medications included Lidocaine 2%, Proparacaine 0.5%.  ? ?Procedure ?Preparation included 5% betadine to ocular surface, eyelid speculum. A (32g) needle was used.  ? ?Injection: ?2 mg aflibercept 2 MG/0.05ML ?  Route: Intravitreal, Site: Left Eye ?  [place]: [id number], Lot: [id number], Expiration date: 02/09/2023, Waste: 0 mL  ? ?Post-op ?Post injection exam found visual acuity of at least counting fingers. The patient tolerated the procedure well. There were no complications. The patient received written and verbal post procedure care education. Post injection medications were not given.  ? ?  ?  ?  ? ?  ?ASSESSMENT/PLAN: ? ?  ICD-10-CM   ?1. Exudative age-related macular degeneration of both eyes with active choroidal  neovascularization ([place])  H35.3231 OCT, Retina - OU - Both Eyes  ?  Intravitreal Injection, Pharmacologic Agent - OS - Left Eye  ?  aflibercept (EYLEA) SOLN 2 mg  ?  ?2. Essential hypertension  I10   ?  ?3. Hype

## 2022-03-30 ENCOUNTER — Ambulatory Visit (INDEPENDENT_AMBULATORY_CARE_PROVIDER_SITE_OTHER): Payer: PPO | Admitting: Ophthalmology

## 2022-03-30 ENCOUNTER — Encounter (INDEPENDENT_AMBULATORY_CARE_PROVIDER_SITE_OTHER): Payer: Self-pay | Admitting: Ophthalmology

## 2022-03-30 DIAGNOSIS — H35033 Hypertensive retinopathy, bilateral: Secondary | ICD-10-CM

## 2022-03-30 DIAGNOSIS — Z961 Presence of intraocular lens: Secondary | ICD-10-CM | POA: Diagnosis not present

## 2022-03-30 DIAGNOSIS — H353231 Exudative age-related macular degeneration, bilateral, with active choroidal neovascularization: Secondary | ICD-10-CM | POA: Diagnosis not present

## 2022-03-30 DIAGNOSIS — I10 Essential (primary) hypertension: Secondary | ICD-10-CM | POA: Diagnosis not present

## 2022-03-30 MED ORDER — AFLIBERCEPT 2MG/0.05ML IZ SOLN FOR KALEIDOSCOPE
2.0000 mg | INTRAVITREAL | Status: AC | PRN
  Administered 2022-03-30: 2 mg via INTRAVITREAL

## 2022-04-15 NOTE — Progress Notes (Signed)
?[facility] Note ? ?04/27/2022 ?  ? ?CHIEF COMPLAINT ?Patient presents for Retina Follow Up ? ?HISTORY OF PRESENT ILLNESS: ?Raymond Alexander is a 76 y.o. male who presents to the clinic today for:  ? ?HPI   ? ? Retina Follow Up   ?Patient presents with  Wet AMD (IVE OS (04.18.23)).  In both eyes.  This started months ago.  Severity is moderate.  Duration of 4.  Since onset it is stable.  I, the attending physician,  performed the HPI with the patient and updated documentation appropriately. ? ?  ?  ? ? Comments   ?Patient states that the vision is the same. The right eye is compensating for the lack of in the left.  ? ?  ?  ?Last edited by Bernarda Caffey, MD on 04/27/2022  8:48 AM.  ?  ?Pt states he is seeing intermittent "green flashes" in both eyes ? ? ?Referring physician: ?Lujean Amel, MD ?[address] ?[address] ?[place],  [place] [address] ? ?HISTORICAL INFORMATION:  ? ?Selected notes from the [facility] ?Referred by Dr. Nicki Reaper for concern of retinal edema OU  ? ?CURRENT MEDICATIONS: ?No current outpatient medications on file. (Ophthalmic Drugs)  ? ?No current facility-administered medications for this visit. (Ophthalmic Drugs)  ? ?Current Outpatient Medications (Other)  ?Medication Sig  ? aspirin 81 MG tablet Take 81 mg by mouth daily.  ? atorvastatin (LIPITOR) 80 MG tablet Take 80 mg by mouth daily.  ? Coenzyme Q10 (CO Q-10) 100 MG CAPS Take 300 mg by mouth daily.  ? ezetimibe (ZETIA) 10 MG tablet Take 10 mg by mouth daily.  ? fexofenadine (ALLEGRA) 180 MG tablet Take 180 mg by mouth daily as needed for allergies.   ? fluticasone (FLONASE) 50 MCG/ACT nasal spray Place 1 spray into both nostrils daily.  ? IBUPROFEN & ACETAMINOPHEN PO Take by mouth as needed.  ? lisinopril (PRINIVIL,ZESTRIL) 10 MG tablet Take 10 mg by mouth daily.  ? Mag Aspart-Potassium Aspart (POTASSIUM & MAGNESIUM ASPARTAT) 250-250 MG CAPS Take 1 tablet by mouth daily.  ? Multiple Vitamin  (MULTIVITAMIN) tablet Take 1 tablet by mouth daily.  ? nitroGLYCERIN (NITROSTAT) 0.4 MG SL tablet Place 0.4 mg under the tongue every 5 (five) minutes as needed for chest pain.  ? Omega-3 Fatty Acids (FISH OIL) 1000 MG CAPS Take 2 capsules by mouth daily.  ? OVER THE COUNTER MEDICATION Take 1 tablet by mouth daily. Med Name: Shirley Friar  ? sildenafil (VIAGRA) 50 MG tablet Take 50 mg by mouth daily as needed for erectile dysfunction.  ? ?No current facility-administered medications for this visit. (Other)  ? ?REVIEW OF SYSTEMS: ?ROS   ?Positive for: Cardiovascular, Eyes ?Negative for: Constitutional, Gastrointestinal, Neurological, Skin, Genitourinary, Musculoskeletal, HENT, Endocrine, Respiratory, Psychiatric, Allergic/Imm, Heme/Lymph ?Last edited by Annie Paras, COT on 04/27/2022  8:02 AM.  ?  ? ?ALLERGIES ?No Known Allergies ? ?PAST MEDICAL HISTORY ?Past Medical History:  ?Diagnosis Date  ? ASCVD (arteriosclerotic cardiovascular disease)   ? 3 vessel  ? Clotting disorder ([place])   ? Esophageal reflux   ? Hyperlipidemia   ? Hypertension   ? Macular degeneration   ? ?Past Surgical History:  ?Procedure Laterality Date  ? CATARACT EXTRACTION    ? PTCA  10/14/2003  ? mid RCA  ? ?FAMILY HISTORY ?Family History  ?Problem Relation Age of Onset  ? Heart attack Father   ? Cancer Mother   ? Diabetes Sister   ?  Cancer Brother   ? Healthy Brother   ? ?SOCIAL HISTORY ?Social History  ? ?Tobacco Use  ? Smoking status: Former  ? Smokeless tobacco: Never  ?Vaping Use  ? Vaping Use: Never used  ?Substance Use Topics  ? Alcohol use: Yes  ?  Alcohol/week: 1.0 standard drink  ?  Types: 1 Glasses of wine per week  ?  Comment: 1-2 per day  ? Drug use: No  ?  ? ?  ?OPHTHALMIC EXAM: ? ?Base Eye Exam   ? ? Visual Acuity (Snellen - Linear)   ? ?   Right Left  ? Dist cc 20/20 +2 20/25  ? ? Correction: Glasses  ? ?  ?  ? ? Tonometry (Tonopen, 8:07 AM)   ? ?   Right Left  ? Pressure 11 15  ? ?  ?  ? ? Pupils   ? ?   Dark Light Shape React  APD  ? Right 4 3 Round Brisk None  ? Left 4 3 Round Brisk None  ? ?  ?  ? ? Visual Fields   ? ?   Left Right  ?   Full  ? Restrictions Partial inner superior temporal deficiency   ? ?  ?  ? ? Extraocular Movement   ? ?   Right Left  ?  Full, Ortho Full, Ortho  ? ?  ?  ? ? Neuro/Psych   ? ? Oriented x3: Yes  ? Mood/Affect: Normal  ? ?  ?  ? ? Dilation   ? ? Both eyes: 1.0% Mydriacyl, 2.5% Phenylephrine @ 8:04 AM  ? ?  ?  ? ?  ? ?Slit Lamp and Fundus Exam   ? ? Slit Lamp Exam   ? ?   Right Left  ? Lids/Lashes Normal small stye nasal LL margin  ? Conjunctiva/Sclera White and quiet White and quiet  ? Cornea arcus, well healed cataract wound, trace PEE arcus, well healed cataract wound, trace PEE  ? Anterior Chamber Deep and quiet Deep and quiet  ? Iris Round and dilated Round and dilated  ? Lens PC IOL in good position Toric PC IOL in good position with marks at 0530 and 1100  ? Anterior Vitreous Mild Vitreous syneresis, no cell or pigment, low lying Posterior vitreous detachment, Weiss ring Mild Vitreous syneresis, no cell or pigment, Posterior vitreous detachment  ? ?  ?  ? ? Fundus Exam   ? ?   Right Left  ? Disc Pink and Sharp, mild peripapillary edema - improved, ?peripapillary CNV ST to disc -- persistent, no heme Pink and Sharp, peripapillary edema - improved, CNV ST disc, no heme  ? C/D Ratio 0.5 0.4  ? Macula Good foveal reflex, nasal cystic changes/edema - slightly improved, fine drusen v exudate, no heme Good foveal reflex, mild edema nasal macula - improved, no heme  ? Vessels mild attenuation, mild Copper wiring mild attenuation, mild Copper wiring, mild AV crossing changes  ? Periphery Attached, No heme, no RT/RD Attached, No heme   ? ?  ?  ? ?  ? ?Refraction   ? ? Wearing Rx   ? ?   Sphere Cylinder Axis Add  ? Right [place] +0.25 060 +2.25  ? Left +0.25 Sphere  +2.25  ? ?  ?  ? ?  ? ?IMAGING AND PROCEDURES  ?Imaging and Procedures for 04/27/2022 ? ?OCT, Retina - OU - Both Eyes   ? ?   ?Right Eye ?Quality  was  good. Central Foveal Thickness: 278. Progression has improved. Findings include normal foveal contour, intraretinal fluid, pigment epithelial detachment, retinal drusen , no SRF (Interval improvement in peripapillary IRF/cystic changes).  ? ?Left Eye ?Quality was borderline. Central Foveal Thickness: 268. Progression has improved. Findings include normal foveal contour, intraretinal fluid, no SRF, retinal drusen (Mild interval improvement in peripapillary IRF/cystic changes).  ? ?Notes ?*Images captured and stored on drive ? ?Diagnosis / Impression:  ?Interval improvement in peripapillary IRF/cystic changes OU ? ?Clinical management:  ?See below ? ?Abbreviations: NFP - Normal foveal profile. CME - cystoid macular edema. PED - pigment epithelial detachment. IRF - intraretinal fluid. SRF - subretinal fluid. EZ - ellipsoid zone. ERM - epiretinal membrane. ORA - outer retinal atrophy. ORT - outer retinal tubulation. SRHM - subretinal hyper-reflective material. IRHM - intraretinal hyper-reflective material ? ? ?  ? ?Intravitreal Injection, Pharmacologic Agent - OS - Left Eye   ? ?   ?Time Out ?04/27/2022. 8:38 AM. Confirmed correct patient, procedure, site, and patient consented.  ? ?Anesthesia ?Topical anesthesia was used. Anesthetic medications included Lidocaine 2%, Proparacaine 0.5%.  ? ?Procedure ?Preparation included 5% betadine to ocular surface, eyelid speculum. A (32g) needle was used.  ? ?Injection: ?2 mg aflibercept 2 MG/0.05ML ?  Route: Intravitreal, Site: Left Eye ?  [place]: [id number], Lot: [id number], Expiration date: 02/09/2023, Waste: 0 mL  ? ?Post-op ?Post injection exam found visual acuity of at least counting fingers. The patient tolerated the procedure well. There were no complications. The patient received written and verbal post procedure care education. Post injection medications were not given.  ? ?  ? ?  ?  ? ?  ?ASSESSMENT/PLAN: ? ?  ICD-10-CM   ?1. Exudative age-related macular degeneration of  both eyes with active choroidal neovascularization ([place])  H35.3231 OCT, Retina - OU - Both Eyes  ?  Intravitreal Injection, Pharmacologic Agent - OS - Left Eye  ?  aflibercept (EYLEA) SOLN 2 mg  ?  ?2. Essential

## 2022-04-18 NOTE — Progress Notes (Signed)
?Cardiology Office Note:   ? ?Date:  04/22/2022  ? ?ID:  Raymond Alexander, DOB [birth year], MRN [id number] ? ?PCP:  Lujean Amel, MD  ?Cardiologist:  Sinclair Grooms, MD  ? ?Referring MD: Lujean Amel, MD  ? ?Chief Complaint  ?Patient presents with  ? Coronary Artery Disease  ? Hyperlipidemia  ? ? ?History of Present Illness:   ? ?Raymond Alexander is a 76 y.o. male with a hx of CAD (mid RCA DES 2004; total occlusion of septal perforator with collaterals; 85% obtuse marginal), nonischemic exercise treadmill test 2017, hyperlipidemia , and primary hypertension. ? ?He is doing well.  Denies angina.  Just returned from [place].  Did a lot of hiking and even hiked at high altitude without chest discomfort, dyspnea, or leg fatigue.  He denies orthopnea, PND, medication side effects. ? ?Past Medical History:  ?Diagnosis Date  ? ASCVD (arteriosclerotic cardiovascular disease)   ? 3 vessel  ? Clotting disorder ([place])   ? Esophageal reflux   ? Hyperlipidemia   ? Hypertension   ? ? ?Past Surgical History:  ?Procedure Laterality Date  ? CATARACT EXTRACTION    ? PTCA  10/14/2003  ? mid RCA  ? ? ?Current Medications: ?Current Meds  ?Medication Sig  ? aspirin 81 MG tablet Take 81 mg by mouth daily.  ? atorvastatin (LIPITOR) 80 MG tablet Take 80 mg by mouth daily.  ? Coenzyme Q10 (CO Q-10) 100 MG CAPS Take 300 mg by mouth daily.  ? ezetimibe (ZETIA) 10 MG tablet Take 10 mg by mouth daily.  ? fexofenadine (ALLEGRA) 180 MG tablet Take 180 mg by mouth daily as needed for allergies.   ? fluticasone (FLONASE) 50 MCG/ACT nasal spray Place 1 spray into both nostrils daily.  ? IBUPROFEN & ACETAMINOPHEN PO Take by mouth as needed.  ? lisinopril (PRINIVIL,ZESTRIL) 10 MG tablet Take 10 mg by mouth daily.  ? Mag Aspart-Potassium Aspart (POTASSIUM & MAGNESIUM ASPARTAT) 250-250 MG CAPS Take 1 tablet by mouth daily.  ? Multiple Vitamin (MULTIVITAMIN) tablet Take 1 tablet by mouth daily.  ? nitroGLYCERIN (NITROSTAT) 0.4 MG SL tablet Place 0.4 mg under  the tongue every 5 (five) minutes as needed for chest pain.  ? Omega-3 Fatty Acids (FISH OIL) 1000 MG CAPS Take 2 capsules by mouth daily.  ? OVER THE COUNTER MEDICATION Take 1 tablet by mouth daily. Med Name: Shirley Friar  ? sildenafil (VIAGRA) 50 MG tablet Take 50 mg by mouth daily as needed for erectile dysfunction.  ?  ? ?Allergies:   Patient has no known allergies.  ? ?Social History  ? ?Socioeconomic History  ? Marital status: Married  ?  Spouse name: Not on file  ? Number of children: Not on file  ? Years of education: Not on file  ? Highest education level: Not on file  ?Occupational History  ? Not on file  ?Tobacco Use  ? Smoking status: Former  ? Smokeless tobacco: Never  ?Vaping Use  ? Vaping Use: Never used  ?Substance and Sexual Activity  ? Alcohol use: Yes  ?  Alcohol/week: 1.0 standard drink  ?  Types: 1 Glasses of wine per week  ?  Comment: 1-2 per day  ? Drug use: No  ? Sexual activity: Yes  ?Other Topics Concern  ? Not on file  ?Social History Narrative  ? Not on file  ? ?Social Determinants of Health  ? ?Financial Resource Strain: Not on file  ?Food Insecurity: Not on file  ?Transportation  Needs: Not on file  ?Physical Activity: Not on file  ?Stress: Not on file  ?Social Connections: Not on file  ?  ? ?Family History: ?The patient's family history includes Cancer in his brother and mother; Diabetes in his sister; Healthy in his brother; Heart attack in his father. ? ?ROS:   ?Please see the history of present illness.    ?Breathless at times when he is speaking and gets twinges of chest pain in the sternum that stretching relieves.  All other systems reviewed and are negative. ? ?EKGs/Labs/Other Studies Reviewed:   ? ?The following studies were reviewed today: ?Exercise treadmill test December 2021: ?Study Highlights ? ?  ?The patient exercised on the BRUCE protocol for 83mn achieving 10.1 METs ?Resting heart rate was 45bpm which rose to a max of 127bpm which represents 88% of the max, age-predicted  HR ?Blood pressure demonstrated a normal response to exercise. ?Rare PVCs and PACs during stress and recovery ?Downsloping ST segment depression was noted during stress in the V5, V6, II, III and aVF leads, and returning to baseline after 1-5 minutes of recovery. ?Findings suggestive of possible inferior/inferolateral ischemia. ?  ?HGwyndolyn Kaufman MD ?  ? ?EKG:  EKG sinus bradycardia 52 bpm.  Lateral and inferior T wave inversion as well as T wave inversion V1 and V2.  When compared to the prior tracing, the T wave changes are new.  Heart rate is faster than prior. ? ?Recent Labs: ?No results found for requested labs within last 8760 hours.  ?Recent Lipid Panel ?No results found for: CHOL, TRIG, HDL, CHOLHDL, VLDL, LDLCALC, LDLDIRECT ? ?Physical Exam:   ? ?VS:  BP (!) 102/58   Pulse (!) 52   Ht '5\' 10"'$  (1.778 m)   Wt 178 lb 9.6 oz (81 kg)   SpO2 96%   BMI 25.63 kg/m?    ? ?Wt Readings from Last 3 Encounters:  ?04/22/22 178 lb 9.6 oz (81 kg)  ?11/05/20 179 lb 3.2 oz (81.3 kg)  ?10/24/19 215 lb 6.4 oz (97.7 kg)  ?  ? ?GEN: Healthy appearing. No acute distress ?HEENT: Normal ?NECK: No JVD. ?LYMPHATICS: No lymphadenopathy ?CARDIAC: No murmur. RRR no gallop, or edema. ?VASCULAR:  Normal Pulses. No bruits. ?RESPIRATORY:  Clear to auscultation without rales, wheezing or rhonchi  ?ABDOMEN: Soft, non-tender, non-distended, No pulsatile mass, ?MUSCULOSKELETAL: No deformity  ?SKIN: Warm and dry ?NEUROLOGIC:  Alert and oriented x 3 ?PSYCHIATRIC:  Normal affect  ? ?ASSESSMENT:   ? ?1. Atherosclerosis of native coronary artery of native heart without angina pectoris   ?2. Essential hypertension, benign   ?3. Other hyperlipidemia   ?4. Slow heart rate   ? ?PLAN:   ? ?In order of problems listed above: ? ?Secondary prevention reviewed.  Encouraged to continue excellent physical activity strategy. ?Blood pressure control is good.  No change. ?Continue statin therapy as noted. ?The heart rate is unchanged, actually slightly  faster than a year ago. ? ? ?Overall education and awareness concerning secondary risk prevention was discussed in detail: LDL less than 70, hemoglobin A1c less than 7, blood pressure target less than 130/80 mmHg, >150 minutes of moderate aerobic activity per week, avoidance of smoking, weight control (via diet and exercise), and continued surveillance/management of/for obstructive sleep apnea. ? ? ? ?Medication Adjustments/Labs and Tests Ordered: ?Current medicines are reviewed at length with the patient today.  Concerns regarding medicines are outlined above.  ?Orders Placed This Encounter  ?Procedures  ? EKG 12-Lead  ? ?No orders of  the defined types were placed in this encounter. ? ? ?Patient Instructions  ?Medication Instructions:  ?Your physician recommends that you continue on your current medications as directed. Please refer to the Current Medication list given to you today. ? ?*If you need a refill on your cardiac medications before your next appointment, please call your pharmacy* ? ?Lab Work: ?NONE ? ?Testing/Procedures: ?NONE ? ?Follow-Up: ?At [facility], you and your health needs are our priority.  As part of our continuing mission to provide you with exceptional heart care, we have created designated Provider Care Teams.  These Care Teams include your primary Cardiologist (physician) and Advanced Practice Providers (APPs -  Physician Assistants and Nurse Practitioners) who all work together to provide you with the care you need, when you need it. ? ?Your next appointment:   ?1 year(s) ? ?The format for your next appointment:   ?In Person ? ?Provider:   ?Sinclair Grooms, MD { ? ? ?Important Information About Sugar ? ? ? ? ?   ? ?Signed, ?Sinclair Grooms, MD  ?04/22/2022 4:54 PM    ?[place] ?

## 2022-04-22 ENCOUNTER — Encounter: Payer: Self-pay | Admitting: Interventional Cardiology

## 2022-04-22 ENCOUNTER — Ambulatory Visit: Payer: PPO | Admitting: Interventional Cardiology

## 2022-04-22 VITALS — BP 102/58 | HR 52 | Ht 70.0 in | Wt 178.6 lb

## 2022-04-22 DIAGNOSIS — I10 Essential (primary) hypertension: Secondary | ICD-10-CM | POA: Diagnosis not present

## 2022-04-22 DIAGNOSIS — I2510 Atherosclerotic heart disease of native coronary artery without angina pectoris: Secondary | ICD-10-CM

## 2022-04-22 DIAGNOSIS — R001 Bradycardia, unspecified: Secondary | ICD-10-CM

## 2022-04-22 DIAGNOSIS — E7849 Other hyperlipidemia: Secondary | ICD-10-CM

## 2022-04-22 NOTE — Patient Instructions (Signed)

## 2022-04-27 ENCOUNTER — Encounter (INDEPENDENT_AMBULATORY_CARE_PROVIDER_SITE_OTHER): Payer: Self-pay | Admitting: Ophthalmology

## 2022-04-27 ENCOUNTER — Ambulatory Visit (INDEPENDENT_AMBULATORY_CARE_PROVIDER_SITE_OTHER): Payer: PPO | Admitting: Ophthalmology

## 2022-04-27 DIAGNOSIS — H539 Unspecified visual disturbance: Secondary | ICD-10-CM

## 2022-04-27 DIAGNOSIS — Z961 Presence of intraocular lens: Secondary | ICD-10-CM

## 2022-04-27 DIAGNOSIS — I10 Essential (primary) hypertension: Secondary | ICD-10-CM

## 2022-04-27 DIAGNOSIS — H353231 Exudative age-related macular degeneration, bilateral, with active choroidal neovascularization: Secondary | ICD-10-CM

## 2022-04-27 DIAGNOSIS — H35033 Hypertensive retinopathy, bilateral: Secondary | ICD-10-CM

## 2022-04-27 MED ORDER — AFLIBERCEPT 2MG/0.05ML IZ SOLN FOR KALEIDOSCOPE
2.0000 mg | INTRAVITREAL | Status: AC | PRN
  Administered 2022-04-27: 2 mg via INTRAVITREAL

## 2022-05-11 NOTE — Progress Notes (Addendum)
[facility] Note  05/25/2022    CHIEF COMPLAINT Patient presents for Retina Follow Up  HISTORY OF PRESENT ILLNESS: Raymond Alexander is a 76 y.o. male who presents to the clinic today for:   HPI     Retina Follow Up   Patient presents with  Wet AMD.  In both eyes.  This started 4 weeks ago.  I, the attending physician,  performed the HPI with the patient and updated documentation appropriately.        Comments   Patient here for 4 weeks retina follow up for exu ARMD OU. Patient states vision doing ok. Still getting green flashes on occasion. No eye pain.       Last edited by Bernarda Caffey, MD on 05/25/2022 10:29 PM.       Referring physician: Lujean Amel, MD [address],  [place] [address]  HISTORICAL INFORMATION:   Selected notes from the MEDICAL RECORD NUMBER Referred by Dr. Nicki Reaper for concern of retinal edema OU   CURRENT MEDICATIONS: No current outpatient medications on file. (Ophthalmic Drugs)   No current facility-administered medications for this visit. (Ophthalmic Drugs)   Current Outpatient Medications (Other)  Medication Sig   aspirin 81 MG tablet Take 81 mg by mouth daily.   atorvastatin (LIPITOR) 80 MG tablet Take 80 mg by mouth daily.   Coenzyme Q10 (CO Q-10) 100 MG CAPS Take 300 mg by mouth daily.   ezetimibe (ZETIA) 10 MG tablet Take 10 mg by mouth daily.   fexofenadine (ALLEGRA) 180 MG tablet Take 180 mg by mouth daily as needed for allergies.    fluticasone (FLONASE) 50 MCG/ACT nasal spray Place 1 spray into both nostrils daily.   IBUPROFEN & ACETAMINOPHEN PO Take by mouth as needed.   lisinopril (PRINIVIL,ZESTRIL) 10 MG tablet Take 10 mg by mouth daily.   Mag Aspart-Potassium Aspart (POTASSIUM & MAGNESIUM ASPARTAT) 250-250 MG CAPS Take 1 tablet by mouth daily.   Multiple Vitamin (MULTIVITAMIN) tablet Take 1 tablet by mouth daily.   nitroGLYCERIN (NITROSTAT) 0.4 MG SL tablet Place 0.4 mg under  the tongue every 5 (five) minutes as needed for chest pain.   Omega-3 Fatty Acids (FISH OIL) 1000 MG CAPS Take 2 capsules by mouth daily.   OVER THE COUNTER MEDICATION Take 1 tablet by mouth daily. Med Name: PROTANDIM   sildenafil (VIAGRA) 50 MG tablet Take 50 mg by mouth daily as needed for erectile dysfunction.   No current facility-administered medications for this visit. (Other)   REVIEW OF SYSTEMS: ROS   Positive for: Cardiovascular, Eyes Negative for: Constitutional, Gastrointestinal, Neurological, Skin, Genitourinary, Musculoskeletal, HENT, Endocrine, Respiratory, Psychiatric, Allergic/Imm, Heme/Lymph Last edited by Theodore Demark, COA on 05/25/2022  1:35 PM.     ALLERGIES No Known Allergies  PAST MEDICAL HISTORY Past Medical History:  Diagnosis Date   ASCVD (arteriosclerotic cardiovascular disease)    3 vessel   Clotting disorder ([place])    Esophageal reflux    Hyperlipidemia    Hypertension    Macular degeneration    Past Surgical History:  Procedure Laterality Date   CATARACT EXTRACTION     PTCA  10/14/2003   mid RCA   FAMILY HISTORY Family History  Problem Relation Age of Onset   Heart attack Father    Cancer Mother    Diabetes Sister    Cancer Brother    Healthy Brother    SOCIAL HISTORY Social History   Tobacco Use  Smoking status: Former   Smokeless tobacco: Never  Vaping Use   Vaping Use: Never used  Substance Use Topics   Alcohol use: Yes    Alcohol/week: 1.0 standard drink of alcohol    Types: 1 Glasses of wine per week    Comment: 1-2 per day   Drug use: No       OPHTHALMIC EXAM:  Base Eye Exam     Visual Acuity (Snellen - Linear)       Right Left   Dist cc 20/20 20/25 -2   Dist ph cc  20/20    Correction: Glasses         Tonometry (Tonopen, 1:32 PM)       Right Left   Pressure 15 14         Pupils       Dark Light Shape React APD   Right 4 3 Round Brisk None   Left 4 3 Round Brisk None         Visual  Fields (Counting fingers)       Left Right     Full   Restrictions Partial inner superior temporal deficiency          Extraocular Movement       Right Left    Full, Ortho Full, Ortho         Neuro/Psych     Oriented x3: Yes   Mood/Affect: Normal         Dilation     Both eyes: 1.0% Mydriacyl, 2.5% Phenylephrine @ 1:32 PM           Slit Lamp and Fundus Exam     Slit Lamp Exam       Right Left   Lids/Lashes Normal small stye nasal LL margin   Conjunctiva/Sclera White and quiet White and quiet   Cornea arcus, well healed cataract wound, trace PEE arcus, well healed cataract wound, trace PEE   Anterior Chamber Deep and quiet Deep and quiet   Iris Round and dilated Round and dilated   Lens PC IOL in good position Toric PC IOL in good position with marks at 0530 and 1100   Anterior Vitreous Mild Vitreous syneresis, no cell or pigment, low lying Posterior vitreous detachment, Weiss ring Mild Vitreous syneresis, no cell or pigment, Posterior vitreous detachment         Fundus Exam       Right Left   Disc Pink and Sharp, mild peripapillary edema - slightly increased ?peripapillary CNV ST to disc -- persistent, no heme Pink and Sharp, peripapillary edema -- persistent, CNV ST disc, no heme   C/D Ratio 0.5 0.4   Macula Good foveal reflex, nasal cystic changes/edema - slightly increased, fine drusen v exudate, no heme Good foveal reflex, mild edema nasal macula -- persistent, no heme   Vessels mild attenuation, mild Copper wiring mild attenuation, mild Copper wiring, mild AV crossing changes   Periphery Attached, No heme, no RT/RD Attached, No heme            Refraction     Wearing Rx       Sphere Cylinder Axis Add   Right [place] +0.25 060 +2.25   Left +0.25 Sphere  +2.25           IMAGING AND PROCEDURES  Imaging and Procedures for 05/25/2022  OCT, Retina - OU - Both Eyes       Right Eye Quality was good. Central Foveal Thickness: 289.  Progression  has worsened. Findings include normal foveal contour, no SRF, retinal drusen , intraretinal fluid, pigment epithelial detachment (Mild Interval increase in peripapillary IRF/cystic changes).   Left Eye Quality was borderline. Central Foveal Thickness: 274. Progression has been stable. Findings include normal foveal contour, no SRF, retinal drusen , intraretinal fluid (Persistent peripapillary IRF/cystic changes -- minimal improvement).   Notes *Images captured and stored on drive  Diagnosis / Impression:  OD Mild Interval increase in peripapillary IRF/cystic changes  OS Persistent peripapillary IRF/cystic changes -- minimal improvement  Clinical management:  See below  Abbreviations: NFP - Normal foveal profile. CME - cystoid macular edema. PED - pigment epithelial detachment. IRF - intraretinal fluid. SRF - subretinal fluid. EZ - ellipsoid zone. ERM - epiretinal membrane. ORA - outer retinal atrophy. ORT - outer retinal tubulation. SRHM - subretinal hyper-reflective material. IRHM - intraretinal hyper-reflective material      Intravitreal Injection, Pharmacologic Agent - OS - Left Eye       Time Out 05/25/2022. 2:03 PM. Confirmed correct patient, procedure, site, and patient consented.   Anesthesia Topical anesthesia was used. Anesthetic medications included Lidocaine 2%, Proparacaine 0.5%.   Procedure Preparation included 5% betadine to ocular surface, eyelid speculum. A (32g) needle was used.   Injection: 2 mg aflibercept 2 MG/0.05ML   Route: Intravitreal, Site: Left Eye   NDC: [id number], Lot: [id number], Expiration date: 03/12/2023, Waste: 0 mL   Post-op Post injection exam found visual acuity of at least counting fingers. The patient tolerated the procedure well. There were no complications. The patient received written and verbal post procedure care education. Post injection medications were not given.            ASSESSMENT/PLAN:    ICD-10-CM   1.  Exudative age-related macular degeneration of both eyes with active choroidal neovascularization (HCC)  H35.3231 OCT, Retina - OU - Both Eyes    Intravitreal Injection, Pharmacologic Agent - OS - Left Eye    aflibercept (EYLEA) SOLN 2 mg    2. Essential hypertension  I10     3. Hypertensive retinopathy of both eyes  H35.033     4. Pseudophakia, both eyes  Z96.1       1. Peripapillary CNV / ex ARMD OU  - 8.1.22 pt with 1 wk history of decreased vision OS and isolated light green flashes OD -- flashes OD resolved, blurriness OS improving  - 10.04.22 small recurrent episode of flashes OD last week.   - 11.15.22 subjectively blurred vision OS             - s/p IVA OS #1 (11.15.22), #2 (12.13.22), #3 (01.10.23), #4 (02.09.23) -- IVA resistance  - s/p IVE OS #1 (04.18.23), #2 (05.16.23) - BCVA 20/20 OU - stable  - exam and OCT shows OD mild Interval increase in peripapillary IRF/cystic changes, OS Persistent peripapillary IRF/cystic changes -- minimal improvement  - FA (08.23.22) shows mild peripapillary staining and leakage OU -- +CNV  - unclear etiology, but suspect ARMD vs idiopathic CNV; post-COVID vaccine? -- pt received 2nd booster end of Sept 2022  - recommend IVE OS #3 today, 06.13.23  - pt wishes to proceed with injection  - RBA of procedure discussed, questions answered - IVA informed consent obtained and signed, 11.15.22 (OS) - IVE informed consent obtained and signed, 04.18.23 (OS) - see procedure note  - Amsler grid given to closely monitor for vision changes  - f/u 4 weeks, DFE, OCT, possible injection  2,3. Hypertensive retinopathy OU - discussed importance of  tight BP control - monitor  4. Pseudophakia OU  - s/p CE/IOL (Dr. Talbert Forest, 2022)  - IOLs in good position, doing well - monitor  Ophthalmic Meds Ordered this visit:  Meds ordered this encounter  Medications   aflibercept (EYLEA) SOLN 2 mg     Return in about 4 weeks (around 06/22/2022) for DFE, OCT, possible  injection.  There are no Patient Instructions on file for this visit.  Explained the diagnoses, plan, and follow up with the patient and they expressed understanding.  Patient expressed understanding of the importance of proper follow up care.    This document serves as a record of services personally performed by Gardiner Sleeper, MD, PhD. It was created on their behalf by Renaldo Reel, [place] an ophthalmic technician. The creation of this record is the provider's dictation and/or activities during the visit.    Electronically signed by:  Renaldo Reel, COT  05/11/22  10:42 PM   This document serves as a record of services personally performed by Gardiner Sleeper, MD, PhD. It was created on their behalf by Leonie Douglas, an ophthalmic technician. The creation of this record is the provider's dictation and/or activities during the visit.    Electronically signed by: Leonie Douglas COA, 05/25/22  10:42 PM  Gardiner Sleeper, M.D., Ph.D. Diseases & Surgery of the Retina and Vitreous [facility]  I have reviewed the above documentation for accuracy and completeness, and I agree with the above. Gardiner Sleeper, M.D., Ph.D. 05/25/22 10:42 PM   Abbreviations: M myopia (nearsighted); A astigmatism; H hyperopia (farsighted); P presbyopia; Mrx spectacle prescription;  CTL contact lenses; OD right eye; OS left eye; OU both eyes  XT exotropia; ET esotropia; PEK punctate epithelial keratitis; PEE punctate epithelial erosions; DES dry eye syndrome; MGD meibomian gland dysfunction; ATs artificial tears; PFAT's preservative free artificial tears; [place] nuclear sclerotic cataract; PSC posterior subcapsular cataract; ERM epi-retinal membrane; PVD posterior vitreous detachment; RD retinal detachment; DM diabetes mellitus; DR diabetic retinopathy; NPDR non-proliferative diabetic retinopathy; PDR proliferative diabetic retinopathy; CSME clinically significant macular edema; DME diabetic  macular edema; dbh dot blot hemorrhages; CWS cotton wool spot; POAG primary open angle glaucoma; C/D cup-to-disc ratio; HVF humphrey visual field; GVF goldmann visual field; OCT optical coherence tomography; IOP intraocular pressure; BRVO Branch retinal vein occlusion; CRVO central retinal vein occlusion; CRAO central retinal artery occlusion; BRAO branch retinal artery occlusion; RT retinal tear; SB scleral buckle; PPV pars plana vitrectomy; VH Vitreous hemorrhage; PRP panretinal laser photocoagulation; IVK intravitreal kenalog; VMT vitreomacular traction; MH Macular hole;  NVD neovascularization of the disc; NVE neovascularization elsewhere; AREDS age related eye disease study; ARMD age related macular degeneration; POAG primary open angle glaucoma; EBMD epithelial/anterior basement membrane dystrophy; ACIOL anterior chamber intraocular lens; IOL intraocular lens; PCIOL posterior chamber intraocular lens; Phaco/IOL phacoemulsification with intraocular lens placement; [place] photorefractive keratectomy; LASIK laser assisted in situ keratomileusis; HTN hypertension; DM diabetes mellitus; COPD chronic obstructive pulmonary disease

## 2022-05-25 ENCOUNTER — Ambulatory Visit (INDEPENDENT_AMBULATORY_CARE_PROVIDER_SITE_OTHER): Payer: PPO | Admitting: Ophthalmology

## 2022-05-25 ENCOUNTER — Encounter (INDEPENDENT_AMBULATORY_CARE_PROVIDER_SITE_OTHER): Payer: Self-pay | Admitting: Ophthalmology

## 2022-05-25 DIAGNOSIS — Z961 Presence of intraocular lens: Secondary | ICD-10-CM | POA: Diagnosis not present

## 2022-05-25 DIAGNOSIS — I10 Essential (primary) hypertension: Secondary | ICD-10-CM | POA: Diagnosis not present

## 2022-05-25 DIAGNOSIS — H539 Unspecified visual disturbance: Secondary | ICD-10-CM

## 2022-05-25 DIAGNOSIS — H353231 Exudative age-related macular degeneration, bilateral, with active choroidal neovascularization: Secondary | ICD-10-CM

## 2022-05-25 DIAGNOSIS — H35033 Hypertensive retinopathy, bilateral: Secondary | ICD-10-CM

## 2022-05-25 MED ORDER — AFLIBERCEPT 2MG/0.05ML IZ SOLN FOR KALEIDOSCOPE
2.0000 mg | INTRAVITREAL | Status: AC | PRN
  Administered 2022-05-25: 2 mg via INTRAVITREAL

## 2022-06-23 NOTE — Progress Notes (Signed)
[facility] Note  06/28/2022    CHIEF COMPLAINT Patient presents for Retina Follow Up  HISTORY OF PRESENT ILLNESS: Raymond Alexander is a 76 y.o. male who presents to the clinic today for:   HPI     Retina Follow Up   Patient presents with  Wet AMD.  In both eyes.  This started 4 weeks ago.  I, the attending physician,  performed the HPI with the patient and updated documentation appropriately.        Comments   Patient here for 4 weeks retina follow up for exu ARMD OUI. Patient states vision doing ok. No eye pain. Did have eye pain after last injection for a day.       Last edited by Bernarda Caffey, MD on 06/28/2022 11:56 AM.    Pt states vision is doing well  Referring physician: Lujean Amel, MD [address],  [place] [address]  HISTORICAL INFORMATION:   Selected notes from the MEDICAL RECORD NUMBER Referred by Dr. Nicki Reaper for concern of retinal edema OU   CURRENT MEDICATIONS: No current outpatient medications on file. (Ophthalmic Drugs)   No current facility-administered medications for this visit. (Ophthalmic Drugs)   Current Outpatient Medications (Other)  Medication Sig   aspirin 81 MG tablet Take 81 mg by mouth daily.   atorvastatin (LIPITOR) 80 MG tablet Take 80 mg by mouth daily.   Coenzyme Q10 (CO Q-10) 100 MG CAPS Take 300 mg by mouth daily.   ezetimibe (ZETIA) 10 MG tablet Take 10 mg by mouth daily.   fexofenadine (ALLEGRA) 180 MG tablet Take 180 mg by mouth daily as needed for allergies.    fluticasone (FLONASE) 50 MCG/ACT nasal spray Place 1 spray into both nostrils daily.   IBUPROFEN & ACETAMINOPHEN PO Take by mouth as needed.   lisinopril (PRINIVIL,ZESTRIL) 10 MG tablet Take 10 mg by mouth daily.   Mag Aspart-Potassium Aspart (POTASSIUM & MAGNESIUM ASPARTAT) 250-250 MG CAPS Take 1 tablet by mouth daily.   Multiple Vitamin (MULTIVITAMIN) tablet Take 1 tablet by mouth daily.   nitroGLYCERIN (NITROSTAT)  0.4 MG SL tablet Place 0.4 mg under the tongue every 5 (five) minutes as needed for chest pain.   Omega-3 Fatty Acids (FISH OIL) 1000 MG CAPS Take 2 capsules by mouth daily.   OVER THE COUNTER MEDICATION Take 1 tablet by mouth daily. Med Name: PROTANDIM   sildenafil (VIAGRA) 50 MG tablet Take 50 mg by mouth daily as needed for erectile dysfunction.   No current facility-administered medications for this visit. (Other)   REVIEW OF SYSTEMS: ROS   Positive for: Cardiovascular, Eyes Negative for: Constitutional, Gastrointestinal, Neurological, Skin, Genitourinary, Musculoskeletal, HENT, Endocrine, Respiratory, Psychiatric, Allergic/Imm, Heme/Lymph Last edited by Theodore Demark, COA on 06/28/2022  9:35 AM.     ALLERGIES No Known Allergies  PAST MEDICAL HISTORY Past Medical History:  Diagnosis Date   ASCVD (arteriosclerotic cardiovascular disease)    3 vessel   Clotting disorder ([place])    Esophageal reflux    Hyperlipidemia    Hypertension    Macular degeneration    Past Surgical History:  Procedure Laterality Date   CATARACT EXTRACTION     PTCA  10/14/2003   mid RCA   FAMILY HISTORY Family History  Problem Relation Age of Onset   Heart attack Father    Cancer Mother    Diabetes Sister    Cancer Brother    Healthy Brother    SOCIAL  HISTORY Social History   Tobacco Use   Smoking status: Former   Smokeless tobacco: Never  Scientific laboratory technician Use: Never used  Substance Use Topics   Alcohol use: Yes    Alcohol/week: 1.0 standard drink of alcohol    Types: 1 Glasses of wine per week    Comment: 1-2 per day   Drug use: No       OPHTHALMIC EXAM:  Base Eye Exam     Visual Acuity (Snellen - Linear)       Right Left   Dist cc 20/20 20/20    Correction: Glasses         Tonometry (Tonopen, 9:33 AM)       Right Left   Pressure 07 09         Pupils       Dark Light Shape React APD   Right 4 3 Round Brisk None   Left 4 3 Round Brisk None          Visual Fields (Counting fingers)       Left Right     Full   Restrictions Partial inner superior temporal deficiency          Extraocular Movement       Right Left    Full, Ortho Full, Ortho         Neuro/Psych     Oriented x3: Yes   Mood/Affect: Normal         Dilation     Both eyes: 1.0% Mydriacyl, 2.5% Phenylephrine @ 9:33 AM           Slit Lamp and Fundus Exam     Slit Lamp Exam       Right Left   Lids/Lashes Normal small stye nasal LL margin   Conjunctiva/Sclera White and quiet White and quiet   Cornea arcus, well healed cataract wound, trace PEE arcus, well healed cataract wound, trace PEE   Anterior Chamber Deep and quiet Deep and quiet   Iris Round and dilated Round and dilated   Lens PC IOL in good position Toric PC IOL in good position with marks at 0530 and 1100   Anterior Vitreous Mild Vitreous syneresis, no cell or pigment, low lying Posterior vitreous detachment, Weiss ring Mild Vitreous syneresis, no cell or pigment, Posterior vitreous detachment         Fundus Exam       Right Left   Disc Pink and Sharp, mild peripapillary edema - slightly increased ?peripapillary CNV ST to disc -- persistent, no heme Pink and Sharp, peripapillary edema -- slightly improved, CNV ST disc, no heme   C/D Ratio 0.5 0.4   Macula Good foveal reflex, nasal cystic changes/edema - slightly increased, fine drusen v exudate, no heme Good foveal reflex, mild edema nasal macula -- slightly improved, no heme   Vessels attenuated, mild tortuosity, mild copper wiring mild attenuation, mild Copper wiring, mild AV crossing changes   Periphery Attached, No heme, no RT/RD Attached, No heme            Refraction     Wearing Rx       Sphere Cylinder Axis Add   Right [place] +0.25 060 +2.25   Left +0.25 Sphere  +2.25           IMAGING AND PROCEDURES  Imaging and Procedures for 06/28/2022  OCT, Retina - OU - Both Eyes       Right Eye Quality was good. Central  Foveal Thickness: 287. Progression has worsened. Findings include normal foveal contour, no SRF, retinal drusen , intraretinal fluid, pigment epithelial detachment (Mild Interval increase in peripapillary IRF/cystic changes -- migrating towards fovea).   Left Eye Quality was borderline. Central Foveal Thickness: 269. Progression has been stable. Findings include normal foveal contour, no SRF, retinal drusen , intraretinal fluid (Persistent peripapillary IRF/cystic changes -- ?mild improvement).   Notes *Images captured and stored on drive  Diagnosis / Impression:  OD Mild Interval increase in peripapillary IRF/cystic changes  OS Persistent peripapillary IRF/cystic changes -- ?mild improvement  Clinical management:  See below  Abbreviations: NFP - Normal foveal profile. CME - cystoid macular edema. PED - pigment epithelial detachment. IRF - intraretinal fluid. SRF - subretinal fluid. EZ - ellipsoid zone. ERM - epiretinal membrane. ORA - outer retinal atrophy. ORT - outer retinal tubulation. SRHM - subretinal hyper-reflective material. IRHM - intraretinal hyper-reflective material      Intravitreal Injection, Pharmacologic Agent - OD - Right Eye       Time Out 06/28/2022. 10:04 AM. Confirmed correct patient, procedure, site, and patient consented.   Anesthesia Topical anesthesia was used. Anesthetic medications included Lidocaine 2%, Proparacaine 0.5%.   Procedure Preparation included 5% betadine to ocular surface, eyelid speculum. A (32g) needle was used.   Injection: 2 mg aflibercept 2 MG/0.05ML   Route: Intravitreal, Site: Right Eye   NDC: A3590391, Lot: [id number], Expiration date: 04/12/2023, Waste: 0 mL   Post-op Post injection exam found visual acuity of at least counting fingers. The patient tolerated the procedure well. There were no complications. The patient received written and verbal post procedure care education.      Intravitreal Injection, Pharmacologic Agent  - OS - Left Eye       Time Out 06/28/2022. 10:04 AM. Confirmed correct patient, procedure, site, and patient consented.   Anesthesia Topical anesthesia was used. Anesthetic medications included Lidocaine 2%, Proparacaine 0.5%.   Procedure Preparation included 5% betadine to ocular surface, eyelid speculum. A (32g) needle was used.   Injection: 2 mg aflibercept 2 MG/0.05ML   Route: Intravitreal, Site: Left Eye   NDC: M7179715, Lot: [id number], Expiration date: 01/12/2022, Waste: 0 mL   Post-op Post injection exam found visual acuity of at least counting fingers. The patient tolerated the procedure well. There were no complications. The patient received written and verbal post procedure care education. Post injection medications were not given.            ASSESSMENT/PLAN:    ICD-10-CM   1. 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 Essential hypertension  I10     3. Hypertensive retinopathy of both eyes  H35.033     4. Pseudophakia, both eyes  Z96.1      1. Peripapillary CNV / ex ARMD OU  - 8.1.22 pt with 1 wk history of decreased vision OS and isolated light green flashes OD -- flashes OD resolved, blurriness OS improving  - 10.04.22 small recurrent episode of flashes OD last week.   - 11.15.22 subjectively blurred vision OS             - s/p IVA OS #1 (11.15.22), #2 (12.13.22), #3 (  01.10.23), #4 (02.09.23) -- IVA resistance  - s/p IVE OS #1 (04.18.23), #2 (05.16.23), #3 (06.13.23) - BCVA 20/20 OU - stable  - exam and OCT shows OD Mild Interval increase in peripapillary IRF/cystic changes; OS Persistent peripapillary IRF/cystic changes -- ?mild improvement  - FA (08.23.22)  shows mild peripapillary staining and leakage OU -- +CNV  - unclear etiology, but suspect ARMD vs idiopathic CNV; post-COVID vaccine? -- pt received 2nd booster end of Sept 2022  - recommend IVE OU (OD #1 and OS #4) today, 07.17.23  - pt wishes to proceed with injection  - RBA of procedure discussed, questions answered - IVA informed consent obtained and signed, 11.15.22 (OS) - IVE informed consent obtained and signed, 07.17.23 (OU) - see procedure note  - Amsler grid given to closely monitor for vision changes  - f/u 4 weeks, DFE, OCT, possible injection  2,3. Hypertensive retinopathy OU - discussed importance of tight BP control - monitor  4. Pseudophakia OU  - s/p CE/IOL (Dr. Talbert Forest, 2022)  - IOLs in good position, doing well - monitor  Ophthalmic Meds Ordered this visit:  Meds ordered this encounter  Medications   aflibercept (EYLEA) SOLN 2 mg   aflibercept (EYLEA) SOLN 2 mg     Return in about 4 weeks (around 07/26/2022) for f/u exu ARMD OU, DFE, OCT.  There are no Patient Instructions on file for this visit.  Explained the diagnoses, plan, and follow up with the patient and they expressed understanding.  Patient expressed understanding of the importance of proper follow up care.    This document serves as a record of services personally performed by Gardiner Sleeper, MD, PhD. It was created on their behalf by Roselee Nova, COMT. The creation of this record is the provider's dictation and/or activities during the visit.  Electronically signed by: Roselee Nova, COMT 06/28/22 12:20 PM  This document serves as a record of services personally performed by Gardiner Sleeper, MD, PhD. It was created on their behalf by San Jetty. Owens Shark, OA an ophthalmic technician. The creation of this record is the provider's dictation and/or activities during the visit.    Electronically signed by: San Jetty. Owens Shark, [place] 07.17.2023 12:20 PM   Gardiner Sleeper, M.D., Ph.D. Diseases & Surgery of the  Retina and Vitreous [facility]  I have reviewed the above documentation for accuracy and completeness, and I agree with the above. Gardiner Sleeper, M.D., Ph.D. 06/28/22 12:25 PM  Abbreviations: M myopia (nearsighted); A astigmatism; H hyperopia (farsighted); P presbyopia; Mrx spectacle prescription;  CTL contact lenses; OD right eye; OS left eye; OU both eyes  XT exotropia; ET esotropia; PEK punctate epithelial keratitis; PEE punctate epithelial erosions; DES dry eye syndrome; MGD meibomian gland dysfunction; ATs artificial tears; PFAT's preservative free artificial tears; [place] nuclear sclerotic cataract; PSC posterior subcapsular cataract; ERM epi-retinal membrane; PVD posterior vitreous detachment; RD retinal detachment; DM diabetes mellitus; DR diabetic retinopathy; NPDR non-proliferative diabetic retinopathy; PDR proliferative diabetic retinopathy; CSME clinically significant macular edema; DME diabetic macular edema; dbh dot blot hemorrhages; CWS cotton wool spot; POAG primary open angle glaucoma; C/D cup-to-disc ratio; HVF humphrey visual field; GVF goldmann visual field; OCT optical coherence tomography; IOP intraocular pressure; BRVO Branch retinal vein occlusion; CRVO central retinal vein occlusion; CRAO central retinal artery occlusion; BRAO branch retinal artery occlusion; RT retinal tear; SB scleral buckle; PPV pars plana vitrectomy; VH Vitreous hemorrhage; PRP panretinal laser photocoagulation; IVK intravitreal kenalog; VMT vitreomacular traction; MH Macular  hole;  NVD neovascularization of the disc; NVE neovascularization elsewhere; AREDS age related eye disease study; ARMD age related macular degeneration; POAG primary open angle glaucoma; EBMD epithelial/anterior basement membrane dystrophy; ACIOL anterior chamber intraocular lens; IOL intraocular lens; PCIOL posterior chamber intraocular lens; Phaco/IOL phacoemulsification with intraocular lens placement; [place]  photorefractive keratectomy; LASIK laser assisted in situ keratomileusis; HTN hypertension; DM diabetes mellitus; COPD chronic obstructive pulmonary disease

## 2022-06-25 ENCOUNTER — Encounter (INDEPENDENT_AMBULATORY_CARE_PROVIDER_SITE_OTHER): Payer: PPO | Admitting: Ophthalmology

## 2022-06-28 ENCOUNTER — Ambulatory Visit (INDEPENDENT_AMBULATORY_CARE_PROVIDER_SITE_OTHER): Payer: PPO | Admitting: Ophthalmology

## 2022-06-28 ENCOUNTER — Encounter (INDEPENDENT_AMBULATORY_CARE_PROVIDER_SITE_OTHER): Payer: Self-pay | Admitting: Ophthalmology

## 2022-06-28 DIAGNOSIS — H35033 Hypertensive retinopathy, bilateral: Secondary | ICD-10-CM

## 2022-06-28 DIAGNOSIS — H353231 Exudative age-related macular degeneration, bilateral, with active choroidal neovascularization: Secondary | ICD-10-CM

## 2022-06-28 DIAGNOSIS — Z961 Presence of intraocular lens: Secondary | ICD-10-CM

## 2022-06-28 DIAGNOSIS — I10 Essential (primary) hypertension: Secondary | ICD-10-CM | POA: Diagnosis not present

## 2022-06-28 MED ORDER — AFLIBERCEPT 2MG/0.05ML IZ SOLN FOR KALEIDOSCOPE
2.0000 mg | INTRAVITREAL | Status: AC | PRN
  Administered 2022-06-28: 2 mg via INTRAVITREAL

## 2022-07-13 DIAGNOSIS — M5431 Sciatica, right side: Secondary | ICD-10-CM | POA: Diagnosis not present

## 2022-07-21 DIAGNOSIS — M5416 Radiculopathy, lumbar region: Secondary | ICD-10-CM | POA: Diagnosis not present

## 2022-07-26 ENCOUNTER — Encounter (INDEPENDENT_AMBULATORY_CARE_PROVIDER_SITE_OTHER): Payer: PPO | Admitting: Ophthalmology

## 2022-07-26 DIAGNOSIS — I10 Essential (primary) hypertension: Secondary | ICD-10-CM

## 2022-07-26 DIAGNOSIS — H35033 Hypertensive retinopathy, bilateral: Secondary | ICD-10-CM

## 2022-07-26 DIAGNOSIS — H353231 Exudative age-related macular degeneration, bilateral, with active choroidal neovascularization: Secondary | ICD-10-CM

## 2022-07-26 DIAGNOSIS — Z961 Presence of intraocular lens: Secondary | ICD-10-CM

## 2022-07-27 ENCOUNTER — Encounter (INDEPENDENT_AMBULATORY_CARE_PROVIDER_SITE_OTHER): Payer: Self-pay | Admitting: Ophthalmology

## 2022-07-27 ENCOUNTER — Ambulatory Visit (INDEPENDENT_AMBULATORY_CARE_PROVIDER_SITE_OTHER): Payer: PPO | Admitting: Ophthalmology

## 2022-07-27 DIAGNOSIS — H353231 Exudative age-related macular degeneration, bilateral, with active choroidal neovascularization: Secondary | ICD-10-CM

## 2022-07-27 DIAGNOSIS — H35033 Hypertensive retinopathy, bilateral: Secondary | ICD-10-CM

## 2022-07-27 DIAGNOSIS — Z961 Presence of intraocular lens: Secondary | ICD-10-CM

## 2022-07-27 DIAGNOSIS — I10 Essential (primary) hypertension: Secondary | ICD-10-CM | POA: Diagnosis not present

## 2022-07-27 MED ORDER — AFLIBERCEPT 2MG/0.05ML IZ SOLN FOR KALEIDOSCOPE
2.0000 mg | INTRAVITREAL | Status: AC | PRN
  Administered 2022-07-27: 2 mg via INTRAVITREAL

## 2022-07-27 NOTE — Progress Notes (Addendum)
[facility] Note  07/27/2022    CHIEF COMPLAINT Patient presents for Retina Follow Up  HISTORY OF PRESENT ILLNESS: Raymond Alexander is a 76 y.o. male who presents to the clinic today for:   HPI     Retina Follow Up   Patient presents with  Wet AMD.  In both eyes.  Severity is moderate.  Duration of 4 weeks.  Since onset it is stable.  I, the attending physician,  performed the HPI with the patient and updated documentation appropriately.        Comments   Pt here for 4 wk ret f/u for exu ARMD OU. Pt states VA is about the same. Still seeing green flashes at the top of VA from time to time. Pt is beginning cortisone shots in his back beginning next week.       Last edited by Bernarda Caffey, MD on 07/27/2022 12:05 PM.    Pt feels like vision is okay, he is seeing green fol, pt has pain in his L3-4 vertebrae and is schedule to get cortisone shots next week, he received a round of prednisone  Referring physician: Lujean Amel, MD [address],  [place] [address]  HISTORICAL INFORMATION:   Selected notes from the [facility] Referred by Dr. Nicki Reaper for concern of retinal edema OU   CURRENT MEDICATIONS: No current outpatient medications on file. (Ophthalmic Drugs)   No current facility-administered medications for this visit. (Ophthalmic Drugs)   Current Outpatient Medications (Other)  Medication Sig   aspirin 81 MG tablet Take 81 mg by mouth daily.   atorvastatin (LIPITOR) 80 MG tablet Take 80 mg by mouth daily.   Coenzyme Q10 (CO Q-10) 100 MG CAPS Take 300 mg by mouth daily.   ezetimibe (ZETIA) 10 MG tablet Take 10 mg by mouth daily.   fexofenadine (ALLEGRA) 180 MG tablet Take 180 mg by mouth daily as needed for allergies.    fluticasone (FLONASE) 50 MCG/ACT nasal spray Place 1 spray into both nostrils daily.   IBUPROFEN & ACETAMINOPHEN PO Take by mouth as needed.   lisinopril (PRINIVIL,ZESTRIL) 10 MG tablet Take  10 mg by mouth daily.   Mag Aspart-Potassium Aspart (POTASSIUM & MAGNESIUM ASPARTAT) 250-250 MG CAPS Take 1 tablet by mouth daily.   Multiple Vitamin (MULTIVITAMIN) tablet Take 1 tablet by mouth daily.   nitroGLYCERIN (NITROSTAT) 0.4 MG SL tablet Place 0.4 mg under the tongue every 5 (five) minutes as needed for chest pain.   Omega-3 Fatty Acids (FISH OIL) 1000 MG CAPS Take 2 capsules by mouth daily.   OVER THE COUNTER MEDICATION Take 1 tablet by mouth daily. Med Name: PROTANDIM   sildenafil (VIAGRA) 50 MG tablet Take 50 mg by mouth daily as needed for erectile dysfunction.   No current facility-administered medications for this visit. (Other)   REVIEW OF SYSTEMS: ROS   Positive for: Cardiovascular, Eyes Negative for: Constitutional, Gastrointestinal, Neurological, Skin, Genitourinary, Musculoskeletal, HENT, Endocrine, Respiratory, Psychiatric, Allergic/Imm, Heme/Lymph Last edited by Kingsley Spittle, COT on 07/27/2022  8:33 AM.     ALLERGIES No Known Allergies  PAST MEDICAL HISTORY Past Medical History:  Diagnosis Date   ASCVD (arteriosclerotic cardiovascular disease)    3 vessel   Clotting disorder ([place])    Esophageal reflux    Hyperlipidemia    Hypertension    Macular degeneration    Past Surgical History:  Procedure Laterality Date   CATARACT EXTRACTION  PTCA  10/14/2003   mid RCA   FAMILY HISTORY Family History  Problem Relation Age of Onset   Heart attack Father    Cancer Mother    Diabetes Sister    Cancer Brother    Healthy Brother    SOCIAL HISTORY Social History   Tobacco Use   Smoking status: Former   Smokeless tobacco: Never  Scientific laboratory technician Use: Never used  Substance Use Topics   Alcohol use: Yes    Alcohol/week: 1.0 standard drink of alcohol    Types: 1 Glasses of wine per week    Comment: 1-2 per day   Drug use: No       OPHTHALMIC EXAM:  Base Eye Exam     Visual Acuity (Snellen - Linear)       Right Left   Dist cc 20/25 -1  20/20 -1   Dist ph cc 20/20 -1          Tonometry (Tonopen, 8:39 AM)       Right Left   Pressure 10 10         Pupils       Dark Light Shape React APD   Right 4 3 Round Brisk None   Left 4 3 Round Brisk None         Visual Fields (Counting fingers)       Left Right    Full Full         Extraocular Movement       Right Left    Full, Ortho Full, Ortho         Neuro/Psych     Oriented x3: Yes   Mood/Affect: Normal         Dilation     Both eyes: 1.0% Mydriacyl, 2.5% Phenylephrine @ 8:40 AM           Slit Lamp and Fundus Exam     Slit Lamp Exam       Right Left   Lids/Lashes Normal small stye nasal LL margin   Conjunctiva/Sclera White and quiet White and quiet   Cornea arcus, well healed cataract wound, trace PEE arcus, well healed cataract wound, trace PEE   Anterior Chamber Deep and quiet Deep and quiet   Iris Round and dilated Round and dilated   Lens PC IOL in good position Toric PC IOL in good position with marks at 0530 and 1100   Anterior Vitreous Mild Vitreous syneresis, no cell or pigment, low lying Posterior vitreous detachment, Weiss ring Mild Vitreous syneresis, no cell or pigment, Posterior vitreous detachment         Fundus Exam       Right Left   Disc Pink and Sharp, mild peripapillary edema - slightly increased ?peripapillary CNV ST to disc -- persistent, no heme Pink and Sharp, peripapillary edema -- slightly increased, CNV ST disc, no heme   C/D Ratio 0.5 0.4   Macula Good foveal reflex, nasal cystic changes/edema - slightly increased, fine drusen v exudate - improved, no heme Good foveal reflex, mild edema nasal macula -- slightly increased, no heme   Vessels attenuated, mild tortuosity, mild copper wiring mild attenuation, mild Copper wiring, mild AV crossing changes   Periphery Attached, No heme, no RT/RD Attached, No heme            Refraction     Wearing Rx       Sphere Cylinder Axis Add   Right [place] +0.25  060 +2.25  Left +0.25 Sphere  +2.25           IMAGING AND PROCEDURES  Imaging and Procedures for 07/27/2022  OCT, Retina - OU - Both Eyes       Right Eye Quality was good. Central Foveal Thickness: 292. Progression has worsened. Findings include normal foveal contour, retinal drusen , intraretinal fluid, pigment epithelial detachment, subretinal fluid (Interval increase in IRF/cystic changes nasal macula, focal SRF superior to disc).   Left Eye Quality was borderline. Central Foveal Thickness: 270. Progression has worsened. Findings include normal foveal contour, no SRF, retinal drusen , intraretinal fluid (Interval increase in peripapillary IRF nasal macula, +shallow SRF).   Notes *Images captured and stored on drive  Diagnosis / Impression:  OD Interval increase in IRF/cystic changes nasal macula, focal SRF superior to disc OS Interval increase in peripapillary IRF nasal macula, +shallow SRF  Clinical management:  See below  Abbreviations: NFP - Normal foveal profile. CME - cystoid macular edema. PED - pigment epithelial detachment. IRF - intraretinal fluid. SRF - subretinal fluid. EZ - ellipsoid zone. ERM - epiretinal membrane. ORA - outer retinal atrophy. ORT - outer retinal tubulation. SRHM - subretinal hyper-reflective material. IRHM - intraretinal hyper-reflective material      Fluorescein Angiography Optos (Transit OD)       Right Eye Progression has been stable. Early phase findings include staining. Mid/Late phase findings include staining (Mild peripapillary staining, ?leakage).   Left Eye Progression has been stable. Early phase findings include staining. Mid/Late phase findings include leakage, staining (Mild peripapillary staining and ?leakage).   Notes **Images stored on drive**  Impression: Mild peripapillary staining and ?leakage OU     Intravitreal Injection, Pharmacologic Agent - OD - Right Eye       Time Out 07/27/2022. 9:08 AM. Confirmed  correct patient, procedure, site, and patient consented.   Anesthesia Topical anesthesia was used. Anesthetic medications included Lidocaine 2%, Proparacaine 0.5%.   Procedure Preparation included 5% betadine to ocular surface, eyelid speculum. A (32g) needle was used.   Injection: 2 mg aflibercept 2 MG/0.05ML   Route: Intravitreal, Site: Right Eye   NDC: A3590391, Lot: [id number], Expiration date: 04/06/2023, Waste: 0 mL   Post-op Post injection exam found visual acuity of at least counting fingers. The patient tolerated the procedure well. There were no complications. The patient received written and verbal post procedure care education.      Intravitreal Injection, Pharmacologic Agent - OS - Left Eye       Time Out 07/27/2022. 9:09 AM. Confirmed correct patient, procedure, site, and patient consented.   Anesthesia Topical anesthesia was used. Anesthetic medications included Lidocaine 2%, Proparacaine 0.5%.   Procedure Preparation included 5% betadine to ocular surface, eyelid speculum. A (32g) needle was used.   Injection: 2 mg aflibercept 2 MG/0.05ML   Route: Intravitreal, Site: Left Eye   NDC: M7179715, Lot: [id number], Expiration date: 04/12/2023, Waste: 0 mL   Post-op Post injection exam found visual acuity of at least counting fingers. The patient tolerated the procedure well. There were no complications. The patient received written and verbal post procedure care education. Post injection medications were not given.            ASSESSMENT/PLAN:    ICD-10-CM   1. Exudative age-related macular degeneration of both eyes with active choroidal neovascularization (HCC)  H35.3231 OCT, Retina - OU - Both Eyes    Fluorescein Angiography Optos (Transit OD)    Intravitreal Injection, Pharmacologic Agent - OD - Right Eye  Intravitreal Injection, Pharmacologic Agent - OS - Left Eye    aflibercept (EYLEA) SOLN 2 mg    aflibercept (EYLEA) SOLN 2 mg    2. Essential  hypertension  I10     3. Hypertensive retinopathy of both eyes  H35.033 Fluorescein Angiography Optos (Transit OD)    4. Pseudophakia, both eyes  Z96.1      1. Peripapillary CNV / ex ARMD OU  - 8.1.22 pt with 1 wk history of decreased vision OS and isolated light green flashes OD -- flashes OD resolved, blurriness OS improving  - 10.04.22 small recurrent episode of flashes OD last week.   - 11.15.22 subjectively blurred vision OS             - s/p IVA OS #1 (11.15.22), #2 (12.13.22), #3 (01.10.23), #4 (02.09.23) -- IVA resistance  - s/p IVE OS #1 (04.18.23), #2 (05.16.23), #3 (06.13.23), #4 (07.17.23)  - s/p IVE OD #1 (07.17.23) - BCVA 20/20 OU - stable  - exam and OCT shows OD Interval increase in IRF/cystic changes nasal macula, focal SRF superior to disc; OS Interval increase in peripapillary IRF nasal macula, +shallow SRF  - pt reports recent aggravation of lower back and just completed pulse of po steroids  - repeat FA (08.23.22) shows mild peripapillary staining and ?leakage OU -- ?CNV  - unclear etiology, but suspect ARMD vs idiopathic CNV; post-COVID vaccine? -- pt received 2nd booster end of Sept 2022  - recommend IVE OU (OD #2 and OS #5) today, 08.15.23  - pt wishes to proceed with injection  - RBA of procedure discussed, questions answered - IVA informed consent obtained and signed, 11.15.22 (OS) - IVE informed consent obtained and signed, 07.17.23 (OU) - see procedure note  - Amsler grid given to closely monitor for vision changes  - f/u 4 weeks, DFE, OCT, possible injection  2,3. Hypertensive retinopathy OU - discussed importance of tight BP control - monitor  4. Pseudophakia OU  - s/p CE/IOL (Dr. Talbert Forest, 2022)  - IOLs in good position, doing well - monitor  Ophthalmic Meds Ordered this visit:  Meds ordered this encounter  Medications   aflibercept (EYLEA) SOLN 2 mg   aflibercept (EYLEA) SOLN 2 mg     Return in about 4 weeks (around 08/24/2022) for ex ARMD OU,  Dilated Exam, OCT, Possible Injxn.  There are no Patient Instructions on file for this visit.  Explained the diagnoses, plan, and follow up with the patient and they expressed understanding.  Patient expressed understanding of the importance of proper follow up care.    This document serves as a record of services personally performed by Gardiner Sleeper, MD, PhD. It was created on their behalf by San Jetty. Owens Shark, OA an ophthalmic technician. The creation of this record is the provider's dictation and/or activities during the visit.    Electronically signed by: San Jetty. Marguerita Merles 08.15.2023 12:46 PM  Gardiner Sleeper, M.D., Ph.D. Diseases & Surgery of the Retina and Vitreous [facility]  I have reviewed the above documentation for accuracy and completeness, and I agree with the above. Gardiner Sleeper, M.D., Ph.D. 07/27/22 12:46 PM  Abbreviations: M myopia (nearsighted); A astigmatism; H hyperopia (farsighted); P presbyopia; Mrx spectacle prescription;  CTL contact lenses; OD right eye; OS left eye; OU both eyes  XT exotropia; ET esotropia; PEK punctate epithelial keratitis; PEE punctate epithelial erosions; DES dry eye syndrome; MGD meibomian gland dysfunction; ATs artificial tears; PFAT's preservative free  artificial tears; [place] nuclear sclerotic cataract; PSC posterior subcapsular cataract; ERM epi-retinal membrane; PVD posterior vitreous detachment; RD retinal detachment; DM diabetes mellitus; DR diabetic retinopathy; NPDR non-proliferative diabetic retinopathy; PDR proliferative diabetic retinopathy; CSME clinically significant macular edema; DME diabetic macular edema; dbh dot blot hemorrhages; CWS cotton wool spot; POAG primary open angle glaucoma; C/D cup-to-disc ratio; HVF humphrey visual field; GVF goldmann visual field; OCT optical coherence tomography; IOP intraocular pressure; BRVO Branch retinal vein occlusion; CRVO central retinal vein occlusion; CRAO central  retinal artery occlusion; BRAO branch retinal artery occlusion; RT retinal tear; SB scleral buckle; PPV pars plana vitrectomy; VH Vitreous hemorrhage; PRP panretinal laser photocoagulation; IVK intravitreal kenalog; VMT vitreomacular traction; MH Macular hole;  NVD neovascularization of the disc; NVE neovascularization elsewhere; AREDS age related eye disease study; ARMD age related macular degeneration; POAG primary open angle glaucoma; EBMD epithelial/anterior basement membrane dystrophy; ACIOL anterior chamber intraocular lens; IOL intraocular lens; PCIOL posterior chamber intraocular lens; Phaco/IOL phacoemulsification with intraocular lens placement; [place] photorefractive keratectomy; LASIK laser assisted in situ keratomileusis; HTN hypertension; DM diabetes mellitus; COPD chronic obstructive pulmonary disease

## 2022-08-03 DIAGNOSIS — M5416 Radiculopathy, lumbar region: Secondary | ICD-10-CM | POA: Diagnosis not present

## 2022-08-17 DIAGNOSIS — H00022 Hordeolum internum right lower eyelid: Secondary | ICD-10-CM | POA: Diagnosis not present

## 2022-08-19 NOTE — Progress Notes (Shared)
[facility] Note  08/25/2022    CHIEF COMPLAINT Patient presents for Retina Follow Up  HISTORY OF PRESENT ILLNESS: Raymond Alexander is a 76 y.o. male who presents to the clinic today for:   HPI     Retina Follow Up   Patient presents with  Wet AMD.  In both eyes.  Severity is moderate.  Duration of 4 weeks.  Since onset it is stable.  I, the attending physician,  performed the HPI with the patient and updated documentation appropriately.        Comments   Pt here for 4 wk ret f/u exu ARMD OU. Pt states VA is the same. Pt does reports 3 weeks ago receiving spinal IV cortisone injections. Pt has not noticed any change in his vision. Yesterday he finished taking an oral antibiotic for a sty in OD.       Last edited by Bernarda Caffey, MD on 08/25/2022 12:40 PM.    Pt states no change in vision, pt had cortisone injections in his spine 3 weeks ago  Referring physician: Lujean Amel, MD [address],  [place] [address]  HISTORICAL INFORMATION:   Selected notes from the MEDICAL RECORD NUMBER Referred by Dr. Nicki Reaper for concern of retinal edema OU   CURRENT MEDICATIONS: No current outpatient medications on file. (Ophthalmic Drugs)   No current facility-administered medications for this visit. (Ophthalmic Drugs)   Current Outpatient Medications (Other)  Medication Sig   aspirin 81 MG tablet Take 81 mg by mouth daily.   atorvastatin (LIPITOR) 80 MG tablet Take 80 mg by mouth daily.   Coenzyme Q10 (CO Q-10) 100 MG CAPS Take 300 mg by mouth daily.   ezetimibe (ZETIA) 10 MG tablet Take 10 mg by mouth daily.   fexofenadine (ALLEGRA) 180 MG tablet Take 180 mg by mouth daily as needed for allergies.    fluticasone (FLONASE) 50 MCG/ACT nasal spray Place 1 spray into both nostrils daily.   IBUPROFEN & ACETAMINOPHEN PO Take by mouth as needed.   lisinopril (PRINIVIL,ZESTRIL) 10 MG tablet Take 10 mg by mouth daily.   Mag Aspart-Potassium  Aspart (POTASSIUM & MAGNESIUM ASPARTAT) 250-250 MG CAPS Take 1 tablet by mouth daily.   Multiple Vitamin (MULTIVITAMIN) tablet Take 1 tablet by mouth daily.   nitroGLYCERIN (NITROSTAT) 0.4 MG SL tablet Place 0.4 mg under the tongue every 5 (five) minutes as needed for chest pain.   Omega-3 Fatty Acids (FISH OIL) 1000 MG CAPS Take 2 capsules by mouth daily.   OVER THE COUNTER MEDICATION Take 1 tablet by mouth daily. Med Name: PROTANDIM   sildenafil (VIAGRA) 50 MG tablet Take 50 mg by mouth daily as needed for erectile dysfunction.   No current facility-administered medications for this visit. (Other)   REVIEW OF SYSTEMS: ROS   Positive for: Cardiovascular, Eyes Negative for: Constitutional, Gastrointestinal, Neurological, Skin, Genitourinary, Musculoskeletal, HENT, Endocrine, Respiratory, Psychiatric, Allergic/Imm, Heme/Lymph Last edited by Kingsley Spittle, COT on 08/25/2022  8:06 AM.     ALLERGIES No Known Allergies  PAST MEDICAL HISTORY Past Medical History:  Diagnosis Date   ASCVD (arteriosclerotic cardiovascular disease)    3 vessel   Clotting disorder ([place])    Esophageal reflux    Hyperlipidemia    Hypertension    Macular degeneration    Past Surgical History:  Procedure Laterality Date   CATARACT EXTRACTION     PTCA  10/14/2003   mid RCA   FAMILY HISTORY  Family History  Problem Relation Age of Onset   Heart attack Father    Cancer Mother    Diabetes Sister    Cancer Brother    Healthy Brother    SOCIAL HISTORY Social History   Tobacco Use   Smoking status: Former   Smokeless tobacco: Never  Scientific laboratory technician Use: Never used  Substance Use Topics   Alcohol use: Yes    Alcohol/week: 1.0 standard drink of alcohol    Types: 1 Glasses of wine per week    Comment: 1-2 per day   Drug use: No       OPHTHALMIC EXAM:  Base Eye Exam     Visual Acuity (Snellen - Linear)       Right Left   Dist cc 20/20 20/20    Correction: Glasses          Tonometry (Tonopen, 8:11 AM)       Right Left   Pressure 9 10         Pupils       Pupils Dark Light Shape React APD   Right PERRL 4 3 Round Brisk None   Left PERRL 4 3 Round Brisk None         Visual Fields (Counting fingers)       Left Right    Full Full         Extraocular Movement       Right Left    Full, Ortho Full, Ortho         Neuro/Psych     Oriented x3: Yes   Mood/Affect: Normal         Dilation     Both eyes: 1.0% Mydriacyl, 2.5% Phenylephrine @ 8:12 AM           Slit Lamp and Fundus Exam     Slit Lamp Exam       Right Left   Lids/Lashes Normal small stye nasal LL margin   Conjunctiva/Sclera White and quiet White and quiet   Cornea arcus, well healed cataract wound, trace PEE arcus, well healed cataract wound, trace PEE   Anterior Chamber Deep and quiet Deep and quiet   Iris Round and dilated Round and dilated   Lens PC IOL in good position Toric PC IOL in good position with marks at 0530 and 1100   Anterior Vitreous Mild Vitreous syneresis, no cell or pigment, low lying Posterior vitreous detachment, Weiss ring Mild Vitreous syneresis, no cell or pigment, Posterior vitreous detachment         Fundus Exam       Right Left   Disc Pink and Sharp, mild peripapillary edema - improved ?peripapillary CNV ST to disc -- persistent, no heme Pink and Sharp, peripapillary edema -- improved, CNV ST disc, no heme   C/D Ratio 0.5 0.4   Macula Good foveal reflex, nasal cystic changes/edema - improved, fine drusen v exudate - improved, no heme Good foveal reflex, mild edema nasal macula -- improved, no heme   Vessels attenuated, mild tortuosity, mild copper wiring mild attenuation, mild Copper wiring, mild AV crossing changes   Periphery Attached, No heme, no RT/RD Attached, No heme            Refraction     Wearing Rx       Sphere Cylinder Axis Add   Right [place] +0.25 060 +2.25   Left +0.25 Sphere  +2.25           IMAGING  AND  PROCEDURES  Imaging and Procedures for 08/25/2022  OCT, Retina - OU - Both Eyes       Right Eye Quality was good. Central Foveal Thickness: 287. Progression has improved. Findings include normal foveal contour, retinal drusen , intraretinal fluid, pigment epithelial detachment, subretinal fluid (Interval improvement in IRF/cystic changes nasal macula, interval improvement in focal SRF superior to disc).   Left Eye Quality was good. Central Foveal Thickness: 271. Progression has improved. Findings include normal foveal contour, no SRF, retinal drusen , intraretinal fluid (Interval improvement in peripapillary IRF/SRF nasal macula, +shallow SRF -- resolved).   Notes *Images captured and stored on drive  Diagnosis / Impression:  OD Interval improvement in IRF/cystic changes nasal macula, interval improvement in focal SRF superior to disc OS Interval improvement in peripapillary IRF/SRF nasal macula, +shallow SRF -- resolved  Clinical management:  See below  Abbreviations: NFP - Normal foveal profile. CME - cystoid macular edema. PED - pigment epithelial detachment. IRF - intraretinal fluid. SRF - subretinal fluid. EZ - ellipsoid zone. ERM - epiretinal membrane. ORA - outer retinal atrophy. ORT - outer retinal tubulation. SRHM - subretinal hyper-reflective material. IRHM - intraretinal hyper-reflective material      Intravitreal Injection, Pharmacologic Agent - OD - Right Eye       Time Out 08/25/2022. 8:48 AM. Confirmed correct patient, procedure, site, and patient consented.   Anesthesia Topical anesthesia was used. Anesthetic medications included Lidocaine 2%, Proparacaine 0.5%.   Procedure Preparation included 5% betadine to ocular surface, eyelid speculum. A (32g) needle was used.   Injection: 2 mg aflibercept 2 MG/0.05ML   Route: Intravitreal, Site: Right Eye   NDC: A3590391, Lot: [id number], Expiration date: 09/12/2023, Waste: 0 mL   Post-op Post injection exam found  visual acuity of at least counting fingers. The patient tolerated the procedure well. There were no complications. The patient received written and verbal post procedure care education.      Intravitreal Injection, Pharmacologic Agent - OS - Left Eye       Time Out 08/25/2022. 8:48 AM. Confirmed correct patient, procedure, site, and patient consented.   Anesthesia Topical anesthesia was used. Anesthetic medications included Lidocaine 2%, Proparacaine 0.5%.   Procedure Preparation included 5% betadine to ocular surface, eyelid speculum. A (32g) needle was used.   Injection: 2 mg aflibercept 2 MG/0.05ML   Route: Intravitreal, Site: Left Eye   NDC: A3590391, Lot: [id number], Expiration date: 10/05/2022, Waste: 0 mL   Post-op Post injection exam found visual acuity of at least counting fingers. The patient tolerated the procedure well. There were no complications. The patient received written and verbal post procedure care education. Post injection medications were not given.            ASSESSMENT/PLAN:    ICD-10-CM   1. 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 Essential hypertension  I10     3. Hypertensive retinopathy of both eyes  H35.033     4. Pseudophakia, both eyes  Z96.1      1. Peripapillary CNV / ex ARMD OU  - 8.1.22 pt with 1 wk history of decreased vision OS and isolated light green flashes OD --  flashes OD resolved, blurriness OS improving  - 10.04.22 small recurrent episode of flashes OD last week.   - 11.15.22 subjectively blurred vision OS             - s/p IVA OS #1 (11.15.22), #2 (12.13.22), #3 (01.10.23), #4 (02.09.23) -- IVA resistance  - s/p IVE OS #1  (04.18.23), #2 (05.16.23), #3 (06.13.23), #4 (07.17.23), #5 (08.15.23)  - s/p IVE OD #1 (07.17.23), #2 (08.15.23) - BCVA 20/20 OU - stable  - exam and OCT shows -- OD Interval improvement in IRF/cystic changes nasal macula, interval improvement in focal SRF superior to disc; OS Interval improvement in peripapillary IRF/SRF nasal macula, +shallow SRF -- resolved  - pt reports recent aggravation of lower back and received a steroid shot in lower back  - repeat FA (08.23.22) shows mild peripapillary staining and ?leakage OU -- ?CNV  - unclear etiology, but suspect ARMD vs idiopathic CNV; post-COVID vaccine? -- pt received 2nd booster end of Sept 2022  - recommend IVE OU (OD #3 and OS #6) today, 09.13.23  - pt wishes to proceed with injection  - RBA of procedure discussed, questions answered - IVA informed consent obtained and signed, 11.15.22 (OS) - IVE informed consent obtained and signed, 07.17.23 (OU) - see procedure note  - Amsler grid given to closely monitor for vision changes  - f/u 5 weeks, DFE, OCT, possible injection  2,3. Hypertensive retinopathy OU - discussed importance of tight BP control - monitor  4. Pseudophakia OU  - s/p CE/IOL (Dr. Talbert Forest, 2022)  - IOLs in good position, doing well - monitor  Ophthalmic Meds Ordered this visit:  Meds ordered this encounter  Medications   aflibercept (EYLEA) SOLN 2 mg   aflibercept (EYLEA) SOLN 2 mg     Return in about 5 weeks (around 09/29/2022) for f/u exu ARMD OU, DFE, OCT.  There are no Patient Instructions on file for this visit.  Explained the diagnoses, plan, and follow up with the patient and they expressed understanding.  Patient expressed understanding of the importance of proper follow up care.    This document serves as a record of services personally performed by Gardiner Sleeper, MD, PhD. It was created on their behalf by Orvan Falconer, an ophthalmic technician. The creation of this record is the provider's  dictation and/or activities during the visit.    Electronically signed by: Orvan Falconer, OA, 08/25/22  12:42 PM  This document serves as a record of services personally performed by Gardiner Sleeper, MD, PhD. It was created on their behalf by San Jetty. Owens Shark, OA an ophthalmic technician. The creation of this record is the provider's dictation and/or activities during the visit.    Electronically signed by: San Jetty. Marguerita Merles 09.13.2023 12:42 PM  Gardiner Sleeper, M.D., Ph.D. Diseases & Surgery of the Retina and Vitreous [facility]  I have reviewed the above documentation for accuracy and completeness, and I agree with the above. Gardiner Sleeper, M.D., Ph.D. 08/25/22 12:42 PM   Abbreviations: M myopia (nearsighted); A astigmatism; H hyperopia (farsighted); P presbyopia; Mrx spectacle prescription;  CTL contact lenses; OD right eye; OS left eye; OU both eyes  XT exotropia; ET esotropia; PEK punctate epithelial keratitis; PEE punctate epithelial erosions; DES dry eye syndrome; MGD meibomian gland dysfunction; ATs artificial tears; PFAT's preservative free artificial tears; [place] nuclear sclerotic cataract; PSC posterior subcapsular cataract; ERM epi-retinal membrane; PVD posterior vitreous detachment; RD retinal detachment; DM diabetes mellitus; DR  diabetic retinopathy; NPDR non-proliferative diabetic retinopathy; PDR proliferative diabetic retinopathy; CSME clinically significant macular edema; DME diabetic macular edema; dbh dot blot hemorrhages; CWS cotton wool spot; POAG primary open angle glaucoma; C/D cup-to-disc ratio; HVF humphrey visual field; GVF goldmann visual field; OCT optical coherence tomography; IOP intraocular pressure; BRVO Branch retinal vein occlusion; CRVO central retinal vein occlusion; CRAO central retinal artery occlusion; BRAO branch retinal artery occlusion; RT retinal tear; SB scleral buckle; PPV pars plana vitrectomy; VH Vitreous hemorrhage; PRP  panretinal laser photocoagulation; IVK intravitreal kenalog; VMT vitreomacular traction; MH Macular hole;  NVD neovascularization of the disc; NVE neovascularization elsewhere; AREDS age related eye disease study; ARMD age related macular degeneration; POAG primary open angle glaucoma; EBMD epithelial/anterior basement membrane dystrophy; ACIOL anterior chamber intraocular lens; IOL intraocular lens; PCIOL posterior chamber intraocular lens; Phaco/IOL phacoemulsification with intraocular lens placement; [place] photorefractive keratectomy; LASIK laser assisted in situ keratomileusis; HTN hypertension; DM diabetes mellitus; COPD chronic obstructive pulmonary disease

## 2022-08-25 ENCOUNTER — Encounter (INDEPENDENT_AMBULATORY_CARE_PROVIDER_SITE_OTHER): Payer: Self-pay | Admitting: Ophthalmology

## 2022-08-25 ENCOUNTER — Ambulatory Visit (INDEPENDENT_AMBULATORY_CARE_PROVIDER_SITE_OTHER): Payer: PPO | Admitting: Ophthalmology

## 2022-08-25 DIAGNOSIS — I10 Essential (primary) hypertension: Secondary | ICD-10-CM

## 2022-08-25 DIAGNOSIS — H353231 Exudative age-related macular degeneration, bilateral, with active choroidal neovascularization: Secondary | ICD-10-CM

## 2022-08-25 DIAGNOSIS — H539 Unspecified visual disturbance: Secondary | ICD-10-CM

## 2022-08-25 DIAGNOSIS — Z961 Presence of intraocular lens: Secondary | ICD-10-CM

## 2022-08-25 DIAGNOSIS — H35033 Hypertensive retinopathy, bilateral: Secondary | ICD-10-CM

## 2022-08-25 MED ORDER — AFLIBERCEPT 2MG/0.05ML IZ SOLN FOR KALEIDOSCOPE
2.0000 mg | INTRAVITREAL | Status: AC | PRN
  Administered 2022-08-25: 2 mg via INTRAVITREAL

## 2022-08-26 DIAGNOSIS — Z23 Encounter for immunization: Secondary | ICD-10-CM | POA: Diagnosis not present

## 2022-09-17 DIAGNOSIS — H0012 Chalazion right lower eyelid: Secondary | ICD-10-CM | POA: Diagnosis not present

## 2022-09-22 NOTE — Progress Notes (Addendum)
[facility] Note  09/29/2022    CHIEF COMPLAINT Patient presents for Retina Follow Up  HISTORY OF PRESENT ILLNESS: Raymond Alexander is a 76 y.o. male who presents to the clinic today for:   HPI     Retina Follow Up   Patient presents with  Wet AMD.  In both eyes.  Severity is moderate.  Duration of 5 weeks.  Since onset it is stable.  I, the attending physician,  performed the HPI with the patient and updated documentation appropriately.        Comments   Pt is here for 5 week retina follow up CNV ARMD OU NE OU Pt states no vision changes noticed he did have a flash in OD only once since his last appointment       Last edited by Bernarda Caffey, MD on 09/29/2022  8:46 AM.    Patient feels that the vision is improving since last visit.  Referring physician: Lujean Amel, MD [address] [place],  [place] [address]  HISTORICAL INFORMATION:   Selected notes from the MEDICAL RECORD NUMBER Referred by Dr. Nicki Reaper for concern of retinal edema OU   CURRENT MEDICATIONS: No current outpatient medications on file. (Ophthalmic Drugs)   No current facility-administered medications for this visit. (Ophthalmic Drugs)   Current Outpatient Medications (Other)  Medication Sig   aspirin 81 MG tablet Take 81 mg by mouth daily.   atorvastatin (LIPITOR) 80 MG tablet Take 80 mg by mouth daily.   Coenzyme Q10 (CO Q-10) 100 MG CAPS Take 300 mg by mouth daily.   ezetimibe (ZETIA) 10 MG tablet Take 10 mg by mouth daily.   fexofenadine (ALLEGRA) 180 MG tablet Take 180 mg by mouth daily as needed for allergies.    fluticasone (FLONASE) 50 MCG/ACT nasal spray Place 1 spray into both nostrils daily.   IBUPROFEN & ACETAMINOPHEN PO Take by mouth as needed.   lisinopril (PRINIVIL,ZESTRIL) 10 MG tablet Take 10 mg by mouth daily.   Mag Aspart-Potassium Aspart (POTASSIUM & MAGNESIUM ASPARTAT) 250-250 MG CAPS Take 1 tablet by mouth daily.   Multiple Vitamin  (MULTIVITAMIN) tablet Take 1 tablet by mouth daily.   nitroGLYCERIN (NITROSTAT) 0.4 MG SL tablet Place 0.4 mg under the tongue every 5 (five) minutes as needed for chest pain.   Omega-3 Fatty Acids (FISH OIL) 1000 MG CAPS Take 2 capsules by mouth daily.   OVER THE COUNTER MEDICATION Take 1 tablet by mouth daily. Med Name: PROTANDIM   sildenafil (VIAGRA) 50 MG tablet Take 50 mg by mouth daily as needed for erectile dysfunction.   No current facility-administered medications for this visit. (Other)   REVIEW OF SYSTEMS:   ALLERGIES No Known Allergies  PAST MEDICAL HISTORY Past Medical History:  Diagnosis Date   ASCVD (arteriosclerotic cardiovascular disease)    3 vessel   Clotting disorder (HCC)    Esophageal reflux    Hyperlipidemia    Hypertension    Macular degeneration    Past Surgical History:  Procedure Laterality Date   CATARACT EXTRACTION     PTCA  10/14/2003   mid RCA   FAMILY HISTORY Family History  Problem Relation Age of Onset   Heart attack Father    Cancer Mother    Diabetes Sister    Cancer Brother    Healthy Brother    SOCIAL HISTORY Social History   Tobacco Use   Smoking status: Former   Smokeless tobacco: Never  Vaping Use   Vaping Use: Never used  Substance Use Topics   Alcohol use: Yes    Alcohol/week: 1.0 standard drink of alcohol    Types: 1 Glasses of wine per week    Comment: 1-2 per day   Drug use: No       OPHTHALMIC EXAM:  Base Eye Exam     Visual Acuity (Snellen - Linear)       Right Left   Dist cc 20/20 -2 20/20 -1    Correction: Glasses         Tonometry (Tonopen, 7:57 AM)       Right Left   Pressure 11 9         Pupils       Dark Light Shape React APD   Right 3 2 Round Brisk None   Left 3 2 Round Brisk None         Visual Fields       Left Right    Full Full         Extraocular Movement       Right Left    Full Full         Neuro/Psych     Oriented x3: Yes   Mood/Affect: Normal          Dilation     Both eyes: 2.5% Phenylephrine, 1.0% Mydriacyl @ 7:57 AM           Slit Lamp and Fundus Exam     Slit Lamp Exam       Right Left   Lids/Lashes Normal small stye nasal LL margin   Conjunctiva/Sclera White and quiet White and quiet   Cornea arcus, well healed cataract wound, trace PEE arcus, well healed cataract wound, trace PEE   Anterior Chamber Deep and quiet Deep and quiet   Iris Round and dilated Round and dilated   Lens PC IOL in good position Toric PC IOL in good position with marks at 0530 and 1100   Anterior Vitreous Mild Vitreous syneresis, no cell or pigment, low lying Posterior vitreous detachment, Weiss ring Mild Vitreous syneresis, no cell or pigment, Posterior vitreous detachment         Fundus Exam       Right Left   Disc Pink and Sharp, mild peripapillary edema - improved ?peripapillary CNV ST to disc -- persistent, no heme Pink and Sharp, peripapillary edema -- improved, CNV ST disc, no heme   C/D Ratio 0.5 0.4   Macula Good foveal reflex, nasal cystic changes/edema - improved, fine drusen vs exudate - improved, no heme Good foveal reflex, mild edema nasal macula -- improved, no heme   Vessels attenuated, mild tortuosity, mild copper wiring mild attenuation, mild Copper wiring, mild AV crossing changes   Periphery Attached, No heme, no RT/RD Attached, No heme            Refraction     Wearing Rx       Sphere Cylinder Axis Add   Right [place] +0.25 060 +2.25   Left +0.25 Sphere  +2.25           IMAGING AND PROCEDURES  Imaging and Procedures for 09/29/2022  OCT, Retina - OU - Both Eyes       Right Eye Quality was good. Central Foveal Thickness: 288. Progression has improved. Findings include normal foveal contour, retinal drusen , intraretinal fluid, pigment epithelial detachment, subretinal fluid (Interval improvement in IRF/cystic changes nasal macula, interval improvement in focal  SRF superior to disc).   Left  Eye Quality was good. Central Foveal Thickness: 276. Progression has improved. Findings include normal foveal contour, no SRF, retinal drusen , intraretinal fluid (Interval improvement in peripapillary IRF/SRF nasal macula, +shallow SRF -- stably resolved).   Notes *Images captured and stored on drive  Diagnosis / Impression:  OD Interval improvement in IRF/cystic changes nasal macula, interval improvement in focal SRF superior to disc -- resolved OS Interval improvement in peripapillary IRF/SRF nasal macula; shallow SRF stably resolved  Clinical management:  See below  Abbreviations: NFP - Normal foveal profile. CME - cystoid macular edema. PED - pigment epithelial detachment. IRF - intraretinal fluid. SRF - subretinal fluid. EZ - ellipsoid zone. ERM - epiretinal membrane. ORA - outer retinal atrophy. ORT - outer retinal tubulation. SRHM - subretinal hyper-reflective material. IRHM - intraretinal hyper-reflective material      Intravitreal Injection, Pharmacologic Agent - OD - Right Eye       Time Out 09/29/2022. 7:57 AM. Confirmed correct patient, procedure, site, and patient consented.   Anesthesia Topical anesthesia was used. Anesthetic medications included Lidocaine 2%, Proparacaine 0.5%.   Procedure Preparation included 5% betadine to ocular surface, eyelid speculum. A (32g) needle was used.   Injection: 2 mg aflibercept 2 MG/0.05ML   Route: Intravitreal, Site: Right Eye   NDC: A3590391, Lot: [id number], Expiration date: 01/13/2024, Waste: 0 mL   Post-op Post injection exam found visual acuity of at least counting fingers. The patient tolerated the procedure well. There were no complications. The patient received written and verbal post procedure care education.      Intravitreal Injection, Pharmacologic Agent - OS - Left Eye       Time Out 09/29/2022. 7:57 AM. Confirmed correct patient, procedure, site, and patient consented.   Anesthesia Topical anesthesia  was used. Anesthetic medications included Lidocaine 2%, Proparacaine 0.5%.   Procedure Preparation included 5% betadine to ocular surface, eyelid speculum. A (32g) needle was used.   Injection: 2 mg aflibercept 2 MG/0.05ML   Route: Intravitreal, Site: Left Eye   NDC: A3590391, Lot: [id number], Expiration date: 01/13/2024, Waste: 0 mL   Post-op Post injection exam found visual acuity of at least counting fingers. The patient tolerated the procedure well. There were no complications. The patient received written and verbal post procedure care education. Post injection medications were not given.            ASSESSMENT/PLAN:    ICD-10-CM   1. 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 Essential hypertension  I10     3. Hypertensive retinopathy of both eyes  H35.033     4. Pseudophakia, both eyes  Z96.1      1. Peripapillary CNV / ex ARMD OU  - 8.1.22 pt with 1 wk history of decreased vision OS and isolated light green flashes OD -- flashes OD resolved, blurriness OS improving  - 10.04.22 small recurrent episode of flashes OD last week.   - 11.15.22 subjectively blurred vision OS             - s/p IVA OS #1 (11.15.22), #2 (12.13.22), #3 (01.10.23), #4 (02.09.23) -- IVA resistance  - s/p IVE  OS #1 (04.18.23), #2 (05.16.23), #3 (06.13.23), #4 (07.17.23), #5 (08.15.23), #6 (09.13.23)  - s/p IVE OD #1 (07.17.23), #2 (08.15.23), #3 (09.13.23) - BCVA 20/20 OU - stable  - exam and OCT shows -- OD Interval improvement in IRF/cystic changes nasal macula, interval improvement in focal SRF superior to disc; OS Interval improvement in peripapillary IRF/SRF nasal macula, +shallow SRF -- stably  resolved  - pt reports recent aggravation of lower back and received a steroid shot in lower back  - repeat FA (08.23.22) shows mild peripapillary staining and ?leakage OU -- ?CNV  - unclear etiology, but suspect ARMD vs idiopathic CNV; post-COVID vaccine? -- pt received 2nd booster end of Sept 2022  - recommend IVE OU (OD #4 and OS #7) today, 10.18.23  - pt wishes to proceed with injection  - RBA of procedure discussed, questions answered - IVA informed consent obtained and signed, 11.15.22 (OS) - IVE informed consent obtained and signed, 07.17.23 (OU) - see procedure note   - Amsler grid given to closely monitor for vision changes  - f/u 5-6 weeks, DFE, OCT, possible injection  2,3. Hypertensive retinopathy OU - discussed importance of tight BP control - continue to monitor  4. Pseudophakia OU  - s/p CE/IOL (Dr. Talbert Forest, 2022)  - IOLs in good position, doing well - continue to monitor  Ophthalmic Meds Ordered this visit:  Meds ordered this encounter  Medications   aflibercept (EYLEA) SOLN 2 mg   aflibercept (EYLEA) SOLN 2 mg     Return in about 6 weeks (around 11/10/2022) for f/u Ex. AMD , DFE, OCT, Possible, IVE, OU.  There are no Patient Instructions on file for this visit.  Explained the diagnoses, plan, and follow up with the patient and they expressed understanding.  Patient expressed understanding of the importance of proper follow up care.    This document serves as a record of services personally performed by Gardiner Sleeper, MD, PhD. It was created on their behalf by Orvan Falconer, an ophthalmic technician. The creation of this record is the provider's dictation and/or activities during the visit.    Electronically signed by: Orvan Falconer, OA, 09/29/22  9:09 AM  This document serves as a record of services personally performed by Gardiner Sleeper, MD, PhD. It was created on their behalf by Renaldo Reel, COT an ophthalmic technician. The creation of this  record is the provider's dictation and/or activities during the visit.    Electronically signed by:  Renaldo Reel, COT  10.18.23 9:09 AM  Gardiner Sleeper, M.D., Ph.D. Diseases & Surgery of the Retina and Vitreous [facility]  I have reviewed the above documentation for accuracy and completeness, and I agree with the above. Gardiner Sleeper, M.D., Ph.D. 09/29/22 9:09 AM  Abbreviations: M myopia (nearsighted); A astigmatism; H hyperopia (farsighted); P presbyopia; Mrx spectacle prescription;  CTL contact lenses; OD right eye; OS left eye; OU both eyes  XT exotropia; ET esotropia; PEK punctate epithelial keratitis; PEE punctate epithelial erosions; DES dry eye syndrome; MGD meibomian gland dysfunction; ATs artificial tears; PFAT's preservative free artificial tears; [place] nuclear sclerotic cataract; PSC posterior subcapsular cataract; ERM epi-retinal membrane; PVD posterior vitreous detachment; RD retinal detachment; DM diabetes mellitus; DR diabetic retinopathy; NPDR non-proliferative diabetic retinopathy; PDR proliferative diabetic retinopathy; CSME clinically significant macular edema; DME diabetic macular edema; dbh dot blot hemorrhages; CWS cotton wool spot; POAG primary open angle glaucoma; C/D cup-to-disc ratio; HVF humphrey visual field; GVF goldmann visual field; OCT  optical coherence tomography; IOP intraocular pressure; BRVO Branch retinal vein occlusion; CRVO central retinal vein occlusion; CRAO central retinal artery occlusion; BRAO branch retinal artery occlusion; RT retinal tear; SB scleral buckle; PPV pars plana vitrectomy; VH Vitreous hemorrhage; PRP panretinal laser photocoagulation; IVK intravitreal kenalog; VMT vitreomacular traction; MH Macular hole;  NVD neovascularization of the disc; NVE neovascularization elsewhere; AREDS age related eye disease study; ARMD age related macular degeneration; POAG primary open angle glaucoma; EBMD epithelial/anterior basement  membrane dystrophy; ACIOL anterior chamber intraocular lens; IOL intraocular lens; PCIOL posterior chamber intraocular lens; Phaco/IOL phacoemulsification with intraocular lens placement; [place] photorefractive keratectomy; LASIK laser assisted in situ keratomileusis; HTN hypertension; DM diabetes mellitus; COPD chronic obstructive pulmonary disease

## 2022-09-29 ENCOUNTER — Encounter (INDEPENDENT_AMBULATORY_CARE_PROVIDER_SITE_OTHER): Payer: Self-pay | Admitting: Ophthalmology

## 2022-09-29 ENCOUNTER — Ambulatory Visit (INDEPENDENT_AMBULATORY_CARE_PROVIDER_SITE_OTHER): Payer: PPO | Admitting: Ophthalmology

## 2022-09-29 DIAGNOSIS — H353231 Exudative age-related macular degeneration, bilateral, with active choroidal neovascularization: Secondary | ICD-10-CM

## 2022-09-29 DIAGNOSIS — H35033 Hypertensive retinopathy, bilateral: Secondary | ICD-10-CM

## 2022-09-29 DIAGNOSIS — Z961 Presence of intraocular lens: Secondary | ICD-10-CM

## 2022-09-29 DIAGNOSIS — I10 Essential (primary) hypertension: Secondary | ICD-10-CM

## 2022-09-29 DIAGNOSIS — H539 Unspecified visual disturbance: Secondary | ICD-10-CM

## 2022-09-29 MED ORDER — AFLIBERCEPT 2MG/0.05ML IZ SOLN FOR KALEIDOSCOPE
2.0000 mg | INTRAVITREAL | Status: AC | PRN
  Administered 2022-09-29: 2 mg via INTRAVITREAL

## 2022-11-02 NOTE — Progress Notes (Signed)
[facility] Note  11/08/2022    CHIEF COMPLAINT Patient presents for Retina Follow Up  HISTORY OF PRESENT ILLNESS: Raymond Alexander is a 76 y.o. male who presents to the clinic today for:   HPI     Retina Follow Up   Patient presents with  Wet AMD.  In both eyes.  This started weeks ago.  Severity is moderate.  Duration of 6 weeks.  Since onset it is gradually improving.  I, the attending physician,  performed the HPI with the patient and updated documentation appropriately.        Comments   Patient states vision improved some OU.      Last edited by Bernarda Caffey, MD on 11/08/2022 12:56 PM.     Patient saw Dr. Nicki Reaper last Monday and states his vision was 42/20  Referring physician: Lujean Amel, MD [address],  [place] [address]  HISTORICAL INFORMATION:   Selected notes from the MEDICAL RECORD NUMBER Referred by Dr. Nicki Reaper for concern of retinal edema OU   CURRENT MEDICATIONS: No current outpatient medications on file. (Ophthalmic Drugs)   No current facility-administered medications for this visit. (Ophthalmic Drugs)   Current Outpatient Medications (Other)  Medication Sig   aspirin 81 MG tablet Take 81 mg by mouth daily.   atorvastatin (LIPITOR) 80 MG tablet Take 80 mg by mouth daily.   Coenzyme Q10 (CO Q-10) 100 MG CAPS Take 300 mg by mouth daily.   ezetimibe (ZETIA) 10 MG tablet Take 10 mg by mouth daily.   fexofenadine (ALLEGRA) 180 MG tablet Take 180 mg by mouth daily as needed for allergies.    fluticasone (FLONASE) 50 MCG/ACT nasal spray Place 1 spray into both nostrils daily.   IBUPROFEN & ACETAMINOPHEN PO Take by mouth as needed.   lisinopril (PRINIVIL,ZESTRIL) 10 MG tablet Take 10 mg by mouth daily.   Mag Aspart-Potassium Aspart (POTASSIUM & MAGNESIUM ASPARTAT) 250-250 MG CAPS Take 1 tablet by mouth daily.   Multiple Vitamin (MULTIVITAMIN) tablet Take 1 tablet by mouth daily.   nitroGLYCERIN  (NITROSTAT) 0.4 MG SL tablet Place 0.4 mg under the tongue every 5 (five) minutes as needed for chest pain.   Omega-3 Fatty Acids (FISH OIL) 1000 MG CAPS Take 2 capsules by mouth daily.   OVER THE COUNTER MEDICATION Take 1 tablet by mouth daily. Med Name: PROTANDIM   sildenafil (VIAGRA) 50 MG tablet Take 50 mg by mouth daily as needed for erectile dysfunction.   No current facility-administered medications for this visit. (Other)   REVIEW OF SYSTEMS: ROS   Positive for: Cardiovascular, Eyes Negative for: Constitutional, Gastrointestinal, Neurological, Skin, Genitourinary, Musculoskeletal, HENT, Endocrine, Respiratory, Psychiatric, Allergic/Imm, Heme/Lymph Last edited by Roselee Nova D, COT on 11/08/2022  8:52 AM.     ALLERGIES No Known Allergies  PAST MEDICAL HISTORY Past Medical History:  Diagnosis Date   ASCVD (arteriosclerotic cardiovascular disease)    3 vessel   Clotting disorder ([place])    Esophageal reflux    Hyperlipidemia    Hypertension    Macular degeneration    Past Surgical History:  Procedure Laterality Date   CATARACT EXTRACTION     PTCA  10/14/2003   mid RCA   FAMILY HISTORY Family History  Problem Relation Age of Onset   Heart attack Father    Cancer Mother    Diabetes Sister    Cancer Brother    Healthy Brother    SOCIAL HISTORY Social History  Tobacco Use   Smoking status: Former   Smokeless tobacco: Never  Vaping Use   Vaping Use: Never used  Substance Use Topics   Alcohol use: Yes    Alcohol/week: 1.0 standard drink of alcohol    Types: 1 Glasses of wine per week    Comment: 1-2 per day   Drug use: No       OPHTHALMIC EXAM:  Base Eye Exam     Visual Acuity (Snellen - Linear)       Right Left   Dist cc 20/20 20/20 -2    Correction: Glasses         Tonometry (Tonopen, 8:56 AM)       Right Left   Pressure 13 12         Pupils       Dark Light Shape React APD   Right 3 2 Round Brisk None   Left 3 2 Round Brisk None          Visual Fields (Counting fingers)       Left Right    Full Full         Extraocular Movement       Right Left    Full, Ortho Full, Ortho         Neuro/Psych     Oriented x3: Yes   Mood/Affect: Normal         Dilation     Both eyes: 1.0% Mydriacyl, 2.5% Phenylephrine @ 8:56 AM           Slit Lamp and Fundus Exam     Slit Lamp Exam       Right Left   Lids/Lashes Normal small stye nasal LL margin   Conjunctiva/Sclera White and quiet White and quiet   Cornea arcus, well healed cataract wound, trace PEE arcus, well healed cataract wound, trace PEE   Anterior Chamber Deep and quiet Deep and quiet   Iris Round and dilated Round and dilated   Lens PC IOL in good position Toric PC IOL in good position with marks at 0530 and 1100   Anterior Vitreous Mild Vitreous syneresis, no cell or pigment, low lying Posterior vitreous detachment, Weiss ring Mild Vitreous syneresis, no cell or pigment, Posterior vitreous detachment         Fundus Exam       Right Left   Disc Pink and Sharp, mild peripapillary edema - improved ?peripapillary CNV ST to disc -- persistent, no heme Pink and Sharp, peripapillary edema -- improved, CNV ST disc, no heme   C/D Ratio 0.5 0.4   Macula Good foveal reflex, nasal cystic changes/edema - improved, fine drusen vs exudate - improved, no heme Good foveal reflex, mild edema nasal macula -- improved, no heme   Vessels attenuated, mild tortuosity, mild copper wiring mild attenuation, mild Copper wiring, mild AV crossing changes   Periphery Attached, No heme, no RT/RD Attached, No heme            Refraction     Wearing Rx       Sphere Cylinder Axis Add   Right [place] +0.25 060 +2.25   Left +0.25 Sphere  +2.25           IMAGING AND PROCEDURES  Imaging and Procedures for 11/08/2022  OCT, Retina - OU - Both Eyes       Right Eye Quality was good. Central Foveal Thickness: 286. Progression has improved. Findings include  normal foveal contour, no SRF, retinal drusen ,  intraretinal fluid, pigment epithelial detachment (Interval improvement in IRF/cystic changes nasal macula, interval improvement in focal SRF superior to disc).   Left Eye Quality was good. Central Foveal Thickness: 271. Progression has improved. Findings include normal foveal contour, no SRF, retinal drusen , intraretinal fluid (Mild interval improvement in peripapillary IRF nasal macula, +shallow SRF -- stably resolved).   Notes *Images captured and stored on drive  Diagnosis / Impression:  OD Interval improvement in IRF/cystic changes nasal macula, interval improvement in focal SRF superior to disc -- resolved OS Mild interval improvement in peripapillary IRF nasal macula, +shallow SRF -- stably resolved  Clinical management:  See below  Abbreviations: NFP - Normal foveal profile. CME - cystoid macular edema. PED - pigment epithelial detachment. IRF - intraretinal fluid. SRF - subretinal fluid. EZ - ellipsoid zone. ERM - epiretinal membrane. ORA - outer retinal atrophy. ORT - outer retinal tubulation. SRHM - subretinal hyper-reflective material. IRHM - intraretinal hyper-reflective material      Intravitreal Injection, Pharmacologic Agent - OD - Right Eye       Time Out 11/08/2022. 9:13 AM. Confirmed correct patient, procedure, site, and patient consented.   Anesthesia Topical anesthesia was used. Anesthetic medications included Lidocaine 2%, Proparacaine 0.5%.   Procedure Preparation included 5% betadine to ocular surface, eyelid speculum. A (32g) needle was used.   Injection: 2 mg aflibercept 2 MG/0.05ML   Route: Intravitreal, Site: Right Eye   NDC: A3590391, Lot: [id number], Expiration date: 02/10/2024, Waste: 0 mL   Post-op Post injection exam found visual acuity of at least counting fingers. The patient tolerated the procedure well. There were no complications. The patient received written and verbal post procedure care  education.      Intravitreal Injection, Pharmacologic Agent - OS - Left Eye       Time Out 11/08/2022. 9:13 AM. Confirmed correct patient, procedure, site, and patient consented.   Anesthesia Topical anesthesia was used. Anesthetic medications included Lidocaine 2%, Proparacaine 0.5%.   Procedure Preparation included 5% betadine to ocular surface, eyelid speculum. A (32g) needle was used.   Injection: 2 mg aflibercept 2 MG/0.05ML   Route: Intravitreal, Site: Left Eye   NDC: M7179715, Lot: [id number], Expiration date: 04/12/2023, Waste: 0 mL   Post-op Post injection exam found visual acuity of at least counting fingers. The patient tolerated the procedure well. There were no complications. The patient received written and verbal post procedure care education. Post injection medications were not given.            ASSESSMENT/PLAN:    ICD-10-CM   1. 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 Essential hypertension  I10     3. Hypertensive retinopathy of both eyes  H35.033     4. Pseudophakia, both eyes  Z96.1      1. Peripapillary CNV / ex ARMD OU  - 8.1.22 pt with 1 wk history of decreased vision OS and isolated light green flashes OD -- flashes OD resolved, blurriness OS improving  - 10.04.22 small recurrent episode of flashes OD last week.   - 11.15.22 subjectively blurred vision OS             -  s/p IVA OS #1 (11.15.22), #2 (12.13.22), #3 (01.10.23), #4 (02.09.23) -- IVA resistance  - s/p IVE OS #1 (04.18.23), #2 (05.16.23), #3 (06.13.23), #4 (07.17.23), #5 (08.15.23), #6 (09.13.23), #7 (10.18.23)  - s/p IVE OD #1 (07.17.23), #2 (08.15.23), #3 (09.13.23), #4  (10.18.23) - BCVA 20/20 OU - stable  - exam and OCT shows OD Interval improvement in IRF/cystic changes nasal macula, interval improvement in focal SRF superior to disc -- resolved; OS Mild interval improvement in peripapillary IRF nasal macula, +shallow SRF stably resolved at 5.5 weeks  - repeat FA (08.23.22) shows mild peripapillary staining and ?leakage OU -- ?CNV  - unclear etiology, but suspect ARMD vs idiopathic CNV; post-COVID vaccine? -- pt received 2nd booster end of Sept 2022  - recommend IVE OU (OD #5 and OS #6) today, 11.27.23 with follow up extended to 6 weeks  - pt wishes to proceed with injection  - RBA of procedure discussed, questions answered - IVA informed consent obtained and signed, 11.15.22 (OS) - IVE informed consent obtained and signed, 07.17.23 (OU) - see procedure note   - [place]ler grid given to closely monitor for vision changes  - f/u 6 weeks, DFE, OCT, possible injection  2,3. Hypertensive retinopathy OU - discussed importance of tight BP control - continue to monitor  4. Pseudophakia OU  - s/p CE/IOL (Dr. Talbert Forest, 2022)  - IOLs in good position, doing well - continue to monitor  Ophthalmic Meds Ordered this visit:  Meds ordered this encounter  Medications   aflibercept (EYLEA) SOLN 2 mg   aflibercept (EYLEA) SOLN 2 mg     Return in about 6 weeks (around 12/20/2022) for f/u exu ARMD OU, DFE, OCT.  There are no Patient Instructions on file for this visit.  Explained the diagnoses, plan, and follow up with the patient and they expressed understanding.  Patient expressed understanding of the importance of proper follow up care.    This document serves as a record of services personally performed by Gardiner Sleeper, MD, PhD. It was created on their behalf by Roselee Nova, COMT. The creation of this record is the provider's dictation and/or activities during the visit.  Electronically signed by: Roselee Nova, COMT 11/08/22 12:58 PM  This document serves as a  record of services personally performed by Gardiner Sleeper, MD, PhD. It was created on their behalf by San Jetty. Owens Shark, OA an ophthalmic technician. The creation of this record is the provider's dictation and/or activities during the visit.    Electronically signed by: San Jetty. Owens Shark, [place] 11.27.2023 12:58 PM  Gardiner Sleeper, M.D., Ph.D. Diseases & Surgery of the Retina and Vitreous [facility]  I have reviewed the above documentation for accuracy and completeness, and I agree with the above. Gardiner Sleeper, M.D., Ph.D. 11/08/22 12:58 PM  Abbreviations: M myopia (nearsighted); A astigmatism; H hyperopia (farsighted); P presbyopia; Mrx spectacle prescription;  CTL contact lenses; OD right eye; OS left eye; OU both eyes  XT exotropia; ET esotropia; PEK punctate epithelial keratitis; PEE punctate epithelial erosions; DES dry eye syndrome; MGD meibomian gland dysfunction; ATs artificial tears; PFAT's preservative free artificial tears; [place] nuclear sclerotic cataract; PSC posterior subcapsular cataract; ERM epi-retinal membrane; PVD posterior vitreous detachment; RD retinal detachment; DM diabetes mellitus; DR diabetic retinopathy; NPDR non-proliferative diabetic retinopathy; PDR proliferative diabetic retinopathy; CSME clinically significant macular edema; DME diabetic macular edema; dbh dot blot hemorrhages; CWS cotton wool spot; POAG primary open angle glaucoma; C/D cup-to-disc ratio; HVF  humphrey visual field; GVF goldmann visual field; OCT optical coherence tomography; IOP intraocular pressure; BRVO Branch retinal vein occlusion; CRVO central retinal vein occlusion; CRAO central retinal artery occlusion; BRAO branch retinal artery occlusion; RT retinal tear; SB scleral buckle; PPV pars plana vitrectomy; VH Vitreous hemorrhage; PRP panretinal laser photocoagulation; IVK intravitreal kenalog; VMT vitreomacular traction; MH Macular hole;  NVD neovascularization of the disc; NVE  neovascularization elsewhere; AREDS age related eye disease study; ARMD age related macular degeneration; POAG primary open angle glaucoma; EBMD epithelial/anterior basement membrane dystrophy; ACIOL anterior chamber intraocular lens; IOL intraocular lens; PCIOL posterior chamber intraocular lens; Phaco/IOL phacoemulsification with intraocular lens placement; [place] photorefractive keratectomy; LASIK laser assisted in situ keratomileusis; HTN hypertension; DM diabetes mellitus; COPD chronic obstructive pulmonary disease

## 2022-11-06 DIAGNOSIS — E663 Overweight: Secondary | ICD-10-CM | POA: Diagnosis not present

## 2022-11-06 DIAGNOSIS — E785 Hyperlipidemia, unspecified: Secondary | ICD-10-CM | POA: Diagnosis not present

## 2022-11-06 DIAGNOSIS — Z9849 Cataract extraction status, unspecified eye: Secondary | ICD-10-CM | POA: Diagnosis not present

## 2022-11-06 DIAGNOSIS — H353231 Exudative age-related macular degeneration, bilateral, with active choroidal neovascularization: Secondary | ICD-10-CM | POA: Diagnosis not present

## 2022-11-06 DIAGNOSIS — Z9861 Coronary angioplasty status: Secondary | ICD-10-CM | POA: Diagnosis not present

## 2022-11-06 DIAGNOSIS — I10 Essential (primary) hypertension: Secondary | ICD-10-CM | POA: Diagnosis not present

## 2022-11-06 DIAGNOSIS — Z7982 Long term (current) use of aspirin: Secondary | ICD-10-CM | POA: Diagnosis not present

## 2022-11-06 DIAGNOSIS — I2510 Atherosclerotic heart disease of native coronary artery without angina pectoris: Secondary | ICD-10-CM | POA: Diagnosis not present

## 2022-11-06 DIAGNOSIS — M1990 Unspecified osteoarthritis, unspecified site: Secondary | ICD-10-CM | POA: Diagnosis not present

## 2022-11-08 ENCOUNTER — Ambulatory Visit (INDEPENDENT_AMBULATORY_CARE_PROVIDER_SITE_OTHER): Payer: PPO | Admitting: Ophthalmology

## 2022-11-08 ENCOUNTER — Encounter (INDEPENDENT_AMBULATORY_CARE_PROVIDER_SITE_OTHER): Payer: Self-pay | Admitting: Ophthalmology

## 2022-11-08 DIAGNOSIS — H353231 Exudative age-related macular degeneration, bilateral, with active choroidal neovascularization: Secondary | ICD-10-CM | POA: Diagnosis not present

## 2022-11-08 DIAGNOSIS — I10 Essential (primary) hypertension: Secondary | ICD-10-CM

## 2022-11-08 DIAGNOSIS — Z961 Presence of intraocular lens: Secondary | ICD-10-CM | POA: Diagnosis not present

## 2022-11-08 DIAGNOSIS — H35033 Hypertensive retinopathy, bilateral: Secondary | ICD-10-CM | POA: Diagnosis not present

## 2022-11-08 MED ORDER — AFLIBERCEPT 2MG/0.05ML IZ SOLN FOR KALEIDOSCOPE
2.0000 mg | INTRAVITREAL | Status: AC | PRN
  Administered 2022-11-08: 2 mg via INTRAVITREAL

## 2022-12-02 DIAGNOSIS — E7800 Pure hypercholesterolemia, unspecified: Secondary | ICD-10-CM | POA: Diagnosis not present

## 2022-12-02 DIAGNOSIS — I2510 Atherosclerotic heart disease of native coronary artery without angina pectoris: Secondary | ICD-10-CM | POA: Diagnosis not present

## 2022-12-02 DIAGNOSIS — R7303 Prediabetes: Secondary | ICD-10-CM | POA: Diagnosis not present

## 2022-12-02 DIAGNOSIS — I10 Essential (primary) hypertension: Secondary | ICD-10-CM | POA: Diagnosis not present

## 2022-12-02 DIAGNOSIS — Z79899 Other long term (current) drug therapy: Secondary | ICD-10-CM | POA: Diagnosis not present

## 2022-12-02 DIAGNOSIS — Z0001 Encounter for general adult medical examination with abnormal findings: Secondary | ICD-10-CM | POA: Diagnosis not present

## 2022-12-02 DIAGNOSIS — M5431 Sciatica, right side: Secondary | ICD-10-CM | POA: Diagnosis not present

## 2022-12-14 NOTE — Progress Notes (Signed)
[facility] Note  12/16/2022    CHIEF COMPLAINT Patient presents for Retina Follow Up  HISTORY OF PRESENT ILLNESS: Raymond Alexander is a 77 y.o. male who presents to the clinic today for:   HPI     Retina Follow Up   Patient presents with  Wet AMD.  In both eyes.  This started 6 weeks ago.  I, the attending physician,  performed the HPI with the patient and updated documentation appropriately.        Comments   Patient here for 6 weeks retina follow up for exu ARMD OU. Patient states vision doing good. No eye pain. No floaters in last month or so.      Last edited by Bernarda Caffey, MD on 12/16/2022 12:58 PM.      Referring physician: Lujean Amel, MD [address],  [place] [address]  HISTORICAL INFORMATION:   Selected notes from the MEDICAL RECORD NUMBER Referred by Dr. Nicki Reaper for concern of retinal edema OU   CURRENT MEDICATIONS: No current outpatient medications on file. (Ophthalmic Drugs)   No current facility-administered medications for this visit. (Ophthalmic Drugs)   Current Outpatient Medications (Other)  Medication Sig   aspirin 81 MG tablet Take 81 mg by mouth daily.   atorvastatin (LIPITOR) 80 MG tablet Take 80 mg by mouth daily.   Coenzyme Q10 (CO Q-10) 100 MG CAPS Take 300 mg by mouth daily.   ezetimibe (ZETIA) 10 MG tablet Take 10 mg by mouth daily.   fexofenadine (ALLEGRA) 180 MG tablet Take 180 mg by mouth daily as needed for allergies.    fluticasone (FLONASE) 50 MCG/ACT nasal spray Place 1 spray into both nostrils daily.   IBUPROFEN & ACETAMINOPHEN PO Take by mouth as needed.   lisinopril (PRINIVIL,ZESTRIL) 10 MG tablet Take 10 mg by mouth daily.   Mag Aspart-Potassium Aspart (POTASSIUM & MAGNESIUM ASPARTAT) 250-250 MG CAPS Take 1 tablet by mouth daily.   Multiple Vitamin (MULTIVITAMIN) tablet Take 1 tablet by mouth daily.   nitroGLYCERIN (NITROSTAT) 0.4 MG SL tablet Place 0.4 mg under the tongue  every 5 (five) minutes as needed for chest pain.   Omega-3 Fatty Acids (FISH OIL) 1000 MG CAPS Take 2 capsules by mouth daily.   OVER THE COUNTER MEDICATION Take 1 tablet by mouth daily. Med Name: PROTANDIM   sildenafil (VIAGRA) 50 MG tablet Take 50 mg by mouth daily as needed for erectile dysfunction.   No current facility-administered medications for this visit. (Other)   REVIEW OF SYSTEMS: ROS   Positive for: Cardiovascular, Eyes Negative for: Constitutional, Gastrointestinal, Neurological, Skin, Genitourinary, Musculoskeletal, HENT, Endocrine, Respiratory, Psychiatric, Allergic/Imm, Heme/Lymph Last edited by Theodore Demark, COA on 12/16/2022  8:48 AM.      ALLERGIES No Known Allergies  PAST MEDICAL HISTORY Past Medical History:  Diagnosis Date   ASCVD (arteriosclerotic cardiovascular disease)    3 vessel   Clotting disorder ([place])    Esophageal reflux    Hyperlipidemia    Hypertension    Macular degeneration    Past Surgical History:  Procedure Laterality Date   CATARACT EXTRACTION     PTCA  10/14/2003   mid RCA   FAMILY HISTORY Family History  Problem Relation Age of Onset   Heart attack Father    Cancer Mother    Diabetes Sister    Cancer Brother    Healthy Brother    SOCIAL HISTORY Social History   Tobacco Use  Smoking status: Former   Smokeless tobacco: Never  Vaping Use   Vaping Use: Never used  Substance Use Topics   Alcohol use: Yes    Alcohol/week: 1.0 standard drink of alcohol    Types: 1 Glasses of wine per week    Comment: 1-2 per day   Drug use: No       OPHTHALMIC EXAM:  Base Eye Exam     Visual Acuity (Snellen - Linear)       Right Left   Dist cc 20/20 -2 20/20 -1    Correction: Glasses         Tonometry (Tonopen, 8:46 AM)       Right Left   Pressure 08 08         Pupils       Dark Light Shape React APD   Right 3 2 Round Brisk None   Left 3 2 Round Brisk None         Visual Fields (Counting fingers)        Left Right    Full Full         Extraocular Movement       Right Left    Full, Ortho Full, Ortho         Neuro/Psych     Oriented x3: Yes   Mood/Affect: Normal         Dilation     Both eyes: 1.0% Mydriacyl, 2.5% Phenylephrine @ 8:46 AM           Slit Lamp and Fundus Exam     Slit Lamp Exam       Right Left   Lids/Lashes Normal small stye nasal LL margin   Conjunctiva/Sclera White and quiet White and quiet   Cornea arcus, well healed cataract wound, trace PEE arcus, well healed cataract wound, trace PEE   Anterior Chamber Deep and quiet Deep and quiet   Iris Round and dilated Round and dilated   Lens PC IOL in good position Toric PC IOL in good position with marks at 0530 and 1100   Anterior Vitreous Mild Vitreous syneresis, no cell or pigment, low lying Posterior vitreous detachment, Weiss ring Mild Vitreous syneresis, no cell or pigment, Posterior vitreous detachment         Fundus Exam       Right Left   Disc Pink and Sharp, mild peripapillary edema - improved ?peripapillary CNV ST to disc -- persistent, no heme Pink and Sharp, peripapillary edema -- improved, CNV ST disc, no heme   C/D Ratio 0.5 0.4   Macula Good foveal reflex, nasal cystic changes/edema - improved, fine drusen vs exudate - improved, no heme Good foveal reflex, mild edema nasal macula -- improved, no heme   Vessels mild attenuation, mild tortuosity mild attenuation, mild Copper wiring, mild AV crossing changes   Periphery Attached, No heme, no RT/RD Attached, No heme            Refraction     Wearing Rx       Sphere Cylinder Axis Add   Right [place] +0.25 060 +2.25   Left +0.25 Sphere  +2.25           IMAGING AND PROCEDURES  Imaging and Procedures for 12/16/2022  OCT, Retina - OU - Both Eyes       Right Eye Quality was good. Central Foveal Thickness: 284. Progression has improved. Findings include normal foveal contour, no SRF, retinal drusen , intraretinal fluid,  pigment epithelial  detachment (Interval improvement in IRF/cystic changes nasal macula / peripapillary, stable improvement in focal SRF superior to disc).   Left Eye Quality was good. Central Foveal Thickness: 269. Progression has improved. Findings include normal foveal contour, no SRF, retinal drusen , intraretinal fluid (Mild interval improvement in peripapillary IRF nasal macula, +shallow SRF -- stably resolved).   Notes *Images captured and stored on drive  Diagnosis / Impression:  OD Interval improvement in IRF/cystic changes nasal macula / peripapillary, stable improvement in focal SRF superior to disc OS Mild interval improvement in peripapillary IRF nasal macula, +shallow SRF -- stably resolved  Clinical management:  See below  Abbreviations: NFP - Normal foveal profile. CME - cystoid macular edema. PED - pigment epithelial detachment. IRF - intraretinal fluid. SRF - subretinal fluid. EZ - ellipsoid zone. ERM - epiretinal membrane. ORA - outer retinal atrophy. ORT - outer retinal tubulation. SRHM - subretinal hyper-reflective material. IRHM - intraretinal hyper-reflective material      Intravitreal Injection, Pharmacologic Agent - OD - Right Eye       Time Out 12/16/2022. 9:18 AM. Confirmed correct patient, procedure, site, and patient consented.   Anesthesia Topical anesthesia was used. Anesthetic medications included Lidocaine 2%, Proparacaine 0.5%.   Procedure Preparation included 5% betadine to ocular surface, eyelid speculum. A (32g) needle was used.   Injection: 2 mg aflibercept 2 MG/0.05ML   Route: Intravitreal, Site: Right Eye   NDC: A3590391, Lot: [id number], Expiration date: 03/12/2024, Waste: 0 mL   Post-op Post injection exam found visual acuity of at least counting fingers. The patient tolerated the procedure well. There were no complications. The patient received written and verbal post procedure care education.      Intravitreal Injection,  Pharmacologic Agent - OS - Left Eye       Time Out 12/16/2022. 9:18 AM. Confirmed correct patient, procedure, site, and patient consented.   Anesthesia Topical anesthesia was used. Anesthetic medications included Lidocaine 2%, Proparacaine 0.5%.   Procedure Preparation included 5% betadine to ocular surface, eyelid speculum. A (32g) needle was used.   Injection: 2 mg aflibercept 2 MG/0.05ML   Route: Intravitreal, Site: Left Eye   NDC: A3590391, Lot: [id number], Expiration date: 02/10/2024, Waste: 0 mL   Post-op Post injection exam found visual acuity of at least counting fingers. The patient tolerated the procedure well. There were no complications. The patient received written and verbal post procedure care education. Post injection medications were not given.            ASSESSMENT/PLAN:    ICD-10-CM   1. 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 Essential hypertension  I10     3. Hypertensive retinopathy of both eyes  H35.033     4. Pseudophakia, both eyes  Z96.1      1. Peripapillary CNV / ex ARMD OU  - 8.1.22 pt with 1 wk history of decreased vision OS and isolated light green flashes OD -- flashes OD resolved, blurriness OS improving  - 10.04.22 small recurrent episode of flashes OD last week.   - 11.15.22 subjectively blurred vision OS             - s/p IVA  OS #1 (11.15.22), #2 (12.13.22), #3 (01.10.23), #4 (02.09.23) -- IVA resistance  - s/p IVE OS #1 (04.18.23), #2 (05.16.23), #3 (06.13.23), #4 (07.17.23), #5 (08.15.23), #6 (09.13.23), #7 (10.18.23), #8 (11.27.23)  - s/p IVE OD #1 (07.17.23), #2 (08.15.23), #3 (09.13.23), #4 (10.18.23), #5 (11.27.23) - BCVA 20/20 OU  - stable  - exam and OCT shows OD Interval improvement in IRF/cystic changes nasal macula / peripapillary, stable improvement in focal SRF superior to disc; OS Mild interval improvement in peripapillary IRF nasal macula, +shallow SRF -- stably resolved at 5.5 weeks  - repeat FA (08.23.22) shows mild peripapillary staining and ?leakage OU -- ?CNV  - unclear etiology, but suspect ARMD vs idiopathic CNV; post-COVID vaccine? -- pt received 2nd booster end of Sept 2022  - recommend IVE OU (OD #6 and OS #9) today, 01.04.24 with follow up in 5-6 weeks  - pt wishes to proceed with injection  - RBA of procedure discussed, questions answered - IVA informed consent obtained and signed, 11.15.22 (OS) - IVE informed consent obtained and signed, 07.17.23 (OU) - see procedure note   - Amsler grid given to closely monitor for vision changes  - f/u 5-6 weeks, DFE, OCT, possible injection  2,3. Hypertensive retinopathy OU - discussed importance of tight BP control - continue to monitor  4. Pseudophakia OU  - s/p CE/IOL (Dr. Talbert Forest, 2022)  - IOLs in good position, doing well - continue to monitor  Ophthalmic Meds Ordered this visit:  Meds ordered this encounter  Medications   aflibercept (EYLEA) SOLN 2 mg   aflibercept (EYLEA) SOLN 2 mg     Return for f/u 5-6 weeks, exu ARMD OU, DFE, OCT.  There are no Patient Instructions on file for this visit.  Explained the diagnoses, plan, and follow up with the patient and they expressed understanding.  Patient expressed understanding of the importance of proper follow up care.    This document serves as a record of services personally performed by Gardiner Sleeper, MD, PhD. It was created on their behalf by San Jetty. Owens Shark, OA an ophthalmic technician. The creation of this record is the provider's dictation and/or activities during the visit.    Electronically signed by: San Jetty. Owens Shark, [place] 01.02.2024 9:49 PM  Gardiner Sleeper, M.D., Ph.D. Diseases &  Surgery of the Retina and Vitreous [facility]  I have reviewed the above documentation for accuracy and completeness, and I agree with the above. Gardiner Sleeper, M.D., Ph.D. 12/16/22 9:50 PM   Abbreviations: M myopia (nearsighted); A astigmatism; H hyperopia (farsighted); P presbyopia; Mrx spectacle prescription;  CTL contact lenses; OD right eye; OS left eye; OU both eyes  XT exotropia; ET esotropia; PEK punctate epithelial keratitis; PEE punctate epithelial erosions; DES dry eye syndrome; MGD meibomian gland dysfunction; ATs artificial tears; PFAT's preservative free artificial tears; [place] nuclear sclerotic cataract; PSC posterior subcapsular cataract; ERM epi-retinal membrane; PVD posterior vitreous detachment; RD retinal detachment; DM diabetes mellitus; DR diabetic retinopathy; NPDR non-proliferative diabetic retinopathy; PDR proliferative diabetic retinopathy; CSME clinically significant macular edema; DME diabetic macular edema; dbh dot blot hemorrhages; CWS cotton wool spot; POAG primary open angle glaucoma; C/D cup-to-disc ratio; HVF humphrey visual field; GVF goldmann visual field; OCT optical coherence tomography; IOP intraocular pressure; BRVO Branch retinal vein occlusion; CRVO central retinal vein occlusion; CRAO central retinal artery occlusion; BRAO branch retinal artery occlusion; RT retinal tear; SB scleral buckle; PPV pars plana vitrectomy; VH Vitreous hemorrhage; PRP panretinal laser photocoagulation; IVK  intravitreal kenalog; VMT vitreomacular traction; MH Macular hole;  NVD neovascularization of the disc; NVE neovascularization elsewhere; AREDS age related eye disease study; ARMD age related macular degeneration; POAG primary open angle glaucoma; EBMD epithelial/anterior basement membrane dystrophy; ACIOL anterior chamber intraocular lens; IOL intraocular lens; PCIOL posterior chamber intraocular lens; Phaco/IOL phacoemulsification with intraocular lens placement;  [place] photorefractive keratectomy; LASIK laser assisted in situ keratomileusis; HTN hypertension; DM diabetes mellitus; COPD chronic obstructive pulmonary disease

## 2022-12-16 ENCOUNTER — Ambulatory Visit (INDEPENDENT_AMBULATORY_CARE_PROVIDER_SITE_OTHER): Payer: PPO | Admitting: Ophthalmology

## 2022-12-16 ENCOUNTER — Encounter (INDEPENDENT_AMBULATORY_CARE_PROVIDER_SITE_OTHER): Payer: Self-pay | Admitting: Ophthalmology

## 2022-12-16 DIAGNOSIS — H353231 Exudative age-related macular degeneration, bilateral, with active choroidal neovascularization: Secondary | ICD-10-CM | POA: Diagnosis not present

## 2022-12-16 DIAGNOSIS — I10 Essential (primary) hypertension: Secondary | ICD-10-CM | POA: Diagnosis not present

## 2022-12-16 DIAGNOSIS — Z961 Presence of intraocular lens: Secondary | ICD-10-CM

## 2022-12-16 DIAGNOSIS — H35033 Hypertensive retinopathy, bilateral: Secondary | ICD-10-CM | POA: Diagnosis not present

## 2022-12-16 MED ORDER — AFLIBERCEPT 2MG/0.05ML IZ SOLN FOR KALEIDOSCOPE
2.0000 mg | INTRAVITREAL | Status: AC | PRN
  Administered 2022-12-16: 2 mg via INTRAVITREAL

## 2023-01-13 NOTE — Progress Notes (Signed)
[facility] Note  01/27/2023    CHIEF COMPLAINT Patient presents for Retina Follow Up  HISTORY OF PRESENT ILLNESS: Raymond Alexander is a 77 y.o. male who presents to the clinic today for:   HPI     Retina Follow Up   Patient presents with  Wet AMD.  In both eyes.  This started 6 weeks ago.  Duration of 6 weeks.  Since onset it is stable.  I, the attending physician,  performed the HPI with the patient and updated documentation appropriately.        Comments   6 week Retina follow up ARMD OU and I'VE OU pt states no vision changes noticed He has noticed some flashes in the right eye        Last edited by Bernarda Caffey, MD on 01/27/2023 11:58 AM.    Pt states he has had a few "minor" fol recently   Referring physician: Lujean Amel, MD [address],  [place] [address]  HISTORICAL INFORMATION:   Selected notes from the MEDICAL RECORD NUMBER Referred by Dr. Nicki Reaper for concern of retinal edema OU   CURRENT MEDICATIONS: No current outpatient medications on file. (Ophthalmic Drugs)   No current facility-administered medications for this visit. (Ophthalmic Drugs)   Current Outpatient Medications (Other)  Medication Sig   aspirin 81 MG tablet Take 81 mg by mouth daily.   atorvastatin (LIPITOR) 80 MG tablet Take 80 mg by mouth daily.   Coenzyme Q10 (CO Q-10) 100 MG CAPS Take 300 mg by mouth daily.   ezetimibe (ZETIA) 10 MG tablet Take 10 mg by mouth daily.   fexofenadine (ALLEGRA) 180 MG tablet Take 180 mg by mouth daily as needed for allergies.    fluticasone (FLONASE) 50 MCG/ACT nasal spray Place 1 spray into both nostrils daily.   IBUPROFEN & ACETAMINOPHEN PO Take by mouth as needed.   lisinopril (PRINIVIL,ZESTRIL) 10 MG tablet Take 10 mg by mouth daily.   Mag Aspart-Potassium Aspart (POTASSIUM & MAGNESIUM ASPARTAT) 250-250 MG CAPS Take 1 tablet by mouth daily.   Multiple Vitamin (MULTIVITAMIN) tablet Take 1 tablet by  mouth daily.   nitroGLYCERIN (NITROSTAT) 0.4 MG SL tablet Place 0.4 mg under the tongue every 5 (five) minutes as needed for chest pain.   Omega-3 Fatty Acids (FISH OIL) 1000 MG CAPS Take 2 capsules by mouth daily.   OVER THE COUNTER MEDICATION Take 1 tablet by mouth daily. Med Name: PROTANDIM   sildenafil (VIAGRA) 50 MG tablet Take 50 mg by mouth daily as needed for erectile dysfunction.   No current facility-administered medications for this visit. (Other)   REVIEW OF SYSTEMS: ROS   Positive for: Cardiovascular, Eyes Negative for: Constitutional, Gastrointestinal, Neurological, Skin, Genitourinary, Musculoskeletal, HENT, Endocrine, Respiratory, Psychiatric, Allergic/Imm, Heme/Lymph Last edited by Parthenia Ames, COT on 01/27/2023  8:31 AM.     ALLERGIES No Known Allergies  PAST MEDICAL HISTORY Past Medical History:  Diagnosis Date   ASCVD (arteriosclerotic cardiovascular disease)    3 vessel   Clotting disorder (Onset)    Esophageal reflux    Hyperlipidemia    Hypertension    Macular degeneration    Past Surgical History:  Procedure Laterality Date   CATARACT EXTRACTION     PTCA  10/14/2003   mid RCA   FAMILY HISTORY Family History  Problem Relation Age of Onset   Heart attack Father    Cancer Mother    Diabetes Sister  Cancer Brother    Healthy Brother    SOCIAL HISTORY Social History   Tobacco Use   Smoking status: Former   Smokeless tobacco: Never  Scientific laboratory technician Use: Never used  Substance Use Topics   Alcohol use: Yes    Alcohol/week: 1.0 standard drink of alcohol    Types: 1 Glasses of wine per week    Comment: 1-2 per day   Drug use: No       OPHTHALMIC EXAM:  Base Eye Exam     Visual Acuity (Snellen - Linear)       Right Left   Dist cc 20/20 -2 20/20         Tonometry (Tonopen, 8:34 AM)       Right Left   Pressure 14 13         Pupils       Pupils Dark Light Shape React APD   Right PERRL 3 2 Round Brisk None    Left PERRL 3 2 Round Brisk None         Visual Fields       Left Right    Full Full         Extraocular Movement       Right Left    Full, Ortho Full, Ortho         Neuro/Psych     Oriented x3: Yes   Mood/Affect: Normal         Dilation     Both eyes: 2.5% Phenylephrine @ 8:34 AM           Slit Lamp and Fundus Exam     Slit Lamp Exam       Right Left   Lids/Lashes Normal small stye nasal LL margin   Conjunctiva/Sclera White and quiet White and quiet   Cornea arcus, well healed cataract wound, trace PEE arcus, well healed cataract wound, trace PEE   Anterior Chamber Deep and quiet Deep and quiet   Iris Round and dilated Round and dilated   Lens PC IOL in good position Toric PC IOL in good position with marks at 0530 and 1100   Anterior Vitreous Mild Vitreous syneresis, no cell or pigment, low lying Posterior vitreous detachment, Weiss ring Mild Vitreous syneresis, no cell or pigment, Posterior vitreous detachment         Fundus Exam       Right Left   Disc Pink and Sharp, mild peripapillary edema - persistent ?peripapillary CNV ST to disc -- persistent, no heme Pink and Sharp, peripapillary edema -- persistent, CNV ST disc, no heme   C/D Ratio 0.5 0.4   Macula Good foveal reflex, nasal cystic changes/edema - persistent, fine drusen vs exudate - improved, no heme Good foveal reflex, mild edema nasal macula -- improved, no heme   Vessels mild attenuation, mild tortuosity mild attenuation, mild Copper wiring, mild AV crossing changes   Periphery Attached, No heme, no RT/RD Attached, No heme            Refraction     Wearing Rx       Sphere Cylinder Axis Add   Right [place] +0.25 060 +2.25   Left +0.25 Sphere  +2.25           IMAGING AND PROCEDURES  Imaging and Procedures for 01/27/2023  OCT, Retina - OU - Both Eyes       Right Eye Quality was good. Central Foveal Thickness: 281. Progression has been stable. Findings  include normal  foveal contour, no SRF, retinal drusen , intraretinal fluid, pigment epithelial detachment (Persistent IRF/cystic changes nasal macula / peripapillary, stable improvement in focal SRF superior to disc).   Left Eye Quality was good. Central Foveal Thickness: 269. Progression has been stable. Findings include normal foveal contour, no SRF, retinal drusen , intraretinal fluid (Persistent peripapillary IRF nasal macula; SRF stably improved).   Notes *Images captured and stored on drive  Diagnosis / Impression:  OD: Persistent IRF/cystic changes nasal macula / peripapillary, stable improvement in focal SRF superior to disc OS: Persistent peripapillary IRF nasal macula; SRF stably improved  Clinical management:  See below  Abbreviations: NFP - Normal foveal profile. CME - cystoid macular edema. PED - pigment epithelial detachment. IRF - intraretinal fluid. SRF - subretinal fluid. EZ - ellipsoid zone. ERM - epiretinal membrane. ORA - outer retinal atrophy. ORT - outer retinal tubulation. SRHM - subretinal hyper-reflective material. IRHM - intraretinal hyper-reflective material      Intravitreal Injection, Pharmacologic Agent - OD - Right Eye       Time Out 01/27/2023. 9:23 AM. Confirmed correct patient, procedure, site, and patient consented.   Anesthesia Topical anesthesia was used. Anesthetic medications included Lidocaine 2%, Proparacaine 0.5%.   Procedure Preparation included 5% betadine to ocular surface, eyelid speculum. A (32g) needle was used.   Injection: 2 mg aflibercept 2 MG/0.05ML   Route: Intravitreal, Site: Right Eye   NDC: O5083423, Lot: [id number], Expiration date: 02/10/2024, Waste: 0 mL   Post-op Post injection exam found visual acuity of at least counting fingers. The patient tolerated the procedure well. There were no complications. The patient received written and verbal post procedure care education. Post injection medications were not given.       Intravitreal Injection, Pharmacologic Agent - OS - Left Eye       Time Out 01/27/2023. 9:24 AM. Confirmed correct patient, procedure, site, and patient consented.   Anesthesia Topical anesthesia was used. Anesthetic medications included Lidocaine 2%, Proparacaine 0.5%.   Procedure Preparation included 5% betadine to ocular surface, eyelid speculum. A (32g) needle was used.   Injection: 2 mg aflibercept 2 MG/0.05ML   Route: Intravitreal, Site: Left Eye   NDC: O5083423, Lot: [id number], Expiration date: 02/10/2024, Waste: 0 mL   Post-op Post injection exam found visual acuity of at least counting fingers. The patient tolerated the procedure well. There were no complications. The patient received written and verbal post procedure care education. Post injection medications were not given.            ASSESSMENT/PLAN:    ICD-10-CM   1. 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 Essential hypertension  I10     3. Hypertensive retinopathy of both eyes  H35.033     4. Pseudophakia, both eyes  Z96.1      1. Peripapillary CNV / ex ARMD OU  - 8.1.22 pt with 1 wk history of decreased vision OS and isolated light green flashes OD -- flashes OD resolved, blurriness OS improving  - 10.04.22 small recurrent episode of flashes OD last week.   - 11.15.22 subjectively blurred vision OS             -  s/p IVA OS #1 (11.15.22), #2 (12.13.22), #3 (01.10.23), #4 (02.09.23) -- IVA resistance  - s/p IVE OS #1 (04.18.23), #2 (05.16.23), #3 (06.13.23), #4 (07.17.23), #5 (08.15.23), #6 (09.13.23), #7 (10.18.23), #8 (11.27.23), #9 (01.04.24)  - s/p IVE OD #1 (07.17.23), #2 (08.15.23), #3 (09.13.23), #4  (10.18.23), #5 (11.27.23), #6 (01.04.24) - BCVA 20/20 OU - stable  -OCT shows OD: Persistent IRF/cystic changes nasal macula / peripapillary, stable improvement in focal SRF superior to disc; OS: Persistent peripapillary IRF nasal macula; SRF stably improved at 6 weeks  - repeat FA (08.23.22) shows mild peripapillary staining and ?leakage OU -- ?CNV  - unclear etiology, but suspect ARMD vs idiopathic CNV; post-COVID vaccine? -- pt received 2nd booster end of Sept 2022  - recommend IVE OU (OD #7 and OS #10) today, 02.15.24 with follow up in 5-6 weeks  - pt wishes to proceed with injection  - RBA of procedure discussed, questions answered - IVA informed consent obtained and signed, 11.15.22 (OS) - IVE informed consent obtained and signed, 07.17.23 (OU) - see procedure note   - Amsler grid given to closely monitor for vision changes  - f/u 5-6 weeks, DFE, OCT, possible injections  2,3. Hypertensive retinopathy OU - discussed importance of tight BP control - continue to monitor  4. Pseudophakia OU  - s/p CE/IOL (Dr. Talbert Forest, 2022)  - IOLs in good position, doing well - continue to monitor  Ophthalmic Meds Ordered this visit:  Meds ordered this encounter  Medications   aflibercept (EYLEA) SOLN 2 mg   aflibercept (EYLEA) SOLN 2 mg     Return for f/u 5-6 weeks, exu ARMD OU, DFE, OCT.  There are no Patient Instructions on file for this visit.  Explained the diagnoses, plan, and follow up with the patient and they expressed understanding.  Patient expressed understanding of the importance of proper follow up care.   This document serves as a record of services personally performed by Gardiner Sleeper, MD, PhD. It was created on their behalf by San Jetty. Owens Shark, OA an ophthalmic technician. The creation of this record is the provider's dictation and/or activities during the visit.    Electronically signed by: San Jetty. Owens Shark, [place] 02.01.2024 12:06 PM  Gardiner Sleeper, M.D., Ph.D. Diseases &  Surgery of the Retina and Vitreous [facility]  I have reviewed the above documentation for accuracy and completeness, and I agree with the above. Gardiner Sleeper, M.D., Ph.D. 01/27/23 12:08 PM  Abbreviations: M myopia (nearsighted); A astigmatism; H hyperopia (farsighted); P presbyopia; Mrx spectacle prescription;  CTL contact lenses; OD right eye; OS left eye; OU both eyes  XT exotropia; ET esotropia; PEK punctate epithelial keratitis; PEE punctate epithelial erosions; DES dry eye syndrome; MGD meibomian gland dysfunction; ATs artificial tears; PFAT's preservative free artificial tears; [place] nuclear sclerotic cataract; PSC posterior subcapsular cataract; ERM epi-retinal membrane; PVD posterior vitreous detachment; RD retinal detachment; DM diabetes mellitus; DR diabetic retinopathy; NPDR non-proliferative diabetic retinopathy; PDR proliferative diabetic retinopathy; CSME clinically significant macular edema; DME diabetic macular edema; dbh dot blot hemorrhages; CWS cotton wool spot; POAG primary open angle glaucoma; C/D cup-to-disc ratio; HVF humphrey visual field; GVF goldmann visual field; OCT optical coherence tomography; IOP intraocular pressure; BRVO Branch retinal vein occlusion; CRVO central retinal vein occlusion; CRAO central retinal artery occlusion; BRAO branch retinal artery occlusion; RT retinal tear; SB scleral buckle; PPV pars plana vitrectomy; VH Vitreous hemorrhage; PRP panretinal laser photocoagulation; IVK intravitreal kenalog; VMT vitreomacular traction; MH  Macular hole;  NVD neovascularization of the disc; NVE neovascularization elsewhere; AREDS age related eye disease study; ARMD age related macular degeneration; POAG primary open angle glaucoma; EBMD epithelial/anterior basement membrane dystrophy; ACIOL anterior chamber intraocular lens; IOL intraocular lens; PCIOL posterior chamber intraocular lens; Phaco/IOL phacoemulsification with intraocular lens placement;  [place] photorefractive keratectomy; LASIK laser assisted in situ keratomileusis; HTN hypertension; DM diabetes mellitus; COPD chronic obstructive pulmonary disease

## 2023-01-27 ENCOUNTER — Encounter (INDEPENDENT_AMBULATORY_CARE_PROVIDER_SITE_OTHER): Payer: Self-pay | Admitting: Ophthalmology

## 2023-01-27 ENCOUNTER — Ambulatory Visit (INDEPENDENT_AMBULATORY_CARE_PROVIDER_SITE_OTHER): Payer: PPO | Admitting: Ophthalmology

## 2023-01-27 DIAGNOSIS — H35033 Hypertensive retinopathy, bilateral: Secondary | ICD-10-CM

## 2023-01-27 DIAGNOSIS — Z961 Presence of intraocular lens: Secondary | ICD-10-CM | POA: Diagnosis not present

## 2023-01-27 DIAGNOSIS — I10 Essential (primary) hypertension: Secondary | ICD-10-CM | POA: Diagnosis not present

## 2023-01-27 DIAGNOSIS — H353231 Exudative age-related macular degeneration, bilateral, with active choroidal neovascularization: Secondary | ICD-10-CM | POA: Diagnosis not present

## 2023-01-27 MED ORDER — AFLIBERCEPT 2MG/0.05ML IZ SOLN FOR KALEIDOSCOPE
2.0000 mg | INTRAVITREAL | Status: AC | PRN
  Administered 2023-01-27: 2 mg via INTRAVITREAL

## 2023-02-15 ENCOUNTER — Other Ambulatory Visit (INDEPENDENT_AMBULATORY_CARE_PROVIDER_SITE_OTHER): Payer: Self-pay

## 2023-02-17 NOTE — Progress Notes (Signed)
[facility] Note  03/03/2023    CHIEF COMPLAINT Patient presents for Retina Follow Up  HISTORY OF PRESENT ILLNESS: Raymond Alexander is a 77 y.o. male who presents to the clinic today for:   HPI     Retina Follow Up   Patient presents with  Wet AMD.  In both eyes.  This started 5 weeks ago.  I, the attending physician,  performed the HPI with the patient and updated documentation appropriately.        Comments   Patient here for 5 weeks retina follow up for exu ARMD OU. Patient states vision doing ok. No eye pain. On occasion sees a green flash in OD.      Last edited by Bernarda Caffey, MD on 03/03/2023  9:41 AM.    Pt states he has not noticed any changes in vision, he has an occasional green fol in his right eye   Referring physician: Lujean Amel, MD [address],  [place] [id number]  HISTORICAL INFORMATION:   Selected notes from the [facility] Referred by Dr. Nicki Reaper for concern of retinal edema OU   CURRENT MEDICATIONS: No current outpatient medications on file. (Ophthalmic Drugs)   No current facility-administered medications for this visit. (Ophthalmic Drugs)   Current Outpatient Medications (Other)  Medication Sig   aspirin 81 MG tablet Take 81 mg by mouth daily.   atorvastatin (LIPITOR) 80 MG tablet Take 80 mg by mouth daily.   Coenzyme Q10 (CO Q-10) 100 MG CAPS Take 300 mg by mouth daily.   ezetimibe (ZETIA) 10 MG tablet Take 10 mg by mouth daily.   fexofenadine (ALLEGRA) 180 MG tablet Take 180 mg by mouth daily as needed for allergies.    fluticasone (FLONASE) 50 MCG/ACT nasal spray Place 1 spray into both nostrils daily.   IBUPROFEN & ACETAMINOPHEN PO Take by mouth as needed.   lisinopril (PRINIVIL,ZESTRIL) 10 MG tablet Take 10 mg by mouth daily.   Mag Aspart-Potassium Aspart (POTASSIUM & MAGNESIUM ASPARTAT) 250-250 MG CAPS Take 1 tablet by mouth daily.   Multiple Vitamin (MULTIVITAMIN) tablet  Take 1 tablet by mouth daily.   nitroGLYCERIN (NITROSTAT) 0.4 MG SL tablet Place 0.4 mg under the tongue every 5 (five) minutes as needed for chest pain.   Omega-3 Fatty Acids (FISH OIL) 1000 MG CAPS Take 2 capsules by mouth daily.   OVER THE COUNTER MEDICATION Take 1 tablet by mouth daily. Med Name: PROTANDIM   sildenafil (VIAGRA) 50 MG tablet Take 50 mg by mouth daily as needed for erectile dysfunction.   No current facility-administered medications for this visit. (Other)   REVIEW OF SYSTEMS: ROS   Positive for: Cardiovascular, Eyes Negative for: Constitutional, Gastrointestinal, Neurological, Skin, Genitourinary, Musculoskeletal, HENT, Endocrine, Respiratory, Psychiatric, Allergic/Imm, Heme/Lymph Last edited by Theodore Demark, COA on 03/03/2023  7:55 AM.      ALLERGIES No Known Allergies  PAST MEDICAL HISTORY Past Medical History:  Diagnosis Date   ASCVD (arteriosclerotic cardiovascular disease)    3 vessel   Clotting disorder ([place])    Esophageal reflux    Hyperlipidemia    Hypertension    Macular degeneration    Past Surgical History:  Procedure Laterality Date   CATARACT EXTRACTION     PTCA  10/14/2003   mid RCA   FAMILY HISTORY Family History  Problem Relation Age of Onset   Heart attack Father    Cancer Mother    Diabetes Sister  Cancer Brother    Healthy Brother    SOCIAL HISTORY Social History   Tobacco Use   Smoking status: Former   Smokeless tobacco: Never  Scientific laboratory technician Use: Never used  Substance Use Topics   Alcohol use: Yes    Alcohol/week: 1.0 standard drink of alcohol    Types: 1 Glasses of wine per week    Comment: 1-2 per day   Drug use: No       OPHTHALMIC EXAM:  Base Eye Exam     Visual Acuity (Snellen - Linear)       Right Left   Dist cc 20/20 20/20    Correction: Glasses         Tonometry (Tonopen, 7:53 AM)       Right Left   Pressure 14 10         Pupils       Dark Light Shape React APD   Right 3  2 Round Brisk None   Left 3 2 Round Brisk None         Visual Fields (Counting fingers)       Left Right    Full Full         Extraocular Movement       Right Left    Full, Ortho Full, Ortho         Neuro/Psych     Oriented x3: Yes   Mood/Affect: Normal         Dilation     Both eyes: 1.0% Mydriacyl, 2.5% Phenylephrine @ 7:53 AM           Slit Lamp and Fundus Exam     Slit Lamp Exam       Right Left   Lids/Lashes Normal small stye nasal LL margin   Conjunctiva/Sclera White and quiet White and quiet   Cornea arcus, well healed cataract wound, trace PEE arcus, well healed cataract wound, trace PEE   Anterior Chamber Deep and quiet Deep and quiet   Iris Round and dilated Round and dilated   Lens PC IOL in good position Toric PC IOL in good position with marks at 0530 and 1100   Anterior Vitreous Mild Vitreous syneresis, no cell or pigment, low lying Posterior vitreous detachment, Weiss ring Mild Vitreous syneresis, no cell or pigment, Posterior vitreous detachment         Fundus Exam       Right Left   Disc Pink and Sharp, mild peripapillary edema - persistent ?peripapillary CNV ST to disc -- persistent, no heme Pink and Sharp, peripapillary edema -- persistent, CNV ST disc, no heme   C/D Ratio 0.5 0.4   Macula Good foveal reflex, nasal cystic changes/edema - persistent, fine drusen vs exudate - improved, no heme Good foveal reflex, mild edema nasal macula -- persistent, no heme   Vessels mild attenuation, mild tortuosity mild attenuation, mild Copper wiring, mild AV crossing changes   Periphery Attached, No heme, no RT/RD Attached, No heme            Refraction     Wearing Rx       Sphere Cylinder Axis Add   Right [place] +0.25 060 +2.25   Left +0.25 Sphere  +2.25           IMAGING AND PROCEDURES  Imaging and Procedures for 03/03/2023  OCT, Retina - OU - Both Eyes       Right Eye Quality was good. Central Foveal Thickness: 285.  Progression has worsened. Findings include normal foveal contour, no SRF, retinal drusen , intraretinal fluid, pigment epithelial detachment (Persistent IRF/cystic changes nasal macula / peripapillary -- slightly increased, stable improvement in focal SRF superior to disc).   Left Eye Quality was good. Central Foveal Thickness: 270. Progression has worsened. Findings include normal foveal contour, no SRF, retinal drusen , intraretinal fluid (Persistent peripapillary IRF nasal macula -- slightly increased; SRF stably improved).   Notes *Images captured and stored on drive  Diagnosis / Impression:  OD: Persistent IRF/cystic changes nasal macula / peripapillary -- slightly increased, stable improvement in focal SRF superior to disc OS: Persistent peripapillary IRF nasal macula -- slightly increased; SRF stably resolved  Clinical management:  See below  Abbreviations: NFP - Normal foveal profile. CME - cystoid macular edema. PED - pigment epithelial detachment. IRF - intraretinal fluid. SRF - subretinal fluid. EZ - ellipsoid zone. ERM - epiretinal membrane. ORA - outer retinal atrophy. ORT - outer retinal tubulation. SRHM - subretinal hyper-reflective material. IRHM - intraretinal hyper-reflective material      Intravitreal Injection, Pharmacologic Agent - OD - Right Eye       Time Out 03/03/2023. 8:19 AM. Confirmed correct patient, procedure, site, and patient consented.   Anesthesia Topical anesthesia was used. Anesthetic medications included Lidocaine 2%, Proparacaine 0.5%.   Procedure Preparation included 5% betadine to ocular surface, eyelid speculum. A (32g) needle was used.   Injection: 2 mg aflibercept 2 MG/0.05ML   Route: Intravitreal, Site: Right Eye   NDC: A3590391, Lot: [id number], Expiration date: 10/13/2023, Waste: 0 mL   Post-op Post injection exam found visual acuity of at least counting fingers. The patient tolerated the procedure well. There were no  complications. The patient received written and verbal post procedure care education. Post injection medications were not given.      Intravitreal Injection, Pharmacologic Agent - OS - Left Eye       Time Out 03/03/2023. 8:20 AM. Confirmed correct patient, procedure, site, and patient consented.   Anesthesia Topical anesthesia was used. Anesthetic medications included Lidocaine 2%, Proparacaine 0.5%.   Procedure Preparation included 5% betadine to ocular surface, eyelid speculum. A (32g) needle was used.   Injection: 2 mg aflibercept 2 MG/0.05ML   Route: Intravitreal, Site: Left Eye   NDC: A3590391, Lot: [id number], Expiration date: 04/11/2024, Waste: 0 mL   Post-op Post injection exam found visual acuity of at least counting fingers. The patient tolerated the procedure well. There were no complications. The patient received written and verbal post procedure care education. Post injection medications were not given.             ASSESSMENT/PLAN:    ICD-10-CM   1. 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 Essential hypertension  I10     3. Hypertensive retinopathy of both eyes  H35.033     4. Pseudophakia, both eyes  Z96.1     5. Visual disturbances  H53.9      1. Peripapillary CNV / ex ARMD OU  - 8.1.22 pt with 1 wk history of decreased vision OS and isolated light green flashes OD -- flashes OD resolved, blurriness OS improving  - 10.04.22  small recurrent episode of flashes OD last week.   - 11.15.22 subjectively blurred vision OS             - s/p IVA OS #1 (11.15.22), #2 (12.13.22), #3 (01.10.23), #4 (02.09.23) -- IVA resistance  - s/p IVE OS #1 (04.18.23), #2  (05.16.23), #3 (06.13.23), #4 (07.17.23), #5 (08.15.23), #6 (09.13.23), #7 (10.18.23), #8 (11.27.23), #9 (01.04.24), #10 (02.15.24)  - s/p IVE OD #1 (07.17.23), #2 (08.15.23), #3 (09.13.23), #4 (10.18.23), #5 (11.27.23), #6 (01.04.24), #7 (02.15.24) - BCVA 20/20 OU - stable  - OCT shows OD: Persistent IRF/cystic changes nasal macula / peripapillary -- slightly increased, stable improvement in focal SRF superior to disc; OS: Persistent peripapillary IRF nasal macula -- slightly increased; SRF stably improved at 5 weeks  - repeat FA (08.23.22) shows mild peripapillary staining and ?leakage OU -- ?CNV  - unclear etiology, but suspect ARMD vs idiopathic CNV  - recommend IVE OU (OD #8 and OS #11) today, 03.21.24 with follow up in 5 weeks  - pt wishes to proceed with injection  - RBA of procedure discussed, questions answered - IVA informed consent obtained and signed, 11.15.22 (OS) - IVE informed consent obtained and signed, 07.17.23 (OU) - see procedure note   - Amsler grid given to closely monitor for vision changes  - f/u 5 weeks, DFE, OCT, possible injections  2,3. Hypertensive retinopathy OU - discussed importance of tight BP control - continue to monitor  4. Pseudophakia OU  - s/p CE/IOL (Dr. Talbert Forest, 2022)  - IOLs in good position, doing well - continue to monitor  Ophthalmic Meds Ordered this visit:  Meds ordered this encounter  Medications   aflibercept (EYLEA) SOLN 2 mg   aflibercept (EYLEA) SOLN 2 mg     Return in about 5 weeks (around 04/07/2023) for f/u exu ARMD OU, DFE, OCT.  There are no Patient Instructions on file for this visit.  Explained the diagnoses, plan, and follow up with the patient and they expressed understanding.  Patient expressed understanding of the importance of proper follow up care.   This document serves as a record of services personally performed by Gardiner Sleeper, MD, PhD. It was created on their behalf by San Jetty. Owens Shark, OA an ophthalmic  technician. The creation of this record is the provider's dictation and/or activities during the visit.    Electronically signed by: San Jetty. Owens Shark, [place] 03.07.2024 9:47 AM   Gardiner Sleeper, M.D., Ph.D. Diseases & Surgery of the Retina and Vitreous [facility]  I have reviewed the above documentation for accuracy and completeness, and I agree with the above. Gardiner Sleeper, M.D., Ph.D. 03/03/23 9:47 AM   Abbreviations: M myopia (nearsighted); A astigmatism; H hyperopia (farsighted); P presbyopia; Mrx spectacle prescription;  CTL contact lenses; OD right eye; OS left eye; OU both eyes  XT exotropia; ET esotropia; PEK punctate epithelial keratitis; PEE punctate epithelial erosions; DES dry eye syndrome; MGD meibomian gland dysfunction; ATs artificial tears; PFAT's preservative free artificial tears; [place] nuclear sclerotic cataract; PSC posterior subcapsular cataract; ERM epi-retinal membrane; PVD posterior vitreous detachment; RD retinal detachment; DM diabetes mellitus; DR diabetic retinopathy; NPDR non-proliferative diabetic retinopathy; PDR proliferative diabetic retinopathy; CSME clinically significant macular edema; DME diabetic macular edema; dbh dot blot hemorrhages; CWS cotton wool spot; POAG primary open angle glaucoma; C/D cup-to-disc ratio; HVF humphrey visual field; GVF goldmann visual field; OCT optical coherence tomography; IOP intraocular pressure; BRVO Branch retinal vein occlusion; CRVO central retinal vein  occlusion; CRAO central retinal artery occlusion; BRAO branch retinal artery occlusion; RT retinal tear; SB scleral buckle; PPV pars plana vitrectomy; VH Vitreous hemorrhage; PRP panretinal laser photocoagulation; IVK intravitreal kenalog; VMT vitreomacular traction; MH Macular hole;  NVD neovascularization of the disc; NVE neovascularization elsewhere; AREDS age related eye disease study; ARMD age related macular degeneration; POAG primary open angle glaucoma;  EBMD epithelial/anterior basement membrane dystrophy; ACIOL anterior chamber intraocular lens; IOL intraocular lens; PCIOL posterior chamber intraocular lens; Phaco/IOL phacoemulsification with intraocular lens placement; [place] photorefractive keratectomy; LASIK laser assisted in situ keratomileusis; HTN hypertension; DM diabetes mellitus; COPD chronic obstructive pulmonary disease

## 2023-03-03 ENCOUNTER — Encounter (INDEPENDENT_AMBULATORY_CARE_PROVIDER_SITE_OTHER): Payer: Self-pay | Admitting: Ophthalmology

## 2023-03-03 ENCOUNTER — Ambulatory Visit (INDEPENDENT_AMBULATORY_CARE_PROVIDER_SITE_OTHER): Payer: PPO | Admitting: Ophthalmology

## 2023-03-03 DIAGNOSIS — H35033 Hypertensive retinopathy, bilateral: Secondary | ICD-10-CM | POA: Diagnosis not present

## 2023-03-03 DIAGNOSIS — H539 Unspecified visual disturbance: Secondary | ICD-10-CM

## 2023-03-03 DIAGNOSIS — H353231 Exudative age-related macular degeneration, bilateral, with active choroidal neovascularization: Secondary | ICD-10-CM

## 2023-03-03 DIAGNOSIS — I10 Essential (primary) hypertension: Secondary | ICD-10-CM | POA: Diagnosis not present

## 2023-03-03 DIAGNOSIS — Z961 Presence of intraocular lens: Secondary | ICD-10-CM | POA: Diagnosis not present

## 2023-03-03 MED ORDER — AFLIBERCEPT 2MG/0.05ML IZ SOLN FOR KALEIDOSCOPE
2.0000 mg | INTRAVITREAL | Status: AC | PRN
  Administered 2023-03-03: 2 mg via INTRAVITREAL

## 2023-03-17 ENCOUNTER — Other Ambulatory Visit (INDEPENDENT_AMBULATORY_CARE_PROVIDER_SITE_OTHER): Payer: Self-pay

## 2023-03-17 DIAGNOSIS — H353231 Exudative age-related macular degeneration, bilateral, with active choroidal neovascularization: Secondary | ICD-10-CM

## 2023-03-17 DIAGNOSIS — Z961 Presence of intraocular lens: Secondary | ICD-10-CM

## 2023-03-17 DIAGNOSIS — I10 Essential (primary) hypertension: Secondary | ICD-10-CM

## 2023-03-17 DIAGNOSIS — H35033 Hypertensive retinopathy, bilateral: Secondary | ICD-10-CM

## 2023-03-17 NOTE — Progress Notes (Incomplete)
[facility] Note  03/17/2023    CHIEF COMPLAINT Patient presents for No chief complaint on file.  HISTORY OF PRESENT ILLNESS: Raymond Alexander is a 77 y.o. male who presents to the clinic today for:    Pt states he has not noticed any changes in vision, he has an occasional green fol in his right eye   Referring physician: No referring provider defined for this encounter.  HISTORICAL INFORMATION:   Selected notes from the MEDICAL RECORD NUMBER Referred by Dr. Nicki Reaper for concern of retinal edema OU   CURRENT MEDICATIONS: No current outpatient medications on file. (Ophthalmic Drugs)   No current facility-administered medications for this visit. (Ophthalmic Drugs)   Current Outpatient Medications (Other)  Medication Sig   aspirin 81 MG tablet Take 81 mg by mouth daily.   atorvastatin (LIPITOR) 80 MG tablet Take 80 mg by mouth daily.   Coenzyme Q10 (CO Q-10) 100 MG CAPS Take 300 mg by mouth daily.   ezetimibe (ZETIA) 10 MG tablet Take 10 mg by mouth daily.   fexofenadine (ALLEGRA) 180 MG tablet Take 180 mg by mouth daily as needed for allergies.    fluticasone (FLONASE) 50 MCG/ACT nasal spray Place 1 spray into both nostrils daily.   IBUPROFEN & ACETAMINOPHEN PO Take by mouth as needed.   lisinopril (PRINIVIL,ZESTRIL) 10 MG tablet Take 10 mg by mouth daily.   Mag Aspart-Potassium Aspart (POTASSIUM & MAGNESIUM ASPARTAT) 250-250 MG CAPS Take 1 tablet by mouth daily.   Multiple Vitamin (MULTIVITAMIN) tablet Take 1 tablet by mouth daily.   nitroGLYCERIN (NITROSTAT) 0.4 MG SL tablet Place 0.4 mg under the tongue every 5 (five) minutes as needed for chest pain.   Omega-3 Fatty Acids (FISH OIL) 1000 MG CAPS Take 2 capsules by mouth daily.   OVER THE COUNTER MEDICATION Take 1 tablet by mouth daily. Med Name: PROTANDIM   sildenafil (VIAGRA) 50 MG tablet Take 50 mg by mouth daily as needed for erectile dysfunction.   No current facility-administered medications  for this visit. (Other)   REVIEW OF SYSTEMS:    ALLERGIES No Known Allergies  PAST MEDICAL HISTORY Past Medical History:  Diagnosis Date   ASCVD (arteriosclerotic cardiovascular disease)    3 vessel   Clotting disorder (HCC)    Esophageal reflux    Hyperlipidemia    Hypertension    Macular degeneration    Past Surgical History:  Procedure Laterality Date   CATARACT EXTRACTION     PTCA  10/14/2003   mid RCA   FAMILY HISTORY Family History  Problem Relation Age of Onset   Heart attack Father    Cancer Mother    Diabetes Sister    Cancer Brother    Healthy Brother    SOCIAL HISTORY Social History   Tobacco Use   Smoking status: Former   Smokeless tobacco: Never  Scientific laboratory technician Use: Never used  Substance Use Topics   Alcohol use: Yes    Alcohol/week: 1.0 standard drink of alcohol    Types: 1 Glasses of wine per week    Comment: 1-2 per day   Drug use: No       OPHTHALMIC EXAM:  Not recorded    IMAGING AND PROCEDURES  Imaging and Procedures for 03/17/2023           ASSESSMENT/PLAN:  No diagnosis found.  1. Peripapillary CNV / ex ARMD OU  - 8.1.22 pt with 1 wk history of decreased vision  OS and isolated light green flashes OD -- flashes OD resolved, blurriness OS improving  - 10.04.22 small recurrent episode of flashes OD last week.   - 11.15.22 subjectively blurred vision OS             - s/p IVA OS #1 (11.15.22), #2 (12.13.22), #3 (01.10.23), #4 (02.09.23) -- IVA resistance  - s/p IVE OS #1 (04.18.23), #2 (05.16.23), #3 (06.13.23), #4 (07.17.23), #5 (08.15.23), #6 (09.13.23), #7 (10.18.23), #8 (11.27.23), #9 (01.04.24), #10 (02.15.24), #11 (03.21.24)  - s/p IVE OD #1 (07.17.23), #2 (08.15.23), #3 (09.13.23), #4 (10.18.23), #5 (11.27.23), #6 (01.04.24), #7 (02.15.24), #8 (03.21.24) - BCVA 20/20 OU - stable  - OCT shows OD: Persistent IRF/cystic changes nasal macula / peripapillary -- slightly increased, stable improvement in focal SRF  superior to disc; OS: Persistent peripapillary IRF nasal macula -- slightly increased; SRF stably improved at 5 weeks  - repeat FA (08.23.22) shows mild peripapillary staining and ?leakage OU -- ?CNV  - unclear etiology, but suspect ARMD vs idiopathic CNV  - recommend IVE OU (OD #9 and OS #12) today, 04.25.24 with follow up in 5 weeks  - pt wishes to proceed with injection  - RBA of procedure discussed, questions answered - IVA informed consent obtained and signed, 11.15.22 (OS) - IVE informed consent obtained and signed, 07.17.23 (OU) - see procedure note   - Amsler grid given to closely monitor for vision changes  - f/u 5 weeks, DFE, OCT, possible injections  2,3. Hypertensive retinopathy OU - discussed importance of tight BP control - continue to monitor  4. Pseudophakia OU  - s/p CE/IOL (Dr. Talbert Forest, 2022)  - IOLs in good position, doing well - continue to monitor  Ophthalmic Meds Ordered this visit:  No orders of the defined types were placed in this encounter.    No follow-ups on file.  There are no Patient Instructions on file for this visit.  Explained the diagnoses, plan, and follow up with the patient and they expressed understanding.  Patient expressed understanding of the importance of proper follow up care.   This document serves as a record of services personally performed by Gardiner Sleeper, MD, PhD. It was created on their behalf by San Jetty. Owens Shark, OA an ophthalmic technician. The creation of this record is the provider's dictation and/or activities during the visit.    Electronically signed by: San Jetty. Owens Shark, [place] 04.04.2024 1:35 PM   Gardiner Sleeper, M.D., Ph.D. Diseases & Surgery of the Retina and Vitreous [facility]: M myopia (nearsighted); A astigmatism; H hyperopia (farsighted); P presbyopia; Mrx spectacle prescription;  CTL contact lenses; OD right eye; OS left eye; OU both eyes  XT exotropia; ET esotropia;  PEK punctate epithelial keratitis; PEE punctate epithelial erosions; DES dry eye syndrome; MGD meibomian gland dysfunction; ATs artificial tears; PFAT's preservative free artificial tears; [place] nuclear sclerotic cataract; PSC posterior subcapsular cataract; ERM epi-retinal membrane; PVD posterior vitreous detachment; RD retinal detachment; DM diabetes mellitus; DR diabetic retinopathy; NPDR non-proliferative diabetic retinopathy; PDR proliferative diabetic retinopathy; CSME clinically significant macular edema; DME diabetic macular edema; dbh dot blot hemorrhages; CWS cotton wool spot; POAG primary open angle glaucoma; C/D cup-to-disc ratio; HVF humphrey visual field; GVF goldmann visual field; OCT optical coherence tomography; IOP intraocular pressure; BRVO Branch retinal vein occlusion; CRVO central retinal vein occlusion; CRAO central retinal artery occlusion; BRAO branch retinal artery occlusion; RT retinal tear; SB scleral buckle; PPV pars plana vitrectomy; VH  Vitreous hemorrhage; PRP panretinal laser photocoagulation; IVK intravitreal kenalog; VMT vitreomacular traction; MH Macular hole;  NVD neovascularization of the disc; NVE neovascularization elsewhere; AREDS age related eye disease study; ARMD age related macular degeneration; POAG primary open angle glaucoma; EBMD epithelial/anterior basement membrane dystrophy; ACIOL anterior chamber intraocular lens; IOL intraocular lens; PCIOL posterior chamber intraocular lens; Phaco/IOL phacoemulsification with intraocular lens placement; [place] photorefractive keratectomy; LASIK laser assisted in situ keratomileusis; HTN hypertension; DM diabetes mellitus; COPD chronic obstructive pulmonary disease

## 2023-03-25 NOTE — Progress Notes (Signed)
[facility] - Clinic Note  04/07/2023    CHIEF COMPLAINT Patient presents for Retina Follow Up  HISTORY OF PRESENT ILLNESS: Raymond Alexander is a 76 y.o. male who presents to the clinic today for:   HPI     Retina Follow Up   Patient presents with  Wet AMD.  In both eyes.  This started weeks ago.  Duration of 5 weeks.  I, the attending physician,  performed the HPI with the patient and updated documentation appropriately.        Comments   Patient is unsure if there has been any vision changes. He is using AT's OU PRN.      Last edited by Rennis Chris, MD on 04/07/2023  8:52 AM.     Pt states    Referring physician: Darrow Bussing, MD [address] [place],  [place] [address]  HISTORICAL INFORMATION:   Selected notes from the MEDICAL RECORD NUMBER Referred by Dr. Lorin Picket for concern of retinal edema OU   CURRENT MEDICATIONS: No current outpatient medications on file. (Ophthalmic Drugs)   No current facility-administered medications for this visit. (Ophthalmic Drugs)   Current Outpatient Medications (Other)  Medication Sig   aspirin 81 MG tablet Take 81 mg by mouth daily.   atorvastatin (LIPITOR) 80 MG tablet Take 80 mg by mouth daily.   Coenzyme Q10 (CO Q-10) 100 MG CAPS Take 300 mg by mouth daily.   ezetimibe (ZETIA) 10 MG tablet Take 10 mg by mouth daily.   fexofenadine (ALLEGRA) 180 MG tablet Take 180 mg by mouth daily as needed for allergies.    fluticasone (FLONASE) 50 MCG/ACT nasal spray Place 1 spray into both nostrils daily.   IBUPROFEN & ACETAMINOPHEN PO Take by mouth as needed.   lisinopril (PRINIVIL,ZESTRIL) 10 MG tablet Take 10 mg by mouth daily.   Mag Aspart-Potassium Aspart (POTASSIUM & MAGNESIUM ASPARTAT) 250-250 MG CAPS Take 1 tablet by mouth daily.   Multiple Vitamin (MULTIVITAMIN) tablet Take 1 tablet by mouth daily.   nitroGLYCERIN (NITROSTAT) 0.4 MG SL tablet Place 0.4 mg under the tongue every 5 (five) minutes  as needed for chest pain.   Omega-3 Fatty Acids (FISH OIL) 1000 MG CAPS Take 2 capsules by mouth daily.   OVER THE COUNTER MEDICATION Take 1 tablet by mouth daily. Med Name: PROTANDIM   sildenafil (VIAGRA) 50 MG tablet Take 50 mg by mouth daily as needed for erectile dysfunction.   No current facility-administered medications for this visit. (Other)   REVIEW OF SYSTEMS: ROS   Positive for: Cardiovascular, Eyes Negative for: Constitutional, Gastrointestinal, Neurological, Skin, Genitourinary, Musculoskeletal, HENT, Endocrine, Respiratory, Psychiatric, Allergic/Imm, Heme/Lymph Last edited by Julieanne Cotton, COT on 04/07/2023  7:48 AM.     ALLERGIES No Known Allergies  PAST MEDICAL HISTORY Past Medical History:  Diagnosis Date   ASCVD (arteriosclerotic cardiovascular disease)    3 vessel   Clotting disorder    Esophageal reflux    Hyperlipidemia    Hypertension    Macular degeneration    Past Surgical History:  Procedure Laterality Date   CATARACT EXTRACTION     PTCA  10/14/2003   mid RCA   FAMILY HISTORY Family History  Problem Relation Age of Onset   Heart attack Father    Cancer Mother    Diabetes Sister    Cancer Brother    Healthy Brother    SOCIAL HISTORY Social History   Tobacco Use   Smoking status: Former  Smokeless tobacco: Never  Vaping Use   Vaping Use: Never used  Substance Use Topics   Alcohol use: Yes    Alcohol/week: 1.0 standard drink of alcohol    Types: 1 Glasses of wine per week    Comment: 1-2 per day   Drug use: No       OPHTHALMIC EXAM:  Base Eye Exam     Visual Acuity (Snellen - Linear)       Right Left   Dist cc 20/20 20/20    Correction: Glasses         Tonometry (Tonopen, 7:51 AM)       Right Left   Pressure 14 12         Pupils       Dark Light Shape React APD   Right 3 2 Round Brisk None   Left 3 2 Round Brisk None         Visual Fields       Left Right    Full Full         Extraocular  Movement       Right Left    Full, Ortho Full, Ortho         Neuro/Psych     Oriented x3: Yes   Mood/Affect: Normal         Dilation     Both eyes: 1.0% Mydriacyl, 2.5% Phenylephrine @ 7:49 AM           Slit Lamp and Fundus Exam     Slit Lamp Exam       Right Left   Lids/Lashes Normal small stye nasal LL margin   Conjunctiva/Sclera White and quiet White and quiet   Cornea arcus, well healed cataract wound, trace PEE arcus, well healed cataract wound, trace PEE   Anterior Chamber Deep and quiet Deep and quiet   Iris Round and dilated Round and dilated   Lens PC IOL in good position Toric PC IOL in good position with marks at 0530 and 1100   Anterior Vitreous Mild Vitreous syneresis, no cell or pigment, low lying Posterior vitreous detachment, Weiss ring Mild Vitreous syneresis, no cell or pigment, Posterior vitreous detachment         Fundus Exam       Right Left   Disc Pink and Sharp, mild peripapillary edema - slightly improved ?peripapillary CNV ST to disc -- persistent, no heme Pink and Sharp, peripapillary edema -- improved, CNV ST disc, no heme   C/D Ratio 0.5 0.4   Macula Good foveal reflex, nasal cystic changes/edema - slightly improved, fine drusen vs exudate - improved, no heme Good foveal reflex, mild edema nasal macula -- improved, no heme   Vessels mild attenuation, mild tortuosity mild attenuation, mild Copper wiring, mild AV crossing changes   Periphery Attached, No heme, no RT/RD Attached, No heme            Refraction     Wearing Rx       Sphere Cylinder Axis Add   Right [place] +0.25 060 +2.25   Left +0.25 Sphere  +2.25           IMAGING AND PROCEDURES  Imaging and Procedures for 04/07/2023  OCT, Retina - OU - Both Eyes       Right Eye Quality was good. Central Foveal Thickness: 287. Progression has improved. Findings include normal foveal contour, no SRF, retinal drusen , intraretinal fluid, pigment epithelial detachment  (Persistent IRF/cystic changes nasal macula /  peripapillary -- slightly improved, stable improvement in focal SRF superior to disc).   Left Eye Quality was good. Central Foveal Thickness: 272. Progression has improved. Findings include normal foveal contour, no SRF, retinal drusen , intraretinal fluid (Persistent peripapillary IRF nasal macula -- slightly improved; SRF stably improved).   Notes *Images captured and stored on drive  Diagnosis / Impression:  OD: Persistent IRF/cystic changes nasal macula / peripapillary -- slightly improved, stable improvement in focal SRF superior to disc OS: Persistent peripapillary IRF nasal macula -- slightly improved; SRF stably resolved  Clinical management:  See below  Abbreviations: NFP - Normal foveal profile. CME - cystoid macular edema. PED - pigment epithelial detachment. IRF - intraretinal fluid. SRF - subretinal fluid. EZ - ellipsoid zone. ERM - epiretinal membrane. ORA - outer retinal atrophy. ORT - outer retinal tubulation. SRHM - subretinal hyper-reflective material. IRHM - intraretinal hyper-reflective material      Intravitreal Injection, Pharmacologic Agent - OD - Right Eye       Time Out 04/07/2023. 8:01 AM. Confirmed correct patient, procedure, site, and patient consented.   Anesthesia Topical anesthesia was used. Anesthetic medications included Lidocaine 2%, Proparacaine 0.5%.   Procedure Preparation included 5% betadine to ocular surface, eyelid speculum. A (32g) needle was used.   Injection: 2 mg aflibercept 2 MG/0.05ML   Route: Intravitreal, Site: Right Eye   NDC: L6038910, Lot: [id number], Expiration date: 04/11/2024, Waste: 0 mL   Post-op Post injection exam found visual acuity of at least counting fingers. The patient tolerated the procedure well. There were no complications. The patient received written and verbal post procedure care education. Post injection medications were not given.      Intravitreal  Injection, Pharmacologic Agent - OS - Left Eye       Time Out 04/07/2023. 8:02 AM. Confirmed correct patient, procedure, site, and patient consented.   Anesthesia Topical anesthesia was used. Anesthetic medications included Lidocaine 2%, Proparacaine 0.5%.   Procedure Preparation included 5% betadine to ocular surface, eyelid speculum. A (32g) needle was used.   Injection: 2 mg aflibercept 2 MG/0.05ML   Route: Intravitreal, Site: Left Eye   NDC: L6038910, Lot: [id number], Expiration date: 04/11/2024, Waste: 0 mL   Post-op Post injection exam found visual acuity of at least counting fingers. The patient tolerated the procedure well. There were no complications. The patient received written and verbal post procedure care education. Post injection medications were not given.              ASSESSMENT/PLAN:    ICD-10-CM   1. Exudative age-related macular degeneration of both eyes with active choroidal neovascularization  H35.3231 OCT, Retina - OU - Both Eyes    Intravitreal Injection, Pharmacologic Agent - OD - Right Eye    Intravitreal Injection, Pharmacologic Agent - OS - Left Eye    aflibercept (EYLEA) SOLN 2 mg    aflibercept (EYLEA) SOLN 2 mg    2. Essential hypertension  I10     3. Hypertensive retinopathy of both eyes  H35.033     4. Pseudophakia, both eyes  Z96.1     5. Visual disturbances  H53.9       1. Peripapillary CNV / ex ARMD OU  - 8.1.22 pt with 1 wk history of decreased vision OS and isolated light green flashes OD -- flashes OD resolved, blurriness OS improving  - 10.04.22 small recurrent episode of flashes OD last week.   - 11.15.22 subjectively blurred vision OS             -  s/p IVA OS #1 (11.15.22), #2 (12.13.22), #3 (01.10.23), #4 (02.09.23) -- IVA resistance  - s/p IVE OS #1 (04.18.23), #2 (05.16.23), #3 (06.13.23), #4 (07.17.23), #5 (08.15.23), #6 (09.13.23), #7 (10.18.23), #8 (11.27.23), #9 (01.04.24), #10 (02.15.24), #11 (03.21.24)  - s/p IVE  OD #1 (07.17.23), #2 (08.15.23), #3 (09.13.23), #4 (10.18.23), #5 (11.27.23), #6 (01.04.24), #7 (02.15.24), #8 (03.21.24) - BCVA 20/20 OU - stable  - OCT shows OD: Persistent IRF/cystic changes nasal macula / peripapillary -- slightly improved, stable improvement in focal SRF superior to disc; OS: Persistent peripapillary IRF nasal macula -- slightly improved; SRF stably improved at 5 weeks  - repeat FA (08.23.22) shows mild peripapillary staining and ?leakage OU -- ?CNV  - unclear etiology, but suspect ARMD vs idiopathic CNV  - recommend IVE OU (OD #9 and OS #12) today, 04.25.24 with follow up in 5 weeks  - pt wishes to proceed with injection  - RBA of procedure discussed, questions answered - IVA informed consent obtained and signed, 11.15.22 (OS) - IVE informed consent obtained and signed, 07.17.23 (OU) - see procedure note   - Amsler grid given to closely monitor for vision changes  - f/u 5 weeks, DFE, OCT, possible injections  2,3. Hypertensive retinopathy OU - discussed importance of tight BP control - continue to monitor  4. Pseudophakia OU  - s/p CE/IOL (Dr. Vonna Kotyk, 2022)  - IOLs in good position, doing well - continue to monitor  Ophthalmic Meds Ordered this visit:  Meds ordered this encounter  Medications   aflibercept (EYLEA) SOLN 2 mg   aflibercept (EYLEA) SOLN 2 mg     Return in about 5 weeks (around 05/12/2023) for f/u exu ARMD OU, DFE, OCT.  There are no Patient Instructions on file for this visit.  Explained the diagnoses, plan, and follow up with the patient and they expressed understanding.  Patient expressed understanding of the importance of proper follow up care.   This document serves as a record of services personally performed by Karie Chimera, MD, PhD. It was created on their behalf by Glee Arvin. Manson Passey, OA an ophthalmic technician. The creation of this record is the provider's dictation and/or activities during the visit.    Electronically signed by:  Glee Arvin. Manson Passey, [place] 04.04.2024 8:58 AM   Karie Chimera, M.D., Ph.D. Diseases & Surgery of the Retina and Vitreous [facility]  I have reviewed the above documentation for accuracy and completeness, and I agree with the above. Karie Chimera, M.D., Ph.D. 04/07/23 8:59 AM    Abbreviations: M myopia (nearsighted); A astigmatism; H hyperopia (farsighted); P presbyopia; Mrx spectacle prescription;  CTL contact lenses; OD right eye; OS left eye; OU both eyes  XT exotropia; ET esotropia; PEK punctate epithelial keratitis; PEE punctate epithelial erosions; DES dry eye syndrome; MGD meibomian gland dysfunction; ATs artificial tears; PFAT's preservative free artificial tears; NSC nuclear sclerotic cataract; PSC posterior subcapsular cataract; ERM epi-retinal membrane; PVD posterior vitreous detachment; RD retinal detachment; DM diabetes mellitus; DR diabetic retinopathy; NPDR non-proliferative diabetic retinopathy; PDR proliferative diabetic retinopathy; CSME clinically significant macular edema; DME diabetic macular edema; dbh dot blot hemorrhages; CWS cotton wool spot; POAG primary open angle glaucoma; C/D cup-to-disc ratio; HVF humphrey visual field; GVF goldmann visual field; OCT optical coherence tomography; IOP intraocular pressure; BRVO Branch retinal vein occlusion; CRVO central retinal vein occlusion; CRAO central retinal artery occlusion; BRAO branch retinal artery occlusion; RT retinal tear; SB scleral buckle; PPV pars plana vitrectomy; VH Vitreous hemorrhage;  PRP panretinal laser photocoagulation; IVK intravitreal kenalog; VMT vitreomacular traction; MH Macular hole;  NVD neovascularization of the disc; NVE neovascularization elsewhere; AREDS age related eye disease study; ARMD age related macular degeneration; POAG primary open angle glaucoma; EBMD epithelial/anterior basement membrane dystrophy; ACIOL anterior chamber intraocular lens; IOL intraocular lens; PCIOL posterior  chamber intraocular lens; Phaco/IOL phacoemulsification with intraocular lens placement; PRK photorefractive keratectomy; LASIK laser assisted in situ keratomileusis; HTN hypertension; DM diabetes mellitus; COPD chronic obstructive pulmonary disease

## 2023-04-07 ENCOUNTER — Ambulatory Visit (INDEPENDENT_AMBULATORY_CARE_PROVIDER_SITE_OTHER): Payer: PPO | Admitting: Ophthalmology

## 2023-04-07 ENCOUNTER — Encounter (INDEPENDENT_AMBULATORY_CARE_PROVIDER_SITE_OTHER): Payer: Self-pay | Admitting: Ophthalmology

## 2023-04-07 DIAGNOSIS — H353231 Exudative age-related macular degeneration, bilateral, with active choroidal neovascularization: Secondary | ICD-10-CM | POA: Diagnosis not present

## 2023-04-07 DIAGNOSIS — H539 Unspecified visual disturbance: Secondary | ICD-10-CM

## 2023-04-07 DIAGNOSIS — Z961 Presence of intraocular lens: Secondary | ICD-10-CM

## 2023-04-07 DIAGNOSIS — I10 Essential (primary) hypertension: Secondary | ICD-10-CM | POA: Diagnosis not present

## 2023-04-07 DIAGNOSIS — H35033 Hypertensive retinopathy, bilateral: Secondary | ICD-10-CM

## 2023-04-07 MED ORDER — AFLIBERCEPT 2MG/0.05ML IZ SOLN FOR KALEIDOSCOPE
2.0000 mg | INTRAVITREAL | Status: AC | PRN
Start: 2023-04-07 — End: 2023-04-07
  Administered 2023-04-07: 2 mg via INTRAVITREAL

## 2023-04-24 NOTE — Progress Notes (Signed)
Cardiology Office Note   Date:  04/25/2023   ID:  Raymond Alexander, DOB 1946/08/24, MRN [id number]  PCP:  Darrow Bussing, MD    No chief complaint on file.  CAD  Wt Readings from Last 3 Encounters:  04/25/23 179 lb 12.8 oz (81.6 kg)  04/22/22 178 lb 9.6 oz (81 kg)  11/05/20 179 lb 3.2 oz (81.3 kg)       History of Present Illness: Raymond Alexander is a 77 y.o. male former patient of Dr. Katrinka Blazing with a hx of CAD (mid RCA DES 2004; total occlusion of septal perforator with collaterals; 85% obtuse marginal), nonischemic exercise treadmill test 2017, hyperlipidemia , and primary hypertension.   At his visit with Dr. Katrinka Blazing in 2023 he reported: "Denies angina. Just returned from [place]. Did a lot of hiking and even hiked at high altitude without chest discomfort, dyspnea, or leg fatigue. He denies orthopnea, PND, medication side effects. "  He remains active, swimming 3-4x/week.  Biking several times a week.  [place].  Plays vigorous table tennis.  Denies : Chest pain. Dizziness. Leg edema. Nitroglycerin use. Orthopnea. Palpitations. Paroxysmal nocturnal dyspnea. Shortness of breath. Syncope.      Past Medical History:  Diagnosis Date   ASCVD (arteriosclerotic cardiovascular disease)    3 vessel   Clotting disorder (HCC)    Esophageal reflux    Hyperlipidemia    Hypertension    Macular degeneration     Past Surgical History:  Procedure Laterality Date   CATARACT EXTRACTION     PTCA  10/14/2003   mid RCA     Current Outpatient Medications  Medication Sig Dispense Refill   aspirin 81 MG tablet Take 81 mg by mouth daily.     atorvastatin (LIPITOR) 80 MG tablet Take 80 mg by mouth daily.     Coenzyme Q10 (CO Q-10) 100 MG CAPS Take 300 mg by mouth daily.     ezetimibe (ZETIA) 10 MG tablet Take 10 mg by mouth daily.     fexofenadine (ALLEGRA) 180 MG tablet Take 180 mg by mouth daily as needed for allergies.      fluticasone (FLONASE) 50  MCG/ACT nasal spray Place 1 spray into both nostrils daily. PRN     IBUPROFEN & ACETAMINOPHEN PO Take by mouth as needed.     lisinopril (PRINIVIL,ZESTRIL) 10 MG tablet Take 10 mg by mouth daily.     Mag Aspart-Potassium Aspart (POTASSIUM & MAGNESIUM ASPARTAT) 250-250 MG CAPS Take 1 tablet by mouth daily.     Multiple Vitamin (MULTIVITAMIN) tablet Take 1 tablet by mouth daily.     nitroGLYCERIN (NITROSTAT) 0.4 MG SL tablet Place 0.4 mg under the tongue every 5 (five) minutes as needed for chest pain.     Omega-3 Fatty Acids (FISH OIL) 1000 MG CAPS Take 2 capsules by mouth daily.     OVER THE COUNTER MEDICATION Take 1 tablet by mouth daily. Med Name: PROTANDIM     sildenafil (VIAGRA) 50 MG tablet Take 50 mg by mouth daily as needed for erectile dysfunction.     No current facility-administered medications for this visit.    Allergies:   Patient has no known allergies.    Social History:  The patient  reports that he has quit smoking. He has never used smokeless tobacco. He reports current alcohol use of about 1.0 standard drink of alcohol per week. He reports that he does not use drugs.   Family History:  The patient's family history includes Cancer in his brother and mother; Diabetes in his sister; Healthy in his brother; Heart attack in his father.    ROS:  Please see the history of present illness.   Otherwise, review of systems are positive for eating high fiber diet for hemorrhoids.   All other systems are reviewed and negative.    PHYSICAL EXAM: VS:  BP 128/66   Pulse (!) 48   Ht 5\' 10"  (1.778 m)   Wt 179 lb 12.8 oz (81.6 kg)   SpO2 96%   BMI 25.80 kg/m  , BMI Body mass index is 25.8 kg/m. GEN: Well nourished, well developed, in no acute distress HEENT: normal Neck: no JVD, carotid bruits, or masses Cardiac: bradycardic; no murmurs, rubs, or gallops,no edema  Respiratory:  clear to auscultation bilaterally, normal work of breathing GI: soft, nontender, nondistended, +  BS MS: no deformity or atrophy Skin: warm and dry, no rash Neuro:  Strength and sensation are intact Psych: euthymic mood, full affect   EKG:   The ekg ordered today demonstrates sinus bradycardia, no ST changes   Recent Labs: No results found for requested labs within last 365 days.   Lipid Panel No results found for: "CHOL", "TRIG", "HDL", "CHOLHDL", "VLDL", "LDLCALC", "LDLDIRECT"   Other studies Reviewed: Additional studies/ records that were reviewed today with results demonstrating: .   ASSESSMENT AND PLAN:  CAD: No angina.  Healthy lifestyle.  Exercising regularly.   Hypertension: Avoid excessive salt.  Avoid processed foods.  SL NTG refill. Hyperlipidemia: Whole food, plant-based diet.  High-fiber diet.  Labs reviewed, LDL 59.  Bradycardia: This issue has been present for several years.  No sx. PreDM:  Avoids processed sugars.  Whole food plant-based diet.  High-fiber diet.  A1C 5.9 in 12/23.   Current medicines are reviewed at length with the patient today.  The patient concerns regarding his medicines were addressed.  The following changes have been made:  No change  Labs/ tests ordered today include:  No orders of the defined types were placed in this encounter.   Recommend 150 minutes/week of aerobic exercise Low fat, low carb, high fiber diet recommended  Disposition:   FU in 1 year   Signed, Lance Muss, MD  04/25/2023 8:43 AM    [facility] [address] [place], [place], [place]  [address] Phone: [phone]; Fax: [phone]

## 2023-04-25 ENCOUNTER — Ambulatory Visit: Payer: PPO | Attending: Interventional Cardiology | Admitting: Interventional Cardiology

## 2023-04-25 ENCOUNTER — Encounter: Payer: Self-pay | Admitting: Interventional Cardiology

## 2023-04-25 VITALS — BP 128/66 | HR 48 | Ht 70.0 in | Wt 179.8 lb

## 2023-04-25 DIAGNOSIS — I10 Essential (primary) hypertension: Secondary | ICD-10-CM

## 2023-04-25 DIAGNOSIS — R001 Bradycardia, unspecified: Secondary | ICD-10-CM | POA: Diagnosis not present

## 2023-04-25 DIAGNOSIS — I2510 Atherosclerotic heart disease of native coronary artery without angina pectoris: Secondary | ICD-10-CM

## 2023-04-25 DIAGNOSIS — E7849 Other hyperlipidemia: Secondary | ICD-10-CM | POA: Diagnosis not present

## 2023-04-25 MED ORDER — NITROGLYCERIN 0.4 MG SL SUBL: 0.4000 mg | SUBLINGUAL_TABLET | SUBLINGUAL | 6 refills | Status: AC | PRN

## 2023-04-25 NOTE — Patient Instructions (Signed)
Medication Instructions:  Your physician recommends that you continue on your current medications as directed. Please refer to the Current Medication list given to you today.  *If you need a refill on your cardiac medications before your next appointment, please call your pharmacy*   Lab Work: none If you have labs (blood work) drawn today and your tests are completely normal, you will receive your results only by: MyChart Message (if you have MyChart) OR A paper copy in the mail If you have any lab test that is abnormal or we need to change your treatment, we will call you to review the results.   Testing/Procedures: none   Follow-Up: At [facility], you and your health needs are our priority.  As part of our continuing mission to provide you with exceptional heart care, we have created designated Provider Care Teams.  These Care Teams include your primary Cardiologist (physician) and Advanced Practice Providers (APPs -  Physician Assistants and Nurse Practitioners) who all work together to provide you with the care you need, when you need it.  We recommend signing up for the patient portal called "MyChart".  Sign up information is provided on this After Visit Summary.  MyChart is used to connect with patients for Virtual Visits (Telemedicine).  Patients are able to view lab/test results, encounter notes, upcoming appointments, etc.  Non-urgent messages can be sent to your provider as well.   To learn more about what you can do with MyChart, go to [email or web address].    Your next appointment:   12 month(s)  Provider:   Jayadeep Varanasi, MD     Other Instructions    

## 2023-04-25 NOTE — Addendum Note (Signed)
Addended by: Lacy Duverney R on: 04/25/2023 09:40 AM   Modules accepted: Orders

## 2023-04-28 NOTE — Progress Notes (Signed)
[facility] - Clinic Note  05/12/2023    CHIEF COMPLAINT Patient presents for Retina Follow Up  HISTORY OF PRESENT ILLNESS: Raymond Alexander is a 77 y.o. male who presents to the clinic today for:   HPI     Retina Follow Up   Patient presents with  Wet AMD.  In both eyes.  Severity is moderate.  Duration of 5 weeks.  Since onset it is stable.  I, the attending physician,  performed the HPI with the patient and updated documentation appropriately.        Comments   Pt here for 5 wk ret f/u exu AMRD OU. Pt states VA seems stable, no changes.       Last edited by Rennis Chris, MD on 05/12/2023  5:06 PM.      Referring physician: Darrow Bussing, MD [address] [place],  [place] [address]  HISTORICAL INFORMATION:   Selected notes from the MEDICAL RECORD NUMBER Referred by Dr. Lorin Picket for concern of retinal edema OU   CURRENT MEDICATIONS: No current outpatient medications on file. (Ophthalmic Drugs)   No current facility-administered medications for this visit. (Ophthalmic Drugs)   Current Outpatient Medications (Other)  Medication Sig   aspirin 81 MG tablet Take 81 mg by mouth daily.   atorvastatin (LIPITOR) 80 MG tablet Take 80 mg by mouth daily.   Coenzyme Q10 (CO Q-10) 100 MG CAPS Take 300 mg by mouth daily.   ezetimibe (ZETIA) 10 MG tablet Take 10 mg by mouth daily.   fexofenadine (ALLEGRA) 180 MG tablet Take 180 mg by mouth daily as needed for allergies.    fluticasone (FLONASE) 50 MCG/ACT nasal spray Place 1 spray into both nostrils daily. PRN   IBUPROFEN & ACETAMINOPHEN PO Take by mouth as needed.   lisinopril (PRINIVIL,ZESTRIL) 10 MG tablet Take 10 mg by mouth daily.   Mag Aspart-Potassium Aspart (POTASSIUM & MAGNESIUM ASPARTAT) 250-250 MG CAPS Take 1 tablet by mouth daily.   Multiple Vitamin (MULTIVITAMIN) tablet Take 1 tablet by mouth daily.   nitroGLYCERIN (NITROSTAT) 0.4 MG SL tablet Place 1 tablet (0.4 mg total) under the  tongue every 5 (five) minutes as needed for chest pain.   Omega-3 Fatty Acids (FISH OIL) 1000 MG CAPS Take 2 capsules by mouth daily.   OVER THE COUNTER MEDICATION Take 1 tablet by mouth daily. Med Name: PROTANDIM   sildenafil (VIAGRA) 50 MG tablet Take 50 mg by mouth daily as needed for erectile dysfunction.   No current facility-administered medications for this visit. (Other)   REVIEW OF SYSTEMS: ROS   Positive for: Cardiovascular, Eyes Negative for: Constitutional, Gastrointestinal, Neurological, Skin, Genitourinary, Musculoskeletal, HENT, Endocrine, Respiratory, Psychiatric, Allergic/Imm, Heme/Lymph Last edited by Thompson Grayer, COT on 05/12/2023  8:06 AM.      ALLERGIES No Known Allergies  PAST MEDICAL HISTORY Past Medical History:  Diagnosis Date   ASCVD (arteriosclerotic cardiovascular disease)    3 vessel   Clotting disorder (HCC)    Esophageal reflux    Hyperlipidemia    Hypertension    Macular degeneration    Past Surgical History:  Procedure Laterality Date   CATARACT EXTRACTION     PTCA  10/14/2003   mid RCA   FAMILY HISTORY Family History  Problem Relation Age of Onset   Heart attack Father    Cancer Mother    Diabetes Sister    Cancer Brother    Healthy Brother    SOCIAL HISTORY Social History  Tobacco Use   Smoking status: Former   Smokeless tobacco: Never  Vaping Use   Vaping Use: Never used  Substance Use Topics   Alcohol use: Yes    Alcohol/week: 1.0 standard drink of alcohol    Types: 1 Glasses of wine per week    Comment: 1-2 per day   Drug use: No       OPHTHALMIC EXAM:  Base Eye Exam     Visual Acuity (Snellen - Linear)       Right Left   Dist [place] 20/20 20/20         Tonometry (Tonopen, 8:10 AM)       Right Left   Pressure 14 13         Pupils       Pupils Dark Light Shape React APD   Right PERRL 3 2 Round Brisk None   Left PERRL 3 2 Round Brisk None         Visual Fields (Counting fingers)        Left Right    Full Full         Extraocular Movement       Right Left    Full, Ortho Full, Ortho         Neuro/Psych     Oriented x3: Yes   Mood/Affect: Normal         Dilation     Both eyes: 1.0% Mydriacyl, 2.5% Phenylephrine @ 8:11 AM           Slit Lamp and Fundus Exam     Slit Lamp Exam       Right Left   Lids/Lashes Normal small stye nasal LL margin   Conjunctiva/Sclera White and quiet White and quiet   Cornea arcus, well healed cataract wound, trace PEE arcus, well healed cataract wound, trace PEE   Anterior Chamber Deep and quiet Deep and quiet   Iris Round and dilated Round and dilated   Lens PC IOL in good position Toric PC IOL in good position with marks at 0530 and 1100   Anterior Vitreous Mild Vitreous syneresis, no cell or pigment, low lying Posterior vitreous detachment, Weiss ring Mild Vitreous syneresis, no cell or pigment, Posterior vitreous detachment         Fundus Exam       Right Left   Disc Pink and Sharp, mild peripapillary edema - improved ?peripapillary CNV ST to disc -- persistent, no heme Pink and Sharp, peripapillary edema -- persistent, CNV ST disc, no heme   C/D Ratio 0.5 0.4   Macula Good foveal reflex, nasal cystic changes/edema - slightly improved, fine drusen vs exudate - improved, no heme Good foveal reflex, mild edema nasal macula -- persistent, no heme   Vessels mild attenuation, mild tortuosity mild attenuation, mild Copper wiring, mild AV crossing changes   Periphery Attached, No heme, no RT/RD Attached, No heme            IMAGING AND PROCEDURES  Imaging and Procedures for 05/12/2023  OCT, Retina - OU - Both Eyes       Right Eye Quality was good. Central Foveal Thickness: 288. Progression has improved. Findings include normal foveal contour, no SRF, retinal drusen , intraretinal fluid, pigment epithelial detachment (Interval improvement in peripapillary IRF/cystic changes, stable improvement in focal SRF  superior to disc).   Left Eye Quality was good. Central Foveal Thickness: 273. Progression has been stable. Findings include normal foveal contour, no SRF, retinal drusen ,  intraretinal fluid (Persistent peripapillary IRF nasal macula; SRF stably improved).   Notes *Images captured and stored on drive  Diagnosis / Impression:  OD: Interval improvement in peripapillary IRF/cystic changes, stable improvement in focal SRF superior to disc OS: Persistent peripapillary IRF nasal macula; SRF stably resolved  Clinical management:  See below  Abbreviations: NFP - Normal foveal profile. CME - cystoid macular edema. PED - pigment epithelial detachment. IRF - intraretinal fluid. SRF - subretinal fluid. EZ - ellipsoid zone. ERM - epiretinal membrane. ORA - outer retinal atrophy. ORT - outer retinal tubulation. SRHM - subretinal hyper-reflective material. IRHM - intraretinal hyper-reflective material      Intravitreal Injection, Pharmacologic Agent - OD - Right Eye       Time Out 05/12/2023. 8:50 AM. Confirmed correct patient, procedure, site, and patient consented.   Anesthesia Topical anesthesia was used. Anesthetic medications included Lidocaine 2%, Proparacaine 0.5%.   Procedure Preparation included 5% betadine to ocular surface, eyelid speculum. A (32g) needle was used.   Injection: 2 mg aflibercept 2 MG/0.05ML   Route: Intravitreal, Site: Right Eye   NDC: L6038910, Lot: [id number], Expiration date: 04/11/2024, Waste: 0 mL   Post-op Post injection exam found visual acuity of at least counting fingers. The patient tolerated the procedure well. There were no complications. The patient received written and verbal post procedure care education. Post injection medications were not given.      Intravitreal Injection, Pharmacologic Agent - OS - Left Eye       Time Out 05/12/2023. 8:51 AM. Confirmed correct patient, procedure, site, and patient consented.   Anesthesia Topical  anesthesia was used. Anesthetic medications included Lidocaine 2%, Proparacaine 0.5%.   Procedure Preparation included 5% betadine to ocular surface, eyelid speculum. A (32g) needle was used.   Injection: 2 mg aflibercept 2 MG/0.05ML   Route: Intravitreal, Site: Left Eye   NDC: L6038910, Lot: [id number], Expiration date: 04/11/2024, Waste: 0 mL   Post-op Post injection exam found visual acuity of at least counting fingers. The patient tolerated the procedure well. There were no complications. The patient received written and verbal post procedure care education. Post injection medications were not given.            ASSESSMENT/PLAN:    ICD-10-CM   1. 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 Essential hypertension  I10     3. Hypertensive retinopathy of both eyes  H35.033     4. Pseudophakia, both eyes  Z96.1     5. Visual disturbances  H53.9       1. Peripapillary CNV / exudative ARMD OU  - 8.1.22 pt with 1 wk history of decreased vision OS and isolated light green flashes OD -- flashes OD resolved, blurriness OS improving  - 10.04.22 small recurrent episode of flashes OD last week.   - 11.15.22 subjectively blurred vision OS             - s/p IVA OS #1 (11.15.22), #2 (12.13.22), #3 (01.10.23), #4 (02.09.23) -- IVA resistance  - s/p IVE OS #1 (04.18.23), #2 (05.16.23), #3 (06.13.23), #4 (07.17.23), #5 (08.15.23), #6 (09.13.23), #7 (10.18.23), #8 (11.27.23), #9 (01.04.24), #10 (02.15.24), #11 (03.21.24), # (  04.25.24)  - s/p IVE OD #1 (07.17.23), #2 (08.15.23), #3 (09.13.23), #4 (10.18.23), #5 (11.27.23), #6 (01.04.24), #7 (02.15.24), #8 (03.21.24), #9 (04.25.24) - BCVA 20/20 OU  - stable  - OCT shows OD: Interval improvement in peripapillary IRF/cystic changes, stable improvement in focal SRF superior to disc; OS: Persistent peripapillary IRF nasal macula; SRF stably resolved at 5 weeks  - repeat FA (08.23.22) shows mild peripapillary staining and ?leakage OU -- ?CNV  - unclear etiology, but suspect ARMD vs idiopathic CNV  - recommend IVE OU (OD #10 and OS #13) today, 05.23.24 with follow up in 4-6 weeks again  - pt wishes to proceed with injection  - RBA of procedure discussed, questions answered - IVA informed consent obtained and signed, 11.15.22 (OS) - IVE informed consent obtained and signed, 07.17.23 (OU) - see procedure note   - Amsler grid given to closely monitor for vision changes  - f/u 4-6 weeks, DFE, OCT, possible injections  2,3. Hypertensive retinopathy OU - discussed importance of tight BP control - continue to monitor  4. Pseudophakia OU  - s/p CE/IOL (Dr. Vonna Kotyk, 2022)  - IOLs in good position, doing well - continue to monitor  Ophthalmic Meds Ordered this visit:  Meds ordered this encounter  Medications   aflibercept (EYLEA) SOLN 2 mg   aflibercept (EYLEA) SOLN 2 mg     Return for f/u 4-6 weeks, exu ARMD OU, DFE, OCT.  There are no Patient Instructions on file for this visit.  Explained the diagnoses, plan, and follow up with the patient and they expressed understanding.  Patient expressed understanding of the importance of proper follow up care.   This document serves as a record of services personally performed by Karie Chimera, MD, PhD. It was created on their behalf by Glee Arvin. Manson Passey, OA an ophthalmic technician. The creation of this record is the provider's dictation and/or activities during the visit.    Electronically signed by: Glee Arvin. Manson Passey, [place] 05.16.2024 5:08 PM  Karie Chimera, M.D., Ph.D. Diseases & Surgery of the Retina and Vitreous [facility]  I have reviewed the above documentation for  accuracy and completeness, and I agree with the above. Karie Chimera, M.D., Ph.D. 05/12/23 5:09 PM   Abbreviations: M myopia (nearsighted); A astigmatism; H hyperopia (farsighted); P presbyopia; Mrx spectacle prescription;  CTL contact lenses; OD right eye; OS left eye; OU both eyes  XT exotropia; ET esotropia; PEK punctate epithelial keratitis; PEE punctate epithelial erosions; DES dry eye syndrome; MGD meibomian gland dysfunction; ATs artificial tears; PFAT's preservative free artificial tears; NSC nuclear sclerotic cataract; PSC posterior subcapsular cataract; ERM epi-retinal membrane; PVD posterior vitreous detachment; RD retinal detachment; DM diabetes mellitus; DR diabetic retinopathy; NPDR non-proliferative diabetic retinopathy; PDR proliferative diabetic retinopathy; CSME clinically significant macular edema; DME diabetic macular edema; dbh dot blot hemorrhages; CWS cotton wool spot; POAG primary open angle glaucoma; C/D cup-to-disc ratio; HVF humphrey visual field; GVF goldmann visual field; OCT optical coherence tomography; IOP intraocular pressure; BRVO Branch retinal vein occlusion; CRVO central retinal vein occlusion; CRAO central retinal artery occlusion; BRAO branch retinal artery occlusion; RT retinal tear; SB scleral buckle; PPV pars plana vitrectomy; VH Vitreous hemorrhage; PRP panretinal laser photocoagulation; IVK intravitreal kenalog; VMT vitreomacular traction; MH Macular hole;  NVD neovascularization of the disc; NVE neovascularization elsewhere; AREDS age related eye disease study; ARMD age related macular degeneration; POAG primary open angle glaucoma; EBMD epithelial/anterior basement membrane dystrophy; ACIOL anterior chamber intraocular lens; IOL intraocular  lens; PCIOL posterior chamber intraocular lens; Phaco/IOL phacoemulsification with intraocular lens placement; PRK photorefractive keratectomy; LASIK laser assisted in situ keratomileusis; HTN hypertension; DM diabetes mellitus;  COPD chronic obstructive pulmonary disease

## 2023-05-12 ENCOUNTER — Encounter (INDEPENDENT_AMBULATORY_CARE_PROVIDER_SITE_OTHER): Payer: Self-pay | Admitting: Ophthalmology

## 2023-05-12 ENCOUNTER — Ambulatory Visit (INDEPENDENT_AMBULATORY_CARE_PROVIDER_SITE_OTHER): Payer: PPO | Admitting: Ophthalmology

## 2023-05-12 DIAGNOSIS — H539 Unspecified visual disturbance: Secondary | ICD-10-CM | POA: Diagnosis not present

## 2023-05-12 DIAGNOSIS — I10 Essential (primary) hypertension: Secondary | ICD-10-CM | POA: Diagnosis not present

## 2023-05-12 DIAGNOSIS — H353231 Exudative age-related macular degeneration, bilateral, with active choroidal neovascularization: Secondary | ICD-10-CM | POA: Diagnosis not present

## 2023-05-12 DIAGNOSIS — H35033 Hypertensive retinopathy, bilateral: Secondary | ICD-10-CM | POA: Diagnosis not present

## 2023-05-12 DIAGNOSIS — Z961 Presence of intraocular lens: Secondary | ICD-10-CM | POA: Diagnosis not present

## 2023-05-12 MED ORDER — AFLIBERCEPT 2MG/0.05ML IZ SOLN FOR KALEIDOSCOPE
2.0000 mg | INTRAVITREAL | Status: AC | PRN
Start: 2023-05-12 — End: 2023-05-12
  Administered 2023-05-12: 2 mg via INTRAVITREAL

## 2023-05-17 NOTE — Progress Notes (Unsigned)
[facility] [address] [place] [address] Phone: [phone] Subjective:    I'm seeing this patient by the request  of:  Koirala, Dibas, MD  CC: Neck and shoulder pain follow-up  [id number]  KOLTEN PAPPA is a 77 y.o. male coming in with complaint of neck and shoulder pain. Patient states that his pain occurs while playing ping pong. Plays 2 hours 2x a week. Pain in anterior and superior aspect of joint. Pain subsides by the next day. Pain radiates up into his neck. Also swims regularly and does not have pain. Was seeing PT prior to [facility] but dry needling and ROLFing have not been helping. Was told that he has an issue with the first rib. Uses heat for pain relief.        Past Medical History:  Diagnosis Date   ASCVD (arteriosclerotic cardiovascular disease)    3 vessel   Clotting disorder (HCC)    Esophageal reflux    Hyperlipidemia    Hypertension    Macular degeneration    Past Surgical History:  Procedure Laterality Date   CATARACT EXTRACTION     PTCA  10/14/2003   mid RCA   Social History   Socioeconomic History   Marital status: Married    Spouse name: Not on file   Number of children: Not on file   Years of education: Not on file   Highest education level: Not on file  Occupational History   Not on file  Tobacco Use   Smoking status: Former   Smokeless tobacco: Never  Vaping Use   Vaping Use: Never used  Substance and Sexual Activity   Alcohol use: Yes    Alcohol/week: 1.0 standard drink of alcohol    Types: 1 Glasses of wine per week    Comment: 1-2 per day   Drug use: No   Sexual activity: Yes  Other Topics Concern   Not on file  Social History Narrative   Not on file   Social Determinants of Health   Financial Resource Strain: Not on file  Food Insecurity: Not on file  Transportation Needs: Not on file  Physical Activity: Not on file  Stress: Not on file  Social Connections: Not on file    No Known Allergies Family History  Problem Relation Age of Onset   Heart attack Father    Cancer Mother    Diabetes Sister    Cancer Brother    Healthy Brother      Current Outpatient Medications (Cardiovascular):    atorvastatin (LIPITOR) 80 MG tablet, Take 80 mg by mouth daily.   ezetimibe (ZETIA) 10 MG tablet, Take 10 mg by mouth daily.   lisinopril (PRINIVIL,ZESTRIL) 10 MG tablet, Take 10 mg by mouth daily.   nitroGLYCERIN (NITROSTAT) 0.4 MG SL tablet, Place 1 tablet (0.4 mg total) under the tongue every 5 (five) minutes as needed for chest pain.   sildenafil (VIAGRA) 50 MG tablet, Take 50 mg by mouth daily as needed for erectile dysfunction.  Current Outpatient Medications (Respiratory):    fexofenadine (ALLEGRA) 180 MG tablet, Take 180 mg by mouth daily as needed for allergies.    fluticasone (FLONASE) 50 MCG/ACT nasal spray, Place 1 spray into both nostrils daily. PRN  Current Outpatient Medications (Analgesics):    aspirin 81 MG tablet, Take 81 mg by mouth daily.   IBUPROFEN & ACETAMINOPHEN PO, Take by mouth as needed.   Current Outpatient Medications (Other):    Coenzyme  Q10 (CO Q-10) 100 MG CAPS, Take 300 mg by mouth daily.   Mag Aspart-Potassium Aspart (POTASSIUM & MAGNESIUM ASPARTAT) 250-250 MG CAPS, Take 1 tablet by mouth daily.   Multiple Vitamin (MULTIVITAMIN) tablet, Take 1 tablet by mouth daily.   Omega-3 Fatty Acids (FISH OIL) 1000 MG CAPS, Take 2 capsules by mouth daily.   OVER THE COUNTER MEDICATION, Take 1 tablet by mouth daily. Med Name: PROTANDIM   Reviewed prior external information including notes and imaging from  primary care provider As well as notes that were available from care everywhere and other healthcare systems.  Past medical history, social, surgical and family history all reviewed in electronic medical record.  No pertanent information unless stated regarding to the chief complaint.   Review of Systems:  No headache, visual changes,  nausea, vomiting, diarrhea, constipation, dizziness, abdominal pain, skin rash, fevers, chills, night sweats, weight loss, swollen lymph nodes, body aches, joint swelling, chest pain, shortness of breath, mood changes. POSITIVE muscle aches  Objective  Blood pressure 102/62, pulse (!) 55, height 5\' 10"  (1.778 m), weight 181 lb (82.1 kg), SpO2 95 %.   General: No apparent distress alert and oriented x3 mood and affect normal, dressed appropriately.  HEENT: Pupils equal, extraocular movements intact  Respiratory: Patient's speak in full sentences and does not appear short of breath  Cardiovascular: No lower extremity edema, non tender, no erythema  Neck exam shows does have some loss of lordosis noted. Shoulder exam shows right shoulder does have tenderness to palpation of the acromioclavicular joint.  Positive crossover noted.  5 out of 5 strength of the rotator cuff  Limited muscular skeletal ultrasound was performed and interpreted by Antoine Primas, M  Limited ultrasound shows the rotator cuff is intact.  Patient does have significant arthritic changes of the acromioclavicular joint with hypoechoic changes consistent with an effusion. Impression: Acromioclavicular arthritis with effusion  Procedure: Real-time Ultrasound Guided Injection of right acromioclavicular joint Device: GE Logiq Q7 Ultrasound guided injection is preferred based studies that show increased duration, increased effect, greater accuracy, decreased procedural pain, increased response rate, and decreased cost with ultrasound guided versus blind injection.  Verbal informed consent obtained.  Time-out conducted.  Noted no overlying erythema, induration, or other signs of local infection.  Skin prepped in a sterile fashion.  Local anesthesia: Topical Ethyl chloride.  With sterile technique and under real time ultrasound guidance: With a 25-gauge half inch needle injected with 0.5 cc of 0.5% Marcaine and 0.5 cc of Kenalog 40  mg/mL Completed without difficulty  Pain immediately resolved suggesting accurate placement of the medication.  Advised to call if fevers/chills, erythema, induration, drainage, or persistent bleeding.  Impression: Technically successful ultrasound guided injection.    Impression and Recommendations:    The above documentation has been reviewed and is accurate and complete Judi Saa, DO

## 2023-05-18 ENCOUNTER — Encounter: Payer: Self-pay | Admitting: Family Medicine

## 2023-05-18 ENCOUNTER — Ambulatory Visit (INDEPENDENT_AMBULATORY_CARE_PROVIDER_SITE_OTHER): Payer: PPO | Admitting: Family Medicine

## 2023-05-18 ENCOUNTER — Other Ambulatory Visit: Payer: Self-pay

## 2023-05-18 VITALS — BP 102/62 | HR 55 | Ht 70.0 in | Wt 181.0 lb

## 2023-05-18 DIAGNOSIS — M19011 Primary osteoarthritis, right shoulder: Secondary | ICD-10-CM

## 2023-05-18 DIAGNOSIS — M25511 Pain in right shoulder: Secondary | ICD-10-CM | POA: Diagnosis not present

## 2023-05-18 DIAGNOSIS — M25522 Pain in left elbow: Secondary | ICD-10-CM | POA: Diagnosis not present

## 2023-05-18 DIAGNOSIS — M19019 Primary osteoarthritis, unspecified shoulder: Secondary | ICD-10-CM | POA: Insufficient documentation

## 2023-05-18 NOTE — Assessment & Plan Note (Signed)
Patient given injection and tolerated the procedure well.  Discussed icing regimen and home exercises, discussed which activities to do and which ones to avoid.  Follow-up again in 6 to 8 weeks

## 2023-05-18 NOTE — Patient Instructions (Addendum)
Injection in [facility] joint Do prescribed exercises at least 3x a week PT referral to [facility] You have 14 days to return or exchange your brace Call [phone], then return the brace to our office  See you again in 2-3 months

## 2023-06-01 DIAGNOSIS — M25511 Pain in right shoulder: Secondary | ICD-10-CM | POA: Diagnosis not present

## 2023-06-01 DIAGNOSIS — M25522 Pain in left elbow: Secondary | ICD-10-CM | POA: Diagnosis not present

## 2023-06-14 DIAGNOSIS — M25511 Pain in right shoulder: Secondary | ICD-10-CM | POA: Diagnosis not present

## 2023-06-14 DIAGNOSIS — M25522 Pain in left elbow: Secondary | ICD-10-CM | POA: Diagnosis not present

## 2023-06-14 NOTE — Progress Notes (Signed)
[facility] - Clinic Note  06/20/2023    CHIEF COMPLAINT Patient presents for Retina Follow Up  HISTORY OF PRESENT ILLNESS: Raymond Alexander is a 77 y.o. male who presents to the clinic today for:   HPI     Retina Follow Up   Patient presents with  Wet AMD.  In both eyes.  This started 5 weeks ago.  I, the attending physician,  performed the HPI with the patient and updated documentation appropriately.        Comments   Patient here for 5 weeks retina follow up for exu ARMD OU. Patient states vision doing pretty good. No flashes at all since last visit. No eye pain.       Last edited by Rennis Chris, MD on 06/20/2023  9:02 AM.    Pt states vision is stable   Referring physician: Darrow Bussing, MD [address] [place],  [place] [address]  HISTORICAL INFORMATION:   Selected notes from the MEDICAL RECORD NUMBER Referred by Dr. Lorin Picket for concern of retinal edema OU   CURRENT MEDICATIONS: No current outpatient medications on file. (Ophthalmic Drugs)   No current facility-administered medications for this visit. (Ophthalmic Drugs)   Current Outpatient Medications (Other)  Medication Sig   aspirin 81 MG tablet Take 81 mg by mouth daily.   atorvastatin (LIPITOR) 80 MG tablet Take 80 mg by mouth daily.   Coenzyme Q10 (CO Q-10) 100 MG CAPS Take 300 mg by mouth daily.   ezetimibe (ZETIA) 10 MG tablet Take 10 mg by mouth daily.   fexofenadine (ALLEGRA) 180 MG tablet Take 180 mg by mouth daily as needed for allergies.    fluticasone (FLONASE) 50 MCG/ACT nasal spray Place 1 spray into both nostrils daily. PRN   IBUPROFEN & ACETAMINOPHEN PO Take by mouth as needed.   lisinopril (PRINIVIL,ZESTRIL) 10 MG tablet Take 10 mg by mouth daily.   Mag Aspart-Potassium Aspart (POTASSIUM & MAGNESIUM ASPARTAT) 250-250 MG CAPS Take 1 tablet by mouth daily.   Multiple Vitamin (MULTIVITAMIN) tablet Take 1 tablet by mouth daily.   nitroGLYCERIN (NITROSTAT) 0.4  MG SL tablet Place 1 tablet (0.4 mg total) under the tongue every 5 (five) minutes as needed for chest pain.   Omega-3 Fatty Acids (FISH OIL) 1000 MG CAPS Take 2 capsules by mouth daily.   OVER THE COUNTER MEDICATION Take 1 tablet by mouth daily. Med Name: PROTANDIM   sildenafil (VIAGRA) 50 MG tablet Take 50 mg by mouth daily as needed for erectile dysfunction.   No current facility-administered medications for this visit. (Other)   REVIEW OF SYSTEMS: ROS   Positive for: Cardiovascular, Eyes Negative for: Constitutional, Gastrointestinal, Neurological, Skin, Genitourinary, Musculoskeletal, HENT, Endocrine, Respiratory, Psychiatric, Allergic/Imm, Heme/Lymph Last edited by Laddie Aquas, COA on 06/20/2023  8:12 AM.     ALLERGIES No Known Allergies  PAST MEDICAL HISTORY Past Medical History:  Diagnosis Date   ASCVD (arteriosclerotic cardiovascular disease)    3 vessel   Clotting disorder (HCC)    Esophageal reflux    Hyperlipidemia    Hypertension    Macular degeneration    Past Surgical History:  Procedure Laterality Date   CATARACT EXTRACTION     PTCA  10/14/2003   mid RCA   FAMILY HISTORY Family History  Problem Relation Age of Onset   Heart attack Father    Cancer Mother    Diabetes Sister    Cancer Brother    Healthy Brother  SOCIAL HISTORY Social History   Tobacco Use   Smoking status: Former   Smokeless tobacco: Never  Building services engineer Use: Never used  Substance Use Topics   Alcohol use: Yes    Alcohol/week: 1.0 standard drink of alcohol    Types: 1 Glasses of wine per week    Comment: 1-2 per day   Drug use: No       OPHTHALMIC EXAM:  Base Eye Exam     Visual Acuity (Snellen - Linear)       Right Left   Dist cc 20/20 20/20    Correction: Glasses         Tonometry (Tonopen, 8:07 AM)       Right Left   Pressure 14 10         Pupils       Dark Light Shape React APD   Right 3 2 Round Brisk None   Left 3 2 Round Brisk None          Visual Fields (Counting fingers)       Left Right    Full Full         Extraocular Movement       Right Left    Full, Ortho Full, Ortho         Neuro/Psych     Oriented x3: Yes   Mood/Affect: Normal         Dilation     Both eyes: 1.0% Mydriacyl, 2.5% Phenylephrine @ 8:07 AM           Slit Lamp and Fundus Exam     Slit Lamp Exam       Right Left   Lids/Lashes Normal small stye nasal LL margin   Conjunctiva/Sclera White and quiet White and quiet   Cornea arcus, well healed cataract wound, trace PEE arcus, well healed cataract wound, trace PEE   Anterior Chamber Deep and quiet Deep and quiet   Iris Round and dilated Round and dilated   Lens PC IOL in good position Toric PC IOL in good position with marks at 0530 and 1100   Anterior Vitreous Mild Vitreous syneresis, no cell or pigment, low lying Posterior vitreous detachment, Weiss ring Mild Vitreous syneresis, no cell or pigment, Posterior vitreous detachment         Fundus Exam       Right Left   Disc Pink and Sharp, mild peripapillary edema -- persistent, ?peripapillary CNV ST to disc -- persistent, no heme Pink and Sharp, peripapillary cystic changes -- improved, CNV ST disc, no heme   C/D Ratio 0.5 0.4   Macula Good foveal reflex, nasal cystic changes/edema - persistent, fine drusen vs exudate - improved, no heme Good foveal reflex, mild edema nasal macula -- improved, no heme   Vessels mild attenuation, mild tortuosity mild attenuation, mild Copper wiring, mild AV crossing changes   Periphery Attached, No heme, no RT/RD Attached, No heme            Refraction     Wearing Rx       Sphere Cylinder Axis Add   Right [place] +0.25 060 +2.25   Left +0.25 Sphere  +2.25           IMAGING AND PROCEDURES  Imaging and Procedures for 06/20/2023  OCT, Retina - OU - Both Eyes       Right Eye Quality was good. Central Foveal Thickness: 284. Progression has been stable. Findings include  normal foveal  contour, no SRF, retinal drusen , intraretinal fluid, pigment epithelial detachment (Persistent peripapillary IRF/cystic changes, stable improvement in focal SRF superior to disc).   Left Eye Quality was good. Central Foveal Thickness: 270. Progression has improved. Findings include normal foveal contour, no SRF, retinal drusen , intraretinal fluid (Persistent peripapillary IRF nasal macula -- slightly improved; SRF stably improved).   Notes *Images captured and stored on drive  Diagnosis / Impression:  OD: Persistent peripapillary IRF/cystic changes, stable improvement in focal SRF superior to disc OS: Persistent peripapillary IRF nasal macula -- slightly improved; SRF stably resolved  Clinical management:  See below  Abbreviations: NFP - Normal foveal profile. CME - cystoid macular edema. PED - pigment epithelial detachment. IRF - intraretinal fluid. SRF - subretinal fluid. EZ - ellipsoid zone. ERM - epiretinal membrane. ORA - outer retinal atrophy. ORT - outer retinal tubulation. SRHM - subretinal hyper-reflective material. IRHM - intraretinal hyper-reflective material      Intravitreal Injection, Pharmacologic Agent - OD - Right Eye       Time Out 06/20/2023. 8:03 AM. Confirmed correct patient, procedure, site, and patient consented.   Anesthesia Topical anesthesia was used. Anesthetic medications included Lidocaine 2%, Proparacaine 0.5%.   Procedure Preparation included 5% betadine to ocular surface, eyelid speculum. A (32g) needle was used.   Injection: 2 mg aflibercept 2 MG/0.05ML   Route: Intravitreal, Site: Right Eye   NDC: L6038910, Lot: [id number], Expiration date: 06/11/2024, Waste: 0 mL   Post-op Post injection exam found visual acuity of at least counting fingers. The patient tolerated the procedure well. There were no complications. The patient received written and verbal post procedure care education. Post injection medications were not given.       Intravitreal Injection, Pharmacologic Agent - OS - Left Eye       Time Out 06/20/2023. 8:04 AM. Confirmed correct patient, procedure, site, and patient consented.   Anesthesia Topical anesthesia was used. Anesthetic medications included Lidocaine 2%, Proparacaine 0.5%.   Procedure Preparation included 5% betadine to ocular surface, eyelid speculum. A (32g) needle was used.   Injection: 2 mg aflibercept 2 MG/0.05ML   Route: Intravitreal, Site: Left Eye   NDC: L6038910, Lot: [id number], Expiration date: 06/11/2024, Waste: 0 mL   Post-op Post injection exam found visual acuity of at least counting fingers. The patient tolerated the procedure well. There were no complications. The patient received written and verbal post procedure care education. Post injection medications were not given.            ASSESSMENT/PLAN:    ICD-10-CM   1. 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 Essential hypertension  I10     3. Hypertensive retinopathy of both eyes  H35.033     4. Pseudophakia, both eyes  Z96.1      1. Peripapillary CNV / exudative ARMD OU  - 8.1.22 pt with 1 wk history of decreased vision OS and isolated light green flashes OD -- flashes OD resolved, blurriness OS improving  - 10.04.22 small recurrent episode of flashes OD last week.   - 11.15.22 subjectively blurred vision OS             -  s/p IVA OS #1 (11.15.22), #2 (12.13.22), #3 (01.10.23), #4 (02.09.23) -- IVA resistance  - s/p IVE OS #1 (04.18.23), #2 (05.16.23), #3 (06.13.23), #4 (07.17.23), #5 (08.15.23), #6 (09.13.23), #7 (10.18.23), #8 (11.27.23), #9 (01.04.24), #10 (02.15.24), #11 (03.21.24), #12 (04.25.24), #13  (05.23.24)  - s/p IVE OD #1 (07.17.23), #2 (08.15.23), #3 (09.13.23), #4 (10.18.23), #5 (11.27.23), #6 (01.04.24), #7 (02.15.24), #8 (03.21.24), #9 (04.25.24), #10 (05.23.24) - BCVA 20/20 OU - stable  - OCT shows OD: persistent peripapillary IRF/cystic changes, stable improvement in focal SRF superior to disc; OS: Persistent peripapillary IRF nasal macula -- slightly improved; SRF stably resolved at 5.5 weeks  - repeat FA (08.23.22) shows mild peripapillary staining and ?leakage OU -- ?CNV  - unclear etiology, but suspect ARMD vs idiopathic CNV  - recommend IVE OU (OD #11 and OS #14) today, 07.08.24 with follow up in 5-6 weeks again  - pt wishes to proceed with injection  - RBA of procedure discussed, questions answered - IVE informed consent obtained and signed, 07.17.23 (OU) - see procedure note   - Amsler grid given to closely monitor for vision changes  - f/u 5-6 weeks, DFE, OCT, possible injections  2,3. Hypertensive retinopathy OU - discussed importance of tight BP control - continue to monitor  4. Pseudophakia OU  - s/p CE/IOL (Dr. Vonna Kotyk, 2022)  - IOLs in good position, doing well - continue to monitor  Ophthalmic Meds Ordered this visit:  Meds ordered this encounter  Medications   aflibercept (EYLEA) SOLN 2 mg   aflibercept (EYLEA) SOLN 2 mg     Return for f/u 5-6 weeks, exu ARMD OU, DFE, OCT.  There are no Patient Instructions on file for this visit.  Explained the diagnoses, plan, and follow up with the patient and they expressed understanding.  Patient expressed understanding of the importance of proper follow up care.   This document serves as a record of services personally performed by Karie Chimera, MD, PhD. It was created on their behalf by Annalee Genta, COMT. The creation of this record is the provider's dictation and/or activities during the visit.  Electronically signed by: Annalee Genta, COMT 06/20/23 9:08 AM  This document serves as a record of services  personally performed by Karie Chimera, MD, PhD. It was created on their behalf by Glee Arvin. Manson Passey, OA an ophthalmic technician. The creation of this record is the provider's dictation and/or activities during the visit.    Electronically signed by: Glee Arvin. Manson Passey, OA 06/20/23 9:08 AM  Karie Chimera, M.D., Ph.D. Diseases & Surgery of the Retina and Vitreous [facility]  I have reviewed the above documentation for accuracy and completeness, and I agree with the above. Karie Chimera, M.D., Ph.D. 06/20/23 9:09 AM  Abbreviations: M myopia (nearsighted); A astigmatism; H hyperopia (farsighted); P presbyopia; Mrx spectacle prescription;  CTL contact lenses; OD right eye; OS left eye; OU both eyes  XT exotropia; ET esotropia; PEK punctate epithelial keratitis; PEE punctate epithelial erosions; DES dry eye syndrome; MGD meibomian gland dysfunction; ATs artificial tears; PFAT's preservative free artificial tears; NSC nuclear sclerotic cataract; PSC posterior subcapsular cataract; ERM epi-retinal membrane; PVD posterior vitreous detachment; RD retinal detachment; DM diabetes mellitus; DR diabetic retinopathy; NPDR non-proliferative diabetic retinopathy; PDR proliferative diabetic retinopathy; CSME clinically significant macular edema; DME diabetic macular edema; dbh dot blot hemorrhages; CWS cotton wool spot; POAG primary open angle glaucoma; C/D cup-to-disc ratio; HVF humphrey visual field; GVF goldmann visual field; OCT optical  coherence tomography; IOP intraocular pressure; BRVO Branch retinal vein occlusion; CRVO central retinal vein occlusion; CRAO central retinal artery occlusion; BRAO branch retinal artery occlusion; RT retinal tear; SB scleral buckle; PPV pars plana vitrectomy; VH Vitreous hemorrhage; PRP panretinal laser photocoagulation; IVK intravitreal kenalog; VMT vitreomacular traction; MH Macular hole;  NVD neovascularization of the disc; NVE neovascularization elsewhere;  AREDS age related eye disease study; ARMD age related macular degeneration; POAG primary open angle glaucoma; EBMD epithelial/anterior basement membrane dystrophy; ACIOL anterior chamber intraocular lens; IOL intraocular lens; PCIOL posterior chamber intraocular lens; Phaco/IOL phacoemulsification with intraocular lens placement; PRK photorefractive keratectomy; LASIK laser assisted in situ keratomileusis; HTN hypertension; DM diabetes mellitus; COPD chronic obstructive pulmonary disease

## 2023-06-20 ENCOUNTER — Encounter (INDEPENDENT_AMBULATORY_CARE_PROVIDER_SITE_OTHER): Payer: Self-pay | Admitting: Ophthalmology

## 2023-06-20 ENCOUNTER — Ambulatory Visit (INDEPENDENT_AMBULATORY_CARE_PROVIDER_SITE_OTHER): Payer: PPO | Admitting: Ophthalmology

## 2023-06-20 DIAGNOSIS — Z961 Presence of intraocular lens: Secondary | ICD-10-CM

## 2023-06-20 DIAGNOSIS — I10 Essential (primary) hypertension: Secondary | ICD-10-CM | POA: Diagnosis not present

## 2023-06-20 DIAGNOSIS — H35033 Hypertensive retinopathy, bilateral: Secondary | ICD-10-CM

## 2023-06-20 DIAGNOSIS — H353231 Exudative age-related macular degeneration, bilateral, with active choroidal neovascularization: Secondary | ICD-10-CM | POA: Diagnosis not present

## 2023-06-20 MED ORDER — AFLIBERCEPT 2MG/0.05ML IZ SOLN FOR KALEIDOSCOPE
2.0000 mg | INTRAVITREAL | Status: AC | PRN
Start: 2023-06-20 — End: 2023-06-20
  Administered 2023-06-20: 2 mg via INTRAVITREAL

## 2023-06-20 MED ORDER — AFLIBERCEPT 2MG/0.05ML IZ SOLN FOR KALEIDOSCOPE
2.00 mg | INTRAVITREAL | Status: AC | PRN
Start: 2023-06-20 — End: 2023-06-20
  Administered 2023-06-20: 2 mg via INTRAVITREAL

## 2023-06-21 DIAGNOSIS — M25511 Pain in right shoulder: Secondary | ICD-10-CM | POA: Diagnosis not present

## 2023-06-21 DIAGNOSIS — M25522 Pain in left elbow: Secondary | ICD-10-CM | POA: Diagnosis not present

## 2023-06-28 DIAGNOSIS — M25522 Pain in left elbow: Secondary | ICD-10-CM | POA: Diagnosis not present

## 2023-06-28 DIAGNOSIS — M25511 Pain in right shoulder: Secondary | ICD-10-CM | POA: Diagnosis not present

## 2023-07-21 DIAGNOSIS — M25511 Pain in right shoulder: Secondary | ICD-10-CM | POA: Diagnosis not present

## 2023-07-21 DIAGNOSIS — M25522 Pain in left elbow: Secondary | ICD-10-CM | POA: Diagnosis not present

## 2023-07-27 DIAGNOSIS — M25511 Pain in right shoulder: Secondary | ICD-10-CM | POA: Diagnosis not present

## 2023-07-27 DIAGNOSIS — M25522 Pain in left elbow: Secondary | ICD-10-CM | POA: Diagnosis not present

## 2023-07-27 NOTE — Progress Notes (Signed)
[facility] [address] [place] [address] Phone: [phone] Subjective:   INadine Counts, am serving as a scribe for Dr. Antoine Primas.  I'm seeing this patient by the request  of:  Koirala, Dibas, MD  CC: neck and shoulder pain f/u   [id number]  05/18/2023 Patient given injection and tolerated the procedure well.  Discussed icing regimen and home exercises, discussed which activities to do and which ones to avoid.  Follow-up again in 6 to 8 weeks     Updated 08/02/2023 Raymond Alexander is a 77 y.o. male coming in with complaint of neck and shoulder pain. Shoulder pain is much better. Forearm pain better, but not as much progress as shoulder. Has been getting dry needling done. No new concerns.       Past Medical History:  Diagnosis Date   ASCVD (arteriosclerotic cardiovascular disease)    3 vessel   Clotting disorder (HCC)    Esophageal reflux    Hyperlipidemia    Hypertension    Macular degeneration    Past Surgical History:  Procedure Laterality Date   CATARACT EXTRACTION     PTCA  10/14/2003   mid RCA   Social History   Socioeconomic History   Marital status: Married    Spouse name: Not on file   Number of children: Not on file   Years of education: Not on file   Highest education level: Not on file  Occupational History   Not on file  Tobacco Use   Smoking status: Former   Smokeless tobacco: Never  Vaping Use   Vaping status: Never Used  Substance and Sexual Activity   Alcohol use: Yes    Alcohol/week: 1.0 standard drink of alcohol    Types: 1 Glasses of wine per week    Comment: 1-2 per day   Drug use: No   Sexual activity: Yes  Other Topics Concern   Not on file  Social History Narrative   Not on file   Social Determinants of Health   Financial Resource Strain: Not on file  Food Insecurity: Not on file  Transportation Needs: Not on file  Physical Activity: Not on file  Stress: Not on file  Social  Connections: Not on file   No Known Allergies Family History  Problem Relation Age of Onset   Heart attack Father    Cancer Mother    Diabetes Sister    Cancer Brother    Healthy Brother      Current Outpatient Medications (Cardiovascular):    atorvastatin (LIPITOR) 80 MG tablet, Take 80 mg by mouth daily.   ezetimibe (ZETIA) 10 MG tablet, Take 10 mg by mouth daily.   lisinopril (PRINIVIL,ZESTRIL) 10 MG tablet, Take 10 mg by mouth daily.   nitroGLYCERIN (NITROSTAT) 0.4 MG SL tablet, Place 1 tablet (0.4 mg total) under the tongue every 5 (five) minutes as needed for chest pain.   sildenafil (VIAGRA) 50 MG tablet, Take 50 mg by mouth daily as needed for erectile dysfunction.  Current Outpatient Medications (Respiratory):    fexofenadine (ALLEGRA) 180 MG tablet, Take 180 mg by mouth daily as needed for allergies.    fluticasone (FLONASE) 50 MCG/ACT nasal spray, Place 1 spray into both nostrils daily. PRN  Current Outpatient Medications (Analgesics):    aspirin 81 MG tablet, Take 81 mg by mouth daily.   IBUPROFEN & ACETAMINOPHEN PO, Take by mouth as needed.   Current Outpatient Medications (Other):    Coenzyme  Q10 (CO Q-10) 100 MG CAPS, Take 300 mg by mouth daily.   Mag Aspart-Potassium Aspart (POTASSIUM & MAGNESIUM ASPARTAT) 250-250 MG CAPS, Take 1 tablet by mouth daily.   Multiple Vitamin (MULTIVITAMIN) tablet, Take 1 tablet by mouth daily.   Omega-3 Fatty Acids (FISH OIL) 1000 MG CAPS, Take 2 capsules by mouth daily.   OVER THE COUNTER MEDICATION, Take 1 tablet by mouth daily. Med Name: PROTANDIM   Reviewed prior external information including notes and imaging from  primary care provider As well as notes that were available from care everywhere and other healthcare systems.  Past medical history, social, surgical and family history all reviewed in electronic medical record.  No pertanent information unless stated regarding to the chief complaint.   Review of Systems:  No  headache, visual changes, nausea, vomiting, diarrhea, constipation, dizziness, abdominal pain, skin rash, fevers, chills, night sweats, weight loss, swollen lymph nodes, body aches, joint swelling, chest pain, shortness of breath, mood changes. POSITIVE muscle aches  Objective  Blood pressure 114/66, pulse 66, height 5\' 10"  (1.778 m), weight 183 lb (83 kg), SpO2 95%.   General: No apparent distress alert and oriented x3 mood and affect normal, dressed appropriately.  HEENT: Pupils equal, extraocular movements intact  Respiratory: Patient's speak in full sentences and does not appear short of breath  Cardiovascular: No lower extremity edema, non tender, no erythema  Right shoulder exam shows the patient has good range of motion noted.  Good strength noted.  Negative crossover noted today. Patient's left elbow does have some tenderness over the supinator.  Worsening pain with resisted supination.  Otherwise fully unremarkable.   Impression and Recommendations:    The above documentation has been reviewed and is accurate and complete Judi Saa, DO

## 2023-07-28 NOTE — Progress Notes (Signed)
[facility] - Clinic Note  07/29/2023    CHIEF COMPLAINT Patient presents for Retina Follow Up  HISTORY OF PRESENT ILLNESS: Raymond Alexander is a 77 y.o. male who presents to the clinic today for:   HPI     Retina Follow Up   Patient presents with  Wet AMD.  In both eyes.  This started 5.5 weeks ago.  I, the attending physician,  performed the HPI with the patient and updated documentation appropriately.        Comments   Patient here for 5.5 weeks retina follow up for exu ARMD OU. Patient states vision doing ok. No eye pain.       Last edited by Rennis Chris, MD on 07/29/2023 12:41 PM.    Pt states vision is stable   Referring physician: Darrow Bussing, MD [address] [place],  [place] [address]  HISTORICAL INFORMATION:   Selected notes from the MEDICAL RECORD NUMBER Referred by Dr. Lorin Picket for concern of retinal edema OU   CURRENT MEDICATIONS: No current outpatient medications on file. (Ophthalmic Drugs)   No current facility-administered medications for this visit. (Ophthalmic Drugs)   Current Outpatient Medications (Other)  Medication Sig   aspirin 81 MG tablet Take 81 mg by mouth daily.   atorvastatin (LIPITOR) 80 MG tablet Take 80 mg by mouth daily.   Coenzyme Q10 (CO Q-10) 100 MG CAPS Take 300 mg by mouth daily.   ezetimibe (ZETIA) 10 MG tablet Take 10 mg by mouth daily.   fexofenadine (ALLEGRA) 180 MG tablet Take 180 mg by mouth daily as needed for allergies.    fluticasone (FLONASE) 50 MCG/ACT nasal spray Place 1 spray into both nostrils daily. PRN   IBUPROFEN & ACETAMINOPHEN PO Take by mouth as needed.   lisinopril (PRINIVIL,ZESTRIL) 10 MG tablet Take 10 mg by mouth daily.   Mag Aspart-Potassium Aspart (POTASSIUM & MAGNESIUM ASPARTAT) 250-250 MG CAPS Take 1 tablet by mouth daily.   Multiple Vitamin (MULTIVITAMIN) tablet Take 1 tablet by mouth daily.   nitroGLYCERIN (NITROSTAT) 0.4 MG SL tablet Place 1 tablet (0.4 mg  total) under the tongue every 5 (five) minutes as needed for chest pain.   Omega-3 Fatty Acids (FISH OIL) 1000 MG CAPS Take 2 capsules by mouth daily.   OVER THE COUNTER MEDICATION Take 1 tablet by mouth daily. Med Name: PROTANDIM   sildenafil (VIAGRA) 50 MG tablet Take 50 mg by mouth daily as needed for erectile dysfunction.   No current facility-administered medications for this visit. (Other)   REVIEW OF SYSTEMS: ROS   Positive for: Cardiovascular, Eyes Negative for: Constitutional, Gastrointestinal, Neurological, Skin, Genitourinary, Musculoskeletal, HENT, Endocrine, Respiratory, Psychiatric, Allergic/Imm, Heme/Lymph Last edited by Laddie Aquas, COA on 07/29/2023  8:14 AM.      ALLERGIES No Known Allergies  PAST MEDICAL HISTORY Past Medical History:  Diagnosis Date   ASCVD (arteriosclerotic cardiovascular disease)    3 vessel   Clotting disorder (HCC)    Esophageal reflux    Hyperlipidemia    Hypertension    Macular degeneration    Past Surgical History:  Procedure Laterality Date   CATARACT EXTRACTION     PTCA  10/14/2003   mid RCA   FAMILY HISTORY Family History  Problem Relation Age of Onset   Heart attack Father    Cancer Mother    Diabetes Sister    Cancer Brother    Healthy Brother    SOCIAL HISTORY Social History  Tobacco Use   Smoking status: Former   Smokeless tobacco: Never  Vaping Use   Vaping status: Never Used  Substance Use Topics   Alcohol use: Yes    Alcohol/week: 1.0 standard drink of alcohol    Types: 1 Glasses of wine per week    Comment: 1-2 per day   Drug use: No       OPHTHALMIC EXAM:  Base Eye Exam     Visual Acuity (Snellen - Linear)       Right Left   Dist cc 20/20 -1 20/20 -1    Correction: Glasses         Tonometry (Tonopen, 8:12 AM)       Right Left   Pressure 08 08         Pupils       Dark Light Shape React APD   Right 3 2 Round Brisk None   Left 3 2 Round Brisk None         Visual  Fields (Counting fingers)       Left Right    Full Full         Extraocular Movement       Right Left    Full, Ortho Full, Ortho         Neuro/Psych     Oriented x3: Yes   Mood/Affect: Normal         Dilation     Both eyes: 1.0% Mydriacyl, 2.5% Phenylephrine @ 8:11 AM           Slit Lamp and Fundus Exam     Slit Lamp Exam       Right Left   Lids/Lashes Normal small stye nasal LL margin   Conjunctiva/Sclera White and quiet White and quiet   Cornea arcus, well healed cataract wound, trace PEE arcus, well healed cataract wound, trace PEE   Anterior Chamber Deep and quiet Deep and quiet   Iris Round and dilated Round and dilated   Lens PC IOL in good position Toric PC IOL in good position with marks at 0530 and 1100   Anterior Vitreous Mild Vitreous syneresis, no cell or pigment, low lying Posterior vitreous detachment, Weiss ring Mild Vitreous syneresis, no cell or pigment, Posterior vitreous detachment         Fundus Exam       Right Left   Disc Pink and Sharp, mild peripapillary edema -- persistent, ?peripapillary CNV ST to disc -- persistent, no heme Pink and Sharp, peripapillary cystic changes -- improved, CNV ST disc, no heme   C/D Ratio 0.5 0.4   Macula Good foveal reflex, nasal cystic changes/edema - persistent, fine drusen vs exudate - improved, no heme Good foveal reflex, mild edema nasal macula -- improved, no heme   Vessels attenuated, Tortuous attenuated, Tortuous   Periphery Attached, No heme, no RT/RD Attached, No heme            Refraction     Wearing Rx       Sphere Cylinder Axis Add   Right [place] +0.25 060 +2.25   Left +0.25 Sphere  +2.25           IMAGING AND PROCEDURES  Imaging and Procedures for 07/29/2023  OCT, Retina - OU - Both Eyes       Right Eye Quality was good. Central Foveal Thickness: 283. Progression has been stable. Findings include normal foveal contour, no SRF, retinal drusen , intraretinal fluid, pigment  epithelial detachment (Persistent peripapillary  IRF/cystic changes, stable improvement in focal SRF superior to disc).   Left Eye Quality was good. Central Foveal Thickness: 269. Progression has improved. Findings include normal foveal contour, no SRF, retinal drusen , intraretinal fluid (Persistent peripapillary IRF nasal macula -- slightly improved; SRF stably improved).   Notes *Images captured and stored on drive  Diagnosis / Impression:  OD: Persistent peripapillary IRF/cystic changes, stable improvement in focal SRF superior to disc OS: Persistent peripapillary IRF nasal macula -- slightly improved; SRF stably resolved  Clinical management:  See below  Abbreviations: NFP - Normal foveal profile. CME - cystoid macular edema. PED - pigment epithelial detachment. IRF - intraretinal fluid. SRF - subretinal fluid. EZ - ellipsoid zone. ERM - epiretinal membrane. ORA - outer retinal atrophy. ORT - outer retinal tubulation. SRHM - subretinal hyper-reflective material. IRHM - intraretinal hyper-reflective material      Intravitreal Injection, Pharmacologic Agent - OD - Right Eye       Time Out 07/29/2023. 9:01 AM. Confirmed correct patient, procedure, site, and patient consented.   Anesthesia Topical anesthesia was used. Anesthetic medications included Lidocaine 2%, Proparacaine 0.5%.   Procedure Preparation included 5% betadine to ocular surface, eyelid speculum. A (32g) needle was used.   Injection: 2 mg aflibercept 2 MG/0.05ML   Route: Intravitreal, Site: Right Eye   NDC: L6038910, Lot: [id number], Expiration date: 03/12/2024, Waste: 0 mL   Post-op Post injection exam found visual acuity of at least counting fingers. The patient tolerated the procedure well. There were no complications. The patient received written and verbal post procedure care education. Post injection medications were not given.      Intravitreal Injection, Pharmacologic Agent - OS - Left Eye        Time Out 07/29/2023. 9:01 AM. Confirmed correct patient, procedure, site, and patient consented.   Anesthesia Topical anesthesia was used. Anesthetic medications included Lidocaine 2%, Proparacaine 0.5%.   Procedure Preparation included 5% betadine to ocular surface, eyelid speculum. A (32g) needle was used.   Injection: 2 mg aflibercept 2 MG/0.05ML   Route: Intravitreal, Site: Left Eye   NDC: L6038910, Lot: [id number], Expiration date: 03/12/2024, Waste: 0 mL   Post-op Post injection exam found visual acuity of at least counting fingers. The patient tolerated the procedure well. There were no complications. The patient received written and verbal post procedure care education. Post injection medications were not given.            ASSESSMENT/PLAN:    ICD-10-CM   1. 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 Essential hypertension  I10     3. Hypertensive retinopathy of both eyes  H35.033     4. Pseudophakia, both eyes  Z96.1       1. Peripapillary CNV / exudative ARMD OU  - 8.1.22 pt with 1 wk history of decreased vision OS and isolated light green flashes OD -- flashes OD resolved, blurriness OS improving  - 10.04.22 small recurrent episode of flashes OD last week.   - 11.15.22 subjectively blurred vision OS             - s/p IVA OS #1 (11.15.22), #2 (12.13.22), #3 (01.10.23), #4 (02.09.23) --  IVA resistance  - s/p IVE OS #1 (04.18.23), #2 (05.16.23), #3 (06.13.23), #4 (07.17.23), #5 (08.15.23), #6 (09.13.23), #7 (10.18.23), #8 (11.27.23), #9 (01.04.24), #10 (02.15.24), #11 (03.21.24), #12 (04.25.24), #13 (05.23.24), #14 (07.08.24)  - s/p IVE OD #1 (07.17.23), #2 (08.15.23),  #3 (09.13.23), #4 (10.18.23), #5 (11.27.23), #6 (01.04.24), #7 (02.15.24), #8 (03.21.24), #9 (04.25.24), #10 (05.23.24), #11 (07.08.24) - BCVA 20/20 OU - stable  - OCT shows OD: persistent peripapillary IRF/cystic changes, stable improvement in focal SRF superior to disc; OS: Persistent peripapillary IRF nasal macula -- slightly improved; SRF stably resolved at 5.5 weeks  - repeat FA (08.23.22) shows mild peripapillary staining and ?leakage OU -- ?CNV  - unclear etiology, but suspect ARMD vs idiopathic CNV  - recommend IVE OU (OD #12 and OS #15 today, 08.15.24 with follow up in 5-6 weeks again  - pt wishes to proceed with injection  - RBA of procedure discussed, questions answered - IVE informed consent obtained and signed, 07.17.23 (OU) - see procedure note   - Amsler grid given to closely monitor for vision changes  - f/u 5-6 weeks, DFE, OCT, possible injections  2,3. Hypertensive retinopathy OU - discussed importance of tight BP control - continue to monitor  4. Pseudophakia OU  - s/p CE/IOL (Dr. Vonna Kotyk, 2022)  - IOLs in good position, doing well - continue to monitor  Ophthalmic Meds Ordered this visit:  Meds ordered this encounter  Medications   aflibercept (EYLEA) SOLN 2 mg   aflibercept (EYLEA) SOLN 2 mg     Return for f/u 5-6 weeks, exu ARMD OU, DFE, OCT.  There are no Patient Instructions on file for this visit.  This document serves as a record of services personally performed by Karie Chimera, MD, PhD. It was created on their behalf by Berlin Hun COT, an ophthalmic technician. The creation of this record is the provider's dictation and/or activities during the visit.    Electronically signed by: Berlin Hun COT 8.15.24 12:43 PM  This document serves as a record of services personally performed by Karie Chimera, MD, PhD. It was created on their behalf by Glee Arvin. Manson Passey, OA an ophthalmic technician. The creation of this record is the provider's  dictation and/or activities during the visit.    Electronically signed by: Glee Arvin. Manson Passey, OA 07/29/23 12:43 PM  Karie Chimera, M.D., Ph.D. Diseases & Surgery of the Retina and Vitreous [facility] 07/29/2023   I have reviewed the above documentation for accuracy and completeness, and I agree with the above. Karie Chimera, M.D., Ph.D. 07/29/23 12:43 PM  Abbreviations: M myopia (nearsighted); A astigmatism; H hyperopia (farsighted); P presbyopia; Mrx spectacle prescription;  CTL contact lenses; OD right eye; OS left eye; OU both eyes  XT exotropia; ET esotropia; PEK punctate epithelial keratitis; PEE punctate epithelial erosions; DES dry eye syndrome; MGD meibomian gland dysfunction; ATs artificial tears; PFAT's preservative free artificial tears; NSC nuclear sclerotic cataract; PSC posterior subcapsular cataract; ERM epi-retinal membrane; PVD posterior vitreous detachment; RD retinal detachment; DM diabetes mellitus; DR diabetic retinopathy; NPDR non-proliferative diabetic retinopathy; PDR proliferative diabetic retinopathy; CSME clinically significant macular edema; DME diabetic macular edema; dbh dot blot hemorrhages; CWS cotton wool spot; POAG primary open angle glaucoma; C/D cup-to-disc ratio; HVF humphrey visual field; GVF goldmann visual field; OCT optical coherence tomography; IOP intraocular pressure; BRVO Branch retinal vein occlusion; CRVO central retinal vein occlusion; CRAO central retinal artery occlusion; BRAO branch retinal artery occlusion; RT retinal tear; SB  scleral buckle; PPV pars plana vitrectomy; VH Vitreous hemorrhage; PRP panretinal laser photocoagulation; IVK intravitreal kenalog; VMT vitreomacular traction; MH Macular hole;  NVD neovascularization of the disc; NVE neovascularization elsewhere; AREDS age related eye disease study; ARMD age related macular degeneration; POAG primary open angle glaucoma; EBMD epithelial/anterior basement membrane  dystrophy; ACIOL anterior chamber intraocular lens; IOL intraocular lens; PCIOL posterior chamber intraocular lens; Phaco/IOL phacoemulsification with intraocular lens placement; PRK photorefractive keratectomy; LASIK laser assisted in situ keratomileusis; HTN hypertension; DM diabetes mellitus; COPD chronic obstructive pulmonary disease

## 2023-07-29 ENCOUNTER — Ambulatory Visit (INDEPENDENT_AMBULATORY_CARE_PROVIDER_SITE_OTHER): Payer: PPO | Admitting: Ophthalmology

## 2023-07-29 ENCOUNTER — Encounter (INDEPENDENT_AMBULATORY_CARE_PROVIDER_SITE_OTHER): Payer: Self-pay | Admitting: Ophthalmology

## 2023-07-29 DIAGNOSIS — Z961 Presence of intraocular lens: Secondary | ICD-10-CM

## 2023-07-29 DIAGNOSIS — I10 Essential (primary) hypertension: Secondary | ICD-10-CM | POA: Diagnosis not present

## 2023-07-29 DIAGNOSIS — H353231 Exudative age-related macular degeneration, bilateral, with active choroidal neovascularization: Secondary | ICD-10-CM | POA: Diagnosis not present

## 2023-07-29 DIAGNOSIS — H35033 Hypertensive retinopathy, bilateral: Secondary | ICD-10-CM

## 2023-07-29 MED ORDER — AFLIBERCEPT 2MG/0.05ML IZ SOLN FOR KALEIDOSCOPE
2.0000 mg | INTRAVITREAL | Status: AC | PRN
Start: 2023-07-29 — End: 2023-07-29
  Administered 2023-07-29: 2 mg via INTRAVITREAL

## 2023-08-02 ENCOUNTER — Ambulatory Visit: Payer: PPO | Admitting: Family Medicine

## 2023-08-02 VITALS — BP 114/66 | HR 66 | Ht 70.0 in | Wt 183.0 lb

## 2023-08-02 DIAGNOSIS — M25522 Pain in left elbow: Secondary | ICD-10-CM

## 2023-08-02 DIAGNOSIS — M19011 Primary osteoarthritis, right shoulder: Secondary | ICD-10-CM | POA: Diagnosis not present

## 2023-08-02 NOTE — Patient Instructions (Signed)
Graston tool Do prescribed exercises at least 3x a week See you again in 2 months if not perfect

## 2023-08-02 NOTE — Assessment & Plan Note (Signed)
Supinator syndrome noted.  Discussed icing regimen and home exercises, discussed which activities to do and which ones to avoid.  Increase activity slowly.  Patient will be given home exercises.  We discussed potential Graston tool.  Follow-up again in 6 to 8 weeks otherwise

## 2023-08-02 NOTE — Assessment & Plan Note (Signed)
Significant improvement at this moment.  Discussed which activities to do and which ones to avoid.  Increase activity slowly.  Discussed at this moment if worsening can consider another injection but patient responded extremely well.

## 2023-08-10 DIAGNOSIS — M25511 Pain in right shoulder: Secondary | ICD-10-CM | POA: Diagnosis not present

## 2023-08-10 DIAGNOSIS — M25522 Pain in left elbow: Secondary | ICD-10-CM | POA: Diagnosis not present

## 2023-09-02 NOTE — Progress Notes (Signed)
[facility] - Clinic Note  09/07/2023    CHIEF COMPLAINT Patient presents for Retina Follow Up  HISTORY OF PRESENT ILLNESS: Raymond Alexander is a 77 y.o. male who presents to the clinic today for:   HPI     Retina Follow Up   Patient presents with  Wet AMD.  In both eyes.  This started 6 weeks ago.  I, the attending physician,  performed the HPI with the patient and updated documentation appropriately.        Comments   Patient here for 6 weeks retina follow up for exu ARMD OU. Patient states vision doing pretty good. No eye pain.       Last edited by Rennis Chris, MD on 09/07/2023 10:48 AM.     Patient states the vision is doing well.   Referring physician: Darrow Bussing, MD [address] [place],  [place] [address]  HISTORICAL INFORMATION:   Selected notes from the MEDICAL RECORD NUMBER Referred by Dr. Lorin Picket for concern of retinal edema OU   CURRENT MEDICATIONS: No current outpatient medications on file. (Ophthalmic Drugs)   No current facility-administered medications for this visit. (Ophthalmic Drugs)   Current Outpatient Medications (Other)  Medication Sig   aspirin 81 MG tablet Take 81 mg by mouth daily.   atorvastatin (LIPITOR) 80 MG tablet Take 80 mg by mouth daily.   Coenzyme Q10 (CO Q-10) 100 MG CAPS Take 300 mg by mouth daily.   ezetimibe (ZETIA) 10 MG tablet Take 10 mg by mouth daily.   fexofenadine (ALLEGRA) 180 MG tablet Take 180 mg by mouth daily as needed for allergies.    fluticasone (FLONASE) 50 MCG/ACT nasal spray Place 1 spray into both nostrils daily. PRN   IBUPROFEN & ACETAMINOPHEN PO Take by mouth as needed.   lisinopril (PRINIVIL,ZESTRIL) 10 MG tablet Take 10 mg by mouth daily.   Mag Aspart-Potassium Aspart (POTASSIUM & MAGNESIUM ASPARTAT) 250-250 MG CAPS Take 1 tablet by mouth daily.   Multiple Vitamin (MULTIVITAMIN) tablet Take 1 tablet by mouth daily.   nitroGLYCERIN (NITROSTAT) 0.4 MG SL tablet Place 1  tablet (0.4 mg total) under the tongue every 5 (five) minutes as needed for chest pain.   Omega-3 Fatty Acids (FISH OIL) 1000 MG CAPS Take 2 capsules by mouth daily.   OVER THE COUNTER MEDICATION Take 1 tablet by mouth daily. Med Name: PROTANDIM   sildenafil (VIAGRA) 50 MG tablet Take 50 mg by mouth daily as needed for erectile dysfunction.   No current facility-administered medications for this visit. (Other)   REVIEW OF SYSTEMS: ROS   Positive for: Cardiovascular, Eyes Negative for: Constitutional, Gastrointestinal, Neurological, Skin, Genitourinary, Musculoskeletal, HENT, Endocrine, Respiratory, Psychiatric, Allergic/Imm, Heme/Lymph Last edited by Laddie Aquas, COA on 09/07/2023  9:05 AM.       ALLERGIES No Known Allergies  PAST MEDICAL HISTORY Past Medical History:  Diagnosis Date   ASCVD (arteriosclerotic cardiovascular disease)    3 vessel   Clotting disorder (HCC)    Esophageal reflux    Hyperlipidemia    Hypertension    Macular degeneration    Past Surgical History:  Procedure Laterality Date   CATARACT EXTRACTION     PTCA  10/14/2003   mid RCA   FAMILY HISTORY Family History  Problem Relation Age of Onset   Heart attack Father    Cancer Mother    Diabetes Sister    Cancer Brother    Healthy Brother    SOCIAL  HISTORY Social History   Tobacco Use   Smoking status: Former   Smokeless tobacco: Never  Vaping Use   Vaping status: Never Used  Substance Use Topics   Alcohol use: Yes    Alcohol/week: 1.0 standard drink of alcohol    Types: 1 Glasses of wine per week    Comment: 1-2 per day   Drug use: No       OPHTHALMIC EXAM:  Base Eye Exam     Visual Acuity (Snellen - Linear)       Right Left   Dist cc 20/20 20/20 -1    Correction: Glasses         Tonometry (Tonopen, 9:04 AM)       Right Left   Pressure 10 11         Pupils       Dark Light Shape React APD   Right 3 2 Round Brisk None   Left 3 2 Round Brisk None          Visual Fields (Counting fingers)       Left Right    Full Full         Extraocular Movement       Right Left    Full, Ortho Full, Ortho         Neuro/Psych     Oriented x3: Yes   Mood/Affect: Normal         Dilation     Both eyes: 1.0% Mydriacyl, 2.5% Phenylephrine @ 9:03 AM           Slit Lamp and Fundus Exam     Slit Lamp Exam       Right Left   Lids/Lashes Normal small stye nasal LL margin   Conjunctiva/Sclera White and quiet White and quiet   Cornea arcus, well healed cataract wound, trace PEE arcus, well healed cataract wound, trace PEE   Anterior Chamber Deep and quiet Deep and quiet   Iris Round and dilated Round and dilated   Lens PC IOL in good position Toric PC IOL in good position with marks at 0530 and 1100   Anterior Vitreous Mild Vitreous syneresis, no cell or pigment, low lying Posterior vitreous detachment, Weiss ring Mild Vitreous syneresis, no cell or pigment, Posterior vitreous detachment         Fundus Exam       Right Left   Disc Pink and Sharp, mild peripapillary edema -- persistent, ?peripapillary CNV ST to disc -- persistent, no heme Pink and Sharp, peripapillary cystic changes -- improved, CNV ST disc, no heme   C/D Ratio 0.5 0.4   Macula Good foveal reflex, nasal cystic changes/edema - persistent, fine drusen vs exudate - improved, no heme Good foveal reflex, mild edema nasal macula -- improved, no heme   Vessels attenuated, Tortuous attenuated, Tortuous   Periphery Attached, No heme, no RT/RD Attached, No heme            Refraction     Wearing Rx       Sphere Cylinder Axis Add   Right [place] +0.25 060 +2.25   Left +0.25 Sphere  +2.25           IMAGING AND PROCEDURES  Imaging and Procedures for 09/07/2023  OCT, Retina - OU - Both Eyes       Right Eye Quality was good. Central Foveal Thickness: 284. Progression has been stable. Findings include normal foveal contour, no SRF, retinal drusen , intraretinal  fluid, pigment  epithelial detachment (Persistent peripapillary IRF/cystic changes, stable improvement in focal SRF superior to disc).   Left Eye Quality was good. Central Foveal Thickness: 266. Progression has improved. Findings include normal foveal contour, no SRF, retinal drusen , intraretinal fluid (Persistent peripapillary IRF nasal macula -- slightly improved; SRF stably improved).   Notes *Images captured and stored on drive  Diagnosis / Impression:  OD: Persistent peripapillary IRF/cystic changes, stable improvement in focal SRF superior to disc OS: Persistent peripapillary IRF nasal macula -- slightly improved; SRF stably resolved  Clinical management:  See below  Abbreviations: NFP - Normal foveal profile. CME - cystoid macular edema. PED - pigment epithelial detachment. IRF - intraretinal fluid. SRF - subretinal fluid. EZ - ellipsoid zone. ERM - epiretinal membrane. ORA - outer retinal atrophy. ORT - outer retinal tubulation. SRHM - subretinal hyper-reflective material. IRHM - intraretinal hyper-reflective material      Intravitreal Injection, Pharmacologic Agent - OD - Right Eye       Time Out 09/07/2023. 9:21 AM. Confirmed correct patient, procedure, site, and patient consented.   Anesthesia Topical anesthesia was used. Anesthetic medications included Lidocaine 2%, Proparacaine 0.5%.   Procedure Preparation included 5% betadine to ocular surface, eyelid speculum. A (32g) needle was used.   Injection: 2 mg aflibercept 2 MG/0.05ML   Route: Intravitreal, Site: Right Eye   NDC: L6038910, Lot: [id number], Expiration date: 11/11/2024, Waste: 0 mL   Post-op Post injection exam found visual acuity of at least counting fingers. The patient tolerated the procedure well. There were no complications. The patient received written and verbal post procedure care education. Post injection medications were not given.      Intravitreal Injection, Pharmacologic Agent - OS -  Left Eye       Time Out 09/07/2023. 9:21 AM. Confirmed correct patient, procedure, site, and patient consented.   Anesthesia Topical anesthesia was used. Anesthetic medications included Lidocaine 2%, Proparacaine 0.5%.   Procedure Preparation included 5% betadine to ocular surface, eyelid speculum. A (32g) needle was used.   Injection: 2 mg aflibercept 2 MG/0.05ML   Route: Intravitreal, Site: Left Eye   NDC: L6038910, Lot: [id number], Expiration date: 11/11/2024, Waste: 0 mL   Post-op Post injection exam found visual acuity of at least counting fingers. The patient tolerated the procedure well. There were no complications. The patient received written and verbal post procedure care education. Post injection medications were not given.            ASSESSMENT/PLAN:    ICD-10-CM   1. 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 Essential hypertension  I10     3. Hypertensive retinopathy of both eyes  H35.033     4. Pseudophakia, both eyes  Z96.1      1. Peripapillary CNV / exudative ARMD OU - 8.1.22 pt with 1 wk history of decreased vision OS and isolated light green flashes OD -- flashes OD resolved, blurriness OS improving  - 10.04.22 small recurrent episode of flashes OD last week.   - 11.15.22 subjectively blurred vision OS - repeat FA (08.23.22) shows mild peripapillary staining and ?leakage OU -- ?CNV  - unclear etiology, but suspect ARMD vs idiopathic CNV -  s/p IVA OS #1 (11.15.22), #2 (12.13.22), #3 (01.10.23), #4 (02.09.23) -- IVA resistance - s/p IVE OS #1 (04.18.23), #2 (05.16.23), #3 (06.13.23), #4 (07.17.23), #5 (08.15.23), #6 (09.13.23), #7 (10.18.23), #8  (11.27.23), #9 (01.04.24), #10 (02.15.24), #11 (03.21.24), #12 (04.25.24), #13 (05.23.24), #14 (07.08.24), #15 (08.15.24) - s/p IVE OD #1 (07.17.23), #2 (08.15.23), #3 (09.13.23), #4 (10.18.23), #5 (11.27.23), #6 (01.04.24), #7 (02.15.24), #8 (03.21.24), #9 (04.25.24), #10 (05.23.24), #11 (07.08.24), #12 (08.15.24) - BCVA 20/20 OU - stable - OCT shows OD: persistent peripapillary IRF/cystic changes, stable improvement in focal SRF superior to disc; OS: Persistent peripapillary IRF nasal macula -- slightly improved; SRF stably resolved at 5.5 weeks - recommend IVE OU (OD #13 and OS #16) today, 09.25.24 with follow up in 6 weeks  - pt wishes to proceed with injection  - RBA of procedure discussed, questions answered - IVE informed consent obtained and signed, 07.17.23 (OU) - IVE informed consent obtained and signed, 09.25.24 (OU) - see procedure note   - cont Amsler grid monitoring  - f/u 6 weeks, DFE, OCT, possible injections  2,3. Hypertensive retinopathy OU - discussed importance of tight BP control - continue to monitor  4. Pseudophakia OU  - s/p CE/IOL (Dr. Vonna Kotyk, 2022)  - IOLs in good position, doing well - continue to monitor  Ophthalmic Meds Ordered this visit:  Meds ordered this encounter  Medications   aflibercept (EYLEA) SOLN 2 mg   aflibercept (EYLEA) SOLN 2 mg     Return in about 6 weeks (around 10/19/2023) for f/u CNV/EX. AMD OU , DFE, OCT, Possible, IVE, OU.  There are no Patient Instructions on file for this visit.  This document serves as a record of services personally performed by Karie Chimera, MD, PhD. It was created on their behalf by De Blanch, an ophthalmic technician. The creation of this record is the provider's dictation and/or activities during the visit.    Electronically signed by: De Blanch, OA, 09/07/23  10:48 AM  This document serves as a record of services personally performed by Karie Chimera, MD, PhD. It was created on their  behalf by Charlette Caffey, COT an ophthalmic technician. The creation of this record is the provider's dictation and/or activities during the visit.    Electronically signed by:  Charlette Caffey, COT  09/07/23 10:48 AM  Karie Chimera, M.D., Ph.D. Diseases & Surgery of the Retina and Vitreous [facility]  I have reviewed the above documentation for accuracy and completeness, and I agree with the above. Karie Chimera, M.D., Ph.D. 09/07/23 10:50 AM   Abbreviations: M myopia (nearsighted); A astigmatism; H hyperopia (farsighted); P presbyopia; Mrx spectacle prescription;  CTL contact lenses; OD right eye; OS left eye; OU both eyes  XT exotropia; ET esotropia; PEK punctate epithelial keratitis; PEE punctate epithelial erosions; DES dry eye syndrome; MGD meibomian gland dysfunction; ATs artificial tears; PFAT's preservative free artificial tears; NSC nuclear sclerotic cataract; PSC posterior subcapsular cataract; ERM epi-retinal membrane; PVD posterior vitreous detachment; RD retinal detachment; DM diabetes mellitus; DR diabetic retinopathy; NPDR non-proliferative diabetic retinopathy; PDR proliferative diabetic retinopathy; CSME clinically significant macular edema; DME diabetic macular edema; dbh dot blot hemorrhages; CWS cotton wool spot; POAG primary open angle glaucoma; C/D cup-to-disc ratio; HVF humphrey visual field; GVF goldmann visual field; OCT optical coherence tomography; IOP intraocular pressure; BRVO Branch retinal vein occlusion; CRVO central retinal vein occlusion; CRAO central retinal artery occlusion; BRAO branch retinal artery occlusion; RT retinal tear; SB  scleral buckle; PPV pars plana vitrectomy; VH Vitreous hemorrhage; PRP panretinal laser photocoagulation; IVK intravitreal kenalog; VMT vitreomacular traction; MH Macular hole;  NVD neovascularization of the disc; NVE neovascularization elsewhere; AREDS age related eye disease study; ARMD age related  macular degeneration; POAG primary open angle glaucoma; EBMD epithelial/anterior basement membrane dystrophy; ACIOL anterior chamber intraocular lens; IOL intraocular lens; PCIOL posterior chamber intraocular lens; Phaco/IOL phacoemulsification with intraocular lens placement; PRK photorefractive keratectomy; LASIK laser assisted in situ keratomileusis; HTN hypertension; DM diabetes mellitus; COPD chronic obstructive pulmonary disease

## 2023-09-07 ENCOUNTER — Encounter (INDEPENDENT_AMBULATORY_CARE_PROVIDER_SITE_OTHER): Payer: Self-pay | Admitting: Ophthalmology

## 2023-09-07 ENCOUNTER — Ambulatory Visit (INDEPENDENT_AMBULATORY_CARE_PROVIDER_SITE_OTHER): Payer: PPO | Admitting: Ophthalmology

## 2023-09-07 DIAGNOSIS — Z961 Presence of intraocular lens: Secondary | ICD-10-CM

## 2023-09-07 DIAGNOSIS — I10 Essential (primary) hypertension: Secondary | ICD-10-CM

## 2023-09-07 DIAGNOSIS — H35033 Hypertensive retinopathy, bilateral: Secondary | ICD-10-CM

## 2023-09-07 DIAGNOSIS — H353231 Exudative age-related macular degeneration, bilateral, with active choroidal neovascularization: Secondary | ICD-10-CM

## 2023-09-07 MED ORDER — AFLIBERCEPT 2MG/0.05ML IZ SOLN FOR KALEIDOSCOPE
2.0000 mg | INTRAVITREAL | Status: AC | PRN
Start: 2023-09-07 — End: 2023-09-07
  Administered 2023-09-07: 2 mg via INTRAVITREAL

## 2023-09-20 DIAGNOSIS — J0101 Acute recurrent maxillary sinusitis: Secondary | ICD-10-CM | POA: Diagnosis not present

## 2023-10-03 NOTE — Progress Notes (Signed)
[facility] [address] [place] [address] Phone: [phone] Subjective:   Bruce Donath, am serving as a scribe for Dr. Antoine Primas.  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  Discussed icing regimen and home exercises, discussed which activities to do and which ones to avoid.  Increase activity slowly.  Patient will be given home exercises.  We discussed potential Graston tool.  Follow-up again in 6 to 8 weeks otherwise     Significant improvement at this moment.  Discussed which activities to do and which ones to avoid.  Increase activity slowly.  Discussed at this moment if worsening can consider another injection but patient responded extremely well.     Updated 10/04/2023 OTHIE PICKAR is a 77 y.o. male coming in with complaint of L elbow and R shoulder pain. Shoulder pain has improved.   L elbow pain is improving. He is able to pick up tea kettle he has less pain.       Past Medical History:  Diagnosis Date   ASCVD (arteriosclerotic cardiovascular disease)    3 vessel   Clotting disorder (HCC)    Esophageal reflux    Hyperlipidemia    Hypertension    Macular degeneration    Past Surgical History:  Procedure Laterality Date   CATARACT EXTRACTION     PTCA  10/14/2003   mid RCA   Social History   Socioeconomic History   Marital status: Married    Spouse name: Not on file   Number of children: Not on file   Years of education: Not on file   Highest education level: Not on file  Occupational History   Not on file  Tobacco Use   Smoking status: Former   Smokeless tobacco: Never  Vaping Use   Vaping status: Never Used  Substance and Sexual Activity   Alcohol use: Yes    Alcohol/week: 1.0 standard drink of alcohol    Types: 1 Glasses of wine per week    Comment: 1-2 per day   Drug use: No    Sexual activity: Yes  Other Topics Concern   Not on file  Social History Narrative   Not on file   Social Determinants of Health   Financial Resource Strain: Not on file  Food Insecurity: Not on file  Transportation Needs: Not on file  Physical Activity: Not on file  Stress: Not on file  Social Connections: Not on file   No Known Allergies Family History  Problem Relation Age of Onset   Heart attack Father    Cancer Mother    Diabetes Sister    Cancer Brother    Healthy Brother      Current Outpatient Medications (Cardiovascular):    atorvastatin (LIPITOR) 80 MG tablet, Take 80 mg by mouth daily.   ezetimibe (ZETIA) 10 MG tablet, Take 10 mg by mouth daily.   lisinopril (PRINIVIL,ZESTRIL) 10 MG tablet, Take 10 mg by mouth daily.   nitroGLYCERIN (NITROSTAT) 0.4 MG SL tablet, Place 1 tablet (0.4 mg total) under the tongue every 5 (five) minutes as needed for chest pain.   sildenafil (VIAGRA) 50 MG tablet, Take 50 mg by mouth daily as needed for erectile dysfunction.  Current Outpatient Medications (Respiratory):    fexofenadine (ALLEGRA) 180 MG tablet, Take 180 mg by mouth daily as needed for allergies.  fluticasone (FLONASE) 50 MCG/ACT nasal spray, Place 1 spray into both nostrils daily. PRN  Current Outpatient Medications (Analgesics):    aspirin 81 MG tablet, Take 81 mg by mouth daily.   IBUPROFEN & ACETAMINOPHEN PO, Take by mouth as needed.   Current Outpatient Medications (Other):    Coenzyme Q10 (CO Q-10) 100 MG CAPS, Take 300 mg by mouth daily.   Mag Aspart-Potassium Aspart (POTASSIUM & MAGNESIUM ASPARTAT) 250-250 MG CAPS, Take 1 tablet by mouth daily.   Multiple Vitamin (MULTIVITAMIN) tablet, Take 1 tablet by mouth daily.   Omega-3 Fatty Acids (FISH OIL) 1000 MG CAPS, Take 2 capsules by mouth daily.   OVER THE COUNTER MEDICATION, Take 1 tablet by mouth daily. Med Name: PROTANDIM   Reviewed prior external information including notes and imaging from   primary care provider As well as notes that were available from care everywhere and other healthcare systems.  Past medical history, social, surgical and family history all reviewed in electronic medical record.  No pertanent information unless stated regarding to the chief complaint.   Review of Systems:  No headache, visual changes, nausea, vomiting, diarrhea, constipation, dizziness, abdominal pain, skin rash, fevers, chills, night sweats, weight loss, swollen lymph nodes, body aches, joint swelling, chest pain, shortness of breath, mood changes. POSITIVE muscle aches  Objective  Blood pressure 104/64, pulse 74, height 5\' 10"  (1.778 m), weight 185 lb (83.9 kg).   General: No apparent distress alert and oriented x3 mood and affect normal, dressed appropriately.  HEENT: Pupils equal, extraocular movements intact  Respiratory: Patient's speak in full sentences and does not appear short of breath  Cardiovascular: No lower extremity edema, non tender, no erythema  Patient left elbow nontender on exam.  No pain with resisted extension of the wrist. Right hip does have some limited range of motion noted more with internal rotation and external rotation.  Negative straight leg test noted.  Limited muscular skeletal ultrasound was performed and interpreted by Antoine Primas, M  Limited ultrasound of patient's left elbow shows that there is no significant hypoechoic changes noted. Tendon appears to be unremarkable. Impression: Normal    Impression and Recommendations:     The above documentation has been reviewed and is accurate and complete Judi Saa, DO

## 2023-10-04 ENCOUNTER — Ambulatory Visit (INDEPENDENT_AMBULATORY_CARE_PROVIDER_SITE_OTHER): Payer: PPO

## 2023-10-04 ENCOUNTER — Ambulatory Visit: Payer: PPO | Admitting: Family Medicine

## 2023-10-04 ENCOUNTER — Other Ambulatory Visit: Payer: Self-pay

## 2023-10-04 ENCOUNTER — Encounter: Payer: Self-pay | Admitting: Family Medicine

## 2023-10-04 VITALS — BP 104/64 | HR 74 | Ht 70.0 in | Wt 185.0 lb

## 2023-10-04 DIAGNOSIS — M25551 Pain in right hip: Secondary | ICD-10-CM | POA: Diagnosis not present

## 2023-10-04 DIAGNOSIS — M160 Bilateral primary osteoarthritis of hip: Secondary | ICD-10-CM | POA: Diagnosis not present

## 2023-10-04 DIAGNOSIS — M25522 Pain in left elbow: Secondary | ICD-10-CM

## 2023-10-04 NOTE — Patient Instructions (Signed)
R hip xray Hip flexor stretches See me in 2-3 months

## 2023-10-04 NOTE — Assessment & Plan Note (Signed)
Right hip exam shows the patient does have unfortunately some limited range of motion noted.  Will get x-rays to further evaluate if there is any significant bony abnormality discussed with patient about different exercises that I think will be beneficial.  Increase activity slowly overall.  Follow-up with me again in 6 to 8 weeks.

## 2023-10-04 NOTE — Assessment & Plan Note (Signed)
Significant improvement noted.  No significant discomfort at this time.  Ultrasound shows no significant swelling either or any hypoechoic changes consistent with a potential tear.  Discussed with patient about icing regimen and home exercises if needed but can follow-up with me as needed.

## 2023-10-07 NOTE — Progress Notes (Signed)
[facility] - Clinic Note  10/19/2023    CHIEF COMPLAINT Patient presents for Retina Follow Up  HISTORY OF PRESENT ILLNESS: Raymond Alexander is a 77 y.o. male who presents to the clinic today for:   HPI     Retina Follow Up   Patient presents with  Wet AMD.  In both eyes.  This started 6 weeks ago.  Duration of 6 weeks.  Since onset it is stable.  I, the attending physician,  performed the HPI with the patient and updated documentation appropriately.        Comments   6 week retina follow up  ARMD /CNV OU and I'VE OU pt is reporting no vision changes noticed he denies any flashes or floaters       Last edited by Rennis Chris, MD on 10/19/2023  9:24 AM.    Pt feels like vision is doing okay   Referring physician: Darrow Bussing, MD [id number] [place],  [place] [address]  HISTORICAL INFORMATION:   Selected notes from the MEDICAL RECORD NUMBER Referred by Dr. Lorin Picket for concern of retinal edema OU   CURRENT MEDICATIONS: No current outpatient medications on file. (Ophthalmic Drugs)   No current facility-administered medications for this visit. (Ophthalmic Drugs)   Current Outpatient Medications (Other)  Medication Sig   aspirin 81 MG tablet Take 81 mg by mouth daily.   atorvastatin (LIPITOR) 80 MG tablet Take 80 mg by mouth daily.   Coenzyme Q10 (CO Q-10) 100 MG CAPS Take 300 mg by mouth daily.   ezetimibe (ZETIA) 10 MG tablet Take 10 mg by mouth daily.   fexofenadine (ALLEGRA) 180 MG tablet Take 180 mg by mouth daily as needed for allergies.    fluticasone (FLONASE) 50 MCG/ACT nasal spray Place 1 spray into both nostrils daily. PRN   IBUPROFEN & ACETAMINOPHEN PO Take by mouth as needed.   lisinopril (PRINIVIL,ZESTRIL) 10 MG tablet Take 10 mg by mouth daily.   Mag Aspart-Potassium Aspart (POTASSIUM & MAGNESIUM ASPARTAT) 250-250 MG CAPS Take 1 tablet by mouth daily.   Multiple Vitamin (MULTIVITAMIN) tablet Take 1 tablet by mouth  daily.   nitroGLYCERIN (NITROSTAT) 0.4 MG SL tablet Place 1 tablet (0.4 mg total) under the tongue every 5 (five) minutes as needed for chest pain.   Omega-3 Fatty Acids (FISH OIL) 1000 MG CAPS Take 2 capsules by mouth daily.   OVER THE COUNTER MEDICATION Take 1 tablet by mouth daily. Med Name: PROTANDIM   sildenafil (VIAGRA) 50 MG tablet Take 50 mg by mouth daily as needed for erectile dysfunction.   No current facility-administered medications for this visit. (Other)   REVIEW OF SYSTEMS: ROS   Positive for: Cardiovascular, Eyes Negative for: Constitutional, Gastrointestinal, Neurological, Skin, Genitourinary, Musculoskeletal, HENT, Endocrine, Respiratory, Psychiatric, Allergic/Imm, Heme/Lymph Last edited by Etheleen Mayhew, COT on 10/19/2023  8:34 AM.     ALLERGIES No Known Allergies  PAST MEDICAL HISTORY Past Medical History:  Diagnosis Date   ASCVD (arteriosclerotic cardiovascular disease)    3 vessel   Clotting disorder (HCC)    Esophageal reflux    Hyperlipidemia    Hypertension    Macular degeneration    Past Surgical History:  Procedure Laterality Date   CATARACT EXTRACTION     PTCA  10/14/2003   mid RCA   FAMILY HISTORY Family History  Problem Relation Age of Onset   Heart attack Father    Cancer Mother    Diabetes Sister  Cancer Brother    Healthy Brother    SOCIAL HISTORY Social History   Tobacco Use   Smoking status: Former   Smokeless tobacco: Never  Vaping Use   Vaping status: Never Used  Substance Use Topics   Alcohol use: Yes    Alcohol/week: 1.0 standard drink of alcohol    Types: 1 Glasses of wine per week    Comment: 1-2 per day   Drug use: No       OPHTHALMIC EXAM:  Base Eye Exam     Visual Acuity (Snellen - Linear)       Right Left   Dist cc 20/20 20/20    Correction: Glasses         Tonometry (Tonopen, 8:38 AM)       Right Left   Pressure 12 12         Pupils       Pupils Dark Light Shape React APD    Right PERRL 3 2 Round Brisk None   Left PERRL 3 2 Round Brisk None         Visual Fields       Left Right    Full Full         Extraocular Movement       Right Left    Full, Ortho Full, Ortho         Neuro/Psych     Oriented x3: Yes   Mood/Affect: Normal         Dilation     Both eyes: 2.5% Phenylephrine @ 8:36 AM           Slit Lamp and Fundus Exam     Slit Lamp Exam       Right Left   Lids/Lashes Normal small stye nasal LL margin   Conjunctiva/Sclera White and quiet White and quiet   Cornea arcus, well healed cataract wound, trace PEE arcus, well healed cataract wound, trace PEE   Anterior Chamber Deep and quiet Deep and quiet   Iris Round and dilated Round and dilated   Lens PC IOL in good position Toric PC IOL in good position with marks at 0530 and 1100   Anterior Vitreous Mild Vitreous syneresis, no cell or pigment, low lying Posterior vitreous detachment, Weiss ring Mild Vitreous syneresis, no cell or pigment, Posterior vitreous detachment         Fundus Exam       Right Left   Disc Pink and Sharp, mild peripapillary edema -- slightly improved, ?peripapillary CNV ST to disc -- persistent, no heme Pink and Sharp, peripapillary cystic changes -- improved, CNV ST disc, no heme   C/D Ratio 0.5 0.4   Macula Good foveal reflex, nasal cystic changes/edema -- slightly improved, fine drusen vs exudate - stably improved, no heme Good foveal reflex, mild edema nasal macula -- improved, no heme   Vessels attenuated, Tortuous attenuated, Tortuous   Periphery Attached, No heme, no RT/RD Attached, No heme            Refraction     Wearing Rx       Sphere Cylinder Axis Add   Right [place] +0.25 060 +2.25   Left +0.25 Sphere  +2.25           IMAGING AND PROCEDURES  Imaging and Procedures for 10/19/2023  OCT, Retina - OU - Both Eyes       Right Eye Quality was good. Central Foveal Thickness: 288. Progression has improved. Findings include  normal foveal contour, no SRF, retinal drusen , intraretinal fluid, pigment epithelial detachment (Mild interval improvement in peripapillary IRF/cystic changes, stable improvement in focal SRF superior to disc).   Left Eye Quality was good. Central Foveal Thickness: 271. Progression has improved. Findings include normal foveal contour, no SRF, retinal drusen , intraretinal fluid (Persistent peripapillary IRF nasal macula -- slightly improved; SRF stably improved).   Notes *Images captured and stored on drive  Diagnosis / Impression:  OD: Mild interval improvement in peripapillary IRF/cystic changes, stable improvement in focal SRF superior to disc OS: Persistent peripapillary IRF nasal macula -- slightly improved; SRF stably resolved  Clinical management:  See below  Abbreviations: NFP - Normal foveal profile. CME - cystoid macular edema. PED - pigment epithelial detachment. IRF - intraretinal fluid. SRF - subretinal fluid. EZ - ellipsoid zone. ERM - epiretinal membrane. ORA - outer retinal atrophy. ORT - outer retinal tubulation. SRHM - subretinal hyper-reflective material. IRHM - intraretinal hyper-reflective material      Intravitreal Injection, Pharmacologic Agent - OD - Right Eye       Time Out 10/19/2023. 8:57 AM. Confirmed correct patient, procedure, site, and patient consented.   Anesthesia Topical anesthesia was used. Anesthetic medications included Lidocaine 2%, Proparacaine 0.5%.   Procedure Preparation included 5% betadine to ocular surface, eyelid speculum. A (32g) needle was used.   Injection: 2 mg aflibercept 2 MG/0.05ML   Route: Intravitreal, Site: Right Eye   NDC: L6038910, Lot: [id number], Expiration date: 10/12/2024, Waste: 0 mL   Post-op Post injection exam found visual acuity of at least counting fingers. The patient tolerated the procedure well. There were no complications. The patient received written and verbal post procedure care education. Post  injection medications were not given.      Intravitreal Injection, Pharmacologic Agent - OS - Left Eye       Time Out 10/19/2023. 8:57 AM. Confirmed correct patient, procedure, site, and patient consented.   Anesthesia Topical anesthesia was used. Anesthetic medications included Lidocaine 2%, Proparacaine 0.5%.   Procedure Preparation included 5% betadine to ocular surface, eyelid speculum. A (32g) needle was used.   Injection: 2 mg aflibercept 2 MG/0.05ML   Route: Intravitreal, Site: Left Eye   NDC: L6038910, Lot: [id number], Expiration date: 08/12/2024, Waste: 0 mL   Post-op Post injection exam found visual acuity of at least counting fingers. The patient tolerated the procedure well. There were no complications. The patient received written and verbal post procedure care education. Post injection medications were not given.            ASSESSMENT/PLAN:    ICD-10-CM   1. 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 Essential hypertension  I10     3. Hypertensive retinopathy of both eyes  H35.033     4. Pseudophakia, both eyes  Z96.1      1. Peripapillary CNV / exudative ARMD OU - 8.1.22 pt with 1 wk history of decreased vision OS and isolated light green flashes OD -- flashes OD resolved, blurriness OS improving  - 10.04.22 small recurrent episode of flashes OD last week.   - 11.15.22 subjectively blurred vision OS - repeat FA (08.23.22) shows mild  peripapillary staining and ?leakage OU -- ?CNV  - unclear etiology, but suspect ARMD vs idiopathic CNV - s/p IVA OS #1 (11.15.22), #2 (12.13.22), #3 (01.10.23), #4 (02.09.23) -- IVA resistance - s/p IVE OS #1 (04.18.23), #2  (05.16.23), #3 (06.13.23), #4 (07.17.23), #5 (08.15.23), #6 (09.13.23), #7 (10.18.23), #8 (11.27.23), #9 (01.04.24), #10 (02.15.24), #11 (03.21.24), #12 (04.25.24), #13 (05.23.24), #14 (07.08.24), #15 (08.15.24), #16 (09.25.24) - s/p IVE OD #1 (07.17.23), #2 (08.15.23), #3 (09.13.23), #4 (10.18.23), #5 (11.27.23), #6 (01.04.24), #7 (02.15.24), #8 (03.21.24), #9 (04.25.24), #10 (05.23.24), #11 (07.08.24), #12 (08.15.24), #13 (09.25.24) - BCVA 20/20 OU - stable - OCT shows OD: Mild interval improvement in peripapillary IRF/cystic changes, stable improvement in focal SRF superior to disc; OS: Persistent peripapillary IRF nasal macula -- slightly improved; SRF stably resolved at 6 wks - recommend IVE OU (OD #14 and OS #17) today, 11.06.24 with follow up in 6 weeks  - pt wishes to proceed with injection  - RBA of procedure discussed, questions answered - IVE informed consent obtained and signed, 09.25.24 (OU) - see procedure note   - cont Amsler grid monitoring  - f/u 6 weeks, DFE, OCT, possible injections, tx and ext as able  2,3. Hypertensive retinopathy OU - discussed importance of tight BP control - continue to monitor  4. Pseudophakia OU  - s/p CE/IOL (Dr. Vonna Kotyk, 2022)  - IOLs in good position, doing well - continue to monitor  Ophthalmic Meds Ordered this visit:  Meds ordered this encounter  Medications   aflibercept (EYLEA) SOLN 2 mg   aflibercept (EYLEA) SOLN 2 mg     Return in about 6 weeks (around 11/30/2023) for f/u exu ARMD OU, DFE, OCT.  There are no Patient Instructions on file for this visit.  This document serves as a record of services personally performed by Karie Chimera, MD, PhD. It was created on their behalf by De Blanch, an ophthalmic technician. The creation of this record is the provider's dictation and/or activities during the visit.    Electronically signed by: De Blanch, OA, 10/20/23  2:54 PM  This document serves as a record of services  personally performed by Karie Chimera, MD, PhD. It was created on their behalf by Glee Arvin. Manson Passey, OA an ophthalmic technician. The creation of this record is the provider's dictation and/or activities during the visit.    Electronically signed by: Glee Arvin. Manson Passey, OA 10/20/23 2:54 PM  Karie Chimera, M.D., Ph.D. Diseases & Surgery of the Retina and Vitreous [facility]  I have reviewed the above documentation for accuracy and completeness, and I agree with the above. Karie Chimera, M.D., Ph.D. 10/20/23 2:56 PM   Abbreviations: M myopia (nearsighted); A astigmatism; H hyperopia (farsighted); P presbyopia; Mrx spectacle prescription;  CTL contact lenses; OD right eye; OS left eye; OU both eyes  XT exotropia; ET esotropia; PEK punctate epithelial keratitis; PEE punctate epithelial erosions; DES dry eye syndrome; MGD meibomian gland dysfunction; ATs artificial tears; PFAT's preservative free artificial tears; NSC nuclear sclerotic cataract; PSC posterior subcapsular cataract; ERM epi-retinal membrane; PVD posterior vitreous detachment; RD retinal detachment; DM diabetes mellitus; DR diabetic retinopathy; NPDR non-proliferative diabetic retinopathy; PDR proliferative diabetic retinopathy; CSME clinically significant macular edema; DME diabetic macular edema; dbh dot blot hemorrhages; CWS cotton wool spot; POAG primary open angle glaucoma; C/D cup-to-disc ratio; HVF humphrey visual field; GVF goldmann visual field; OCT optical coherence tomography; IOP intraocular pressure; BRVO Branch retinal vein occlusion; CRVO central retinal vein  occlusion; CRAO central retinal artery occlusion; BRAO branch retinal artery occlusion; RT retinal tear; SB scleral buckle; PPV pars plana vitrectomy; VH Vitreous hemorrhage; PRP panretinal laser photocoagulation; IVK intravitreal kenalog; VMT vitreomacular traction; MH Macular hole;  NVD neovascularization of the disc; NVE neovascularization  elsewhere; AREDS age related eye disease study; ARMD age related macular degeneration; POAG primary open angle glaucoma; EBMD epithelial/anterior basement membrane dystrophy; ACIOL anterior chamber intraocular lens; IOL intraocular lens; PCIOL posterior chamber intraocular lens; Phaco/IOL phacoemulsification with intraocular lens placement; PRK photorefractive keratectomy; LASIK laser assisted in situ keratomileusis; HTN hypertension; DM diabetes mellitus; COPD chronic obstructive pulmonary disease

## 2023-10-19 ENCOUNTER — Ambulatory Visit (INDEPENDENT_AMBULATORY_CARE_PROVIDER_SITE_OTHER): Payer: PPO | Admitting: Ophthalmology

## 2023-10-19 ENCOUNTER — Encounter (INDEPENDENT_AMBULATORY_CARE_PROVIDER_SITE_OTHER): Payer: Self-pay | Admitting: Ophthalmology

## 2023-10-19 DIAGNOSIS — I10 Essential (primary) hypertension: Secondary | ICD-10-CM

## 2023-10-19 DIAGNOSIS — H353231 Exudative age-related macular degeneration, bilateral, with active choroidal neovascularization: Secondary | ICD-10-CM

## 2023-10-19 DIAGNOSIS — Z961 Presence of intraocular lens: Secondary | ICD-10-CM

## 2023-10-19 DIAGNOSIS — H35033 Hypertensive retinopathy, bilateral: Secondary | ICD-10-CM | POA: Diagnosis not present

## 2023-10-19 MED ORDER — AFLIBERCEPT 2MG/0.05ML IZ SOLN FOR KALEIDOSCOPE
2.0000 mg | INTRAVITREAL | Status: AC | PRN
  Administered 2023-10-19: 2 mg via INTRAVITREAL

## 2023-11-01 DIAGNOSIS — Z23 Encounter for immunization: Secondary | ICD-10-CM | POA: Diagnosis not present

## 2023-11-22 DIAGNOSIS — M542 Cervicalgia: Secondary | ICD-10-CM | POA: Diagnosis not present

## 2023-11-22 DIAGNOSIS — H353231 Exudative age-related macular degeneration, bilateral, with active choroidal neovascularization: Secondary | ICD-10-CM | POA: Diagnosis not present

## 2023-11-22 DIAGNOSIS — I700 Atherosclerosis of aorta: Secondary | ICD-10-CM | POA: Diagnosis not present

## 2023-11-22 DIAGNOSIS — M545 Low back pain, unspecified: Secondary | ICD-10-CM | POA: Diagnosis not present

## 2023-11-24 NOTE — Progress Notes (Signed)
[facility] - Clinic Note  11/30/2023    CHIEF COMPLAINT Patient presents for Retina Follow Up  HISTORY OF PRESENT ILLNESS: Raymond Alexander is a 77 y.o. male who presents to the clinic today for:   HPI     Retina Follow Up   Patient presents with  Dry AMD.  In both eyes.  This started 6 weeks ago.  Duration of 6 weeks.  Since onset it is stable.  I, the attending physician,  performed the HPI with the patient and updated documentation appropriately.        Comments   6 week retina follow up ARMD OU and I'VE OU pt is reporting no vision changes noticed he denies any floaters may have had some flashes in the right eye        Last edited by Rennis Chris, MD on 11/30/2023 12:36 PM.     Referring physician: Darrow Bussing, MD [address] [place],  [place] [address]  HISTORICAL INFORMATION:   Selected notes from the MEDICAL RECORD NUMBER Referred by Dr. Lorin Picket for concern of retinal edema OU   CURRENT MEDICATIONS: No current outpatient medications on file. (Ophthalmic Drugs)   No current facility-administered medications for this visit. (Ophthalmic Drugs)   Current Outpatient Medications (Other)  Medication Sig   aspirin 81 MG tablet Take 81 mg by mouth daily.   atorvastatin (LIPITOR) 80 MG tablet Take 80 mg by mouth daily.   Coenzyme Q10 (CO Q-10) 100 MG CAPS Take 300 mg by mouth daily.   ezetimibe (ZETIA) 10 MG tablet Take 10 mg by mouth daily.   fexofenadine (ALLEGRA) 180 MG tablet Take 180 mg by mouth daily as needed for allergies.    fluticasone (FLONASE) 50 MCG/ACT nasal spray Place 1 spray into both nostrils daily. PRN   IBUPROFEN & ACETAMINOPHEN PO Take by mouth as needed.   lisinopril (PRINIVIL,ZESTRIL) 10 MG tablet Take 10 mg by mouth daily.   Mag Aspart-Potassium Aspart (POTASSIUM & MAGNESIUM ASPARTAT) 250-250 MG CAPS Take 1 tablet by mouth daily.   Multiple Vitamin (MULTIVITAMIN) tablet Take 1 tablet by mouth daily.    nitroGLYCERIN (NITROSTAT) 0.4 MG SL tablet Place 1 tablet (0.4 mg total) under the tongue every 5 (five) minutes as needed for chest pain.   Omega-3 Fatty Acids (FISH OIL) 1000 MG CAPS Take 2 capsules by mouth daily.   OVER THE COUNTER MEDICATION Take 1 tablet by mouth daily. Med Name: PROTANDIM   sildenafil (VIAGRA) 50 MG tablet Take 50 mg by mouth daily as needed for erectile dysfunction.   No current facility-administered medications for this visit. (Other)   REVIEW OF SYSTEMS: ROS   Positive for: Cardiovascular, Eyes Negative for: Constitutional, Gastrointestinal, Neurological, Skin, Genitourinary, Musculoskeletal, HENT, Endocrine, Respiratory, Psychiatric, Allergic/Imm, Heme/Lymph Last edited by Etheleen Mayhew, COT on 11/30/2023  9:00 AM.     ALLERGIES No Known Allergies  PAST MEDICAL HISTORY Past Medical History:  Diagnosis Date   ASCVD (arteriosclerotic cardiovascular disease)    3 vessel   Clotting disorder (HCC)    Esophageal reflux    Hyperlipidemia    Hypertension    Macular degeneration    Past Surgical History:  Procedure Laterality Date   CATARACT EXTRACTION     PTCA  10/14/2003   mid RCA   FAMILY HISTORY Family History  Problem Relation Age of Onset   Heart attack Father    Cancer Mother    Diabetes Sister    Cancer  Brother    Healthy Brother    SOCIAL HISTORY Social History   Tobacco Use   Smoking status: Former   Smokeless tobacco: Never  Advertising account planner   Vaping status: Never Used  Substance Use Topics   Alcohol use: Yes    Alcohol/week: 1.0 standard drink of alcohol    Types: 1 Glasses of wine per week    Comment: 1-2 per day   Drug use: No       OPHTHALMIC EXAM:  Base Eye Exam     Visual Acuity (Snellen - Linear)       Right Left   Dist cc 20/20 -1 20/20 -1         Tonometry (Tonopen, 9:07 AM)       Right Left   Pressure 10 12         Pupils       Pupils Dark Light Shape React APD   Right PERRL 3 2 Round Brisk  None   Left PERRL 3 2 Round Brisk None         Visual Fields       Left Right    Full Full         Extraocular Movement       Right Left    Full, Ortho Full, Ortho         Neuro/Psych     Oriented x3: Yes   Mood/Affect: Normal         Dilation     Both eyes: 2.5% Phenylephrine @ 9:07 AM           Slit Lamp and Fundus Exam     Slit Lamp Exam       Right Left   Lids/Lashes Normal small stye nasal LL margin   Conjunctiva/Sclera White and quiet White and quiet   Cornea arcus, well healed cataract wound, trace PEE arcus, well healed cataract wound, trace PEE   Anterior Chamber Deep and quiet Deep and quiet   Iris Round and dilated Round and dilated   Lens PC IOL in good position Toric PC IOL in good position with marks at 0530 and 1100   Anterior Vitreous Mild Vitreous syneresis, no cell or pigment, low lying Posterior vitreous detachment, Weiss ring Mild Vitreous syneresis, no cell or pigment, Posterior vitreous detachment         Fundus Exam       Right Left   Disc Pink and Sharp, mild peripapillary edema -- slightly improved, ?peripapillary CNV ST to disc -- persistent, no heme Pink and Sharp, peripapillary cystic changes -- improved, CNV ST disc, no heme   C/D Ratio 0.5 0.4   Macula Good foveal reflex, nasal cystic changes/edema -- slightly improved, fine drusen vs exudate - stably improved, no heme Good foveal reflex, mild edema nasal macula -- persistent, no heme   Vessels attenuated, Tortuous attenuated, Tortuous   Periphery Attached, No heme, no RT/RD Attached, No heme            Refraction     Wearing Rx       Sphere Cylinder Axis Add   Right [place] +0.25 060 +2.25   Left +0.25 Sphere  +2.25           IMAGING AND PROCEDURES  Imaging and Procedures for 11/30/2023  OCT, Retina - OU - Both Eyes       Right Eye Quality was good. Central Foveal Thickness: 287. Progression has improved. Findings include normal foveal contour, no  SRF, retinal drusen , intraretinal fluid, pigment epithelial detachment (Mild interval improvement in peripapillary IRF/cystic changes, stable improvement in focal SRF superior to disc).   Left Eye Quality was good. Central Foveal Thickness: 272. Progression has been stable. Findings include normal foveal contour, no SRF, retinal drusen , intraretinal fluid (Persistent peripapillary IRF nasal macula; SRF stably improved).   Notes *Images captured and stored on drive  Diagnosis / Impression:  OD: Mild interval improvement in peripapillary IRF/cystic changes, stable improvement in focal SRF superior to disc OS: Persistent peripapillary IRF nasal macula; SRF stably resolved  Clinical management:  See below  Abbreviations: NFP - Normal foveal profile. CME - cystoid macular edema. PED - pigment epithelial detachment. IRF - intraretinal fluid. SRF - subretinal fluid. EZ - ellipsoid zone. ERM - epiretinal membrane. ORA - outer retinal atrophy. ORT - outer retinal tubulation. SRHM - subretinal hyper-reflective material. IRHM - intraretinal hyper-reflective material      Intravitreal Injection, Pharmacologic Agent - OD - Right Eye       Time Out 11/30/2023. 10:18 AM. Confirmed correct patient, procedure, site, and patient consented.   Anesthesia Topical anesthesia was used. Anesthetic medications included Lidocaine 2%, Proparacaine 0.5%.   Procedure Preparation included 5% betadine to ocular surface, eyelid speculum. A (32g) needle was used.   Injection: 2 mg aflibercept 2 MG/0.05ML   Route: Intravitreal, Site: Right Eye   NDC: L6038910, Lot: [id number], Expiration date: 04/10/2025, Waste: 0 mL   Post-op Post injection exam found visual acuity of at least counting fingers. The patient tolerated the procedure well. There were no complications. The patient received written and verbal post procedure care education. Post injection medications were not given.      Intravitreal  Injection, Pharmacologic Agent - OS - Left Eye       Time Out 11/30/2023. 10:18 AM. Confirmed correct patient, procedure, site, and patient consented.   Anesthesia Topical anesthesia was used. Anesthetic medications included Lidocaine 2%, Proparacaine 0.5%.   Procedure Preparation included 5% betadine to ocular surface, eyelid speculum. A (32g) needle was used.   Injection: 2 mg aflibercept 2 MG/0.05ML   Route: Intravitreal, Site: Left Eye   NDC: L6038910, Lot: [id number], Expiration date: 05/12/2024, Waste: 0 mL   Post-op Post injection exam found visual acuity of at least counting fingers. The patient tolerated the procedure well. There were no complications. The patient received written and verbal post procedure care education. Post injection medications were not given.            ASSESSMENT/PLAN:    ICD-10-CM   1. 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 Essential hypertension  I10     3. Hypertensive retinopathy of both eyes  H35.033     4. Pseudophakia, both eyes  Z96.1       1. Peripapillary CNV / exudative ARMD OU - 8.1.22 pt with 1 wk history of decreased vision OS and isolated light green flashes OD -- flashes OD resolved, blurriness OS improving  - 10.04.22 small recurrent episode of flashes OD last week.   - 11.15.22 subjectively blurred vision OS - repeat FA (08.23.22) shows mild peripapillary staining and ?leakage OU -- ?CNV  -  unclear etiology, but suspect ARMD vs idiopathic CNV - s/p IVA OS #1 (11.15.22), #2 (12.13.22), #3 (01.10.23), #4 (02.09.23) -- IVA resistance - s/p IVE OS #1 (04.18.23), #2 (05.16.23), #3 (06.13.23), #4 (07.17.23), #5 (08.15.23), #6  (09.13.23), #7 (10.18.23), #8 (11.27.23), #9 (01.04.24), #10 (02.15.24), #11 (03.21.24), #12 (04.25.24), #13 (05.23.24), #14 (07.08.24), #15 (08.15.24), #16 (09.25.24), #17 (11.06.24) - s/p IVE OD #1 (07.17.23), #2 (08.15.23), #3 (09.13.23), #4 (10.18.23), #5 (11.27.23), #6 (01.04.24), #7 (02.15.24), #8 (03.21.24), #9 (04.25.24), #10 (05.23.24), #11 (07.08.24), #12 (08.15.24), #13 (09.25.24), #14 (11.06.24) - BCVA 20/20 OU - stable - OCT shows OD: Mild interval improvement in peripapillary IRF/cystic changes, stable improvement in focal SRF superior to disc; OS: Persistent peripapillary IRF nasal macula; SRF stably resolved at 6 wks - recommend IVE OU (OD #15 and OS #18) today, 11.06.24 with follow up in 6 weeks  - pt wishes to proceed with injection  - RBA of procedure discussed, questions answered - IVE informed consent obtained and signed, 09.25.24 (OU) - see procedure note   - cont Amsler grid monitoring  - f/u 6 weeks, DFE, OCT, possible injections, tx and ext as able  2,3. Hypertensive retinopathy OU - discussed importance of tight BP control - continue to monitor  4. Pseudophakia OU  - s/p CE/IOL (Dr. Vonna Kotyk, 2022)  - IOLs in good position, doing well - continue to monitor  Ophthalmic Meds Ordered this visit:  Meds ordered this encounter  Medications   aflibercept (EYLEA) SOLN 2 mg   aflibercept (EYLEA) SOLN 2 mg     Return in about 6 weeks (around 01/11/2024) for f/u exu ARMD OU, DFE, OCT.  There are no Patient Instructions on file for this visit.  This document serves as a record of services personally performed by Karie Chimera, MD, PhD. It was created on their behalf by De Blanch, an ophthalmic technician. The creation of this record is the provider's dictation and/or activities during the visit.    Electronically signed by: De Blanch, OA, 11/30/23  12:37 PM  This document serves as a record of services personally performed by Karie Chimera, MD, PhD. It  was created on their behalf by Glee Arvin. Manson Passey, OA an ophthalmic technician. The creation of this record is the provider's dictation and/or activities during the visit.    Electronically signed by: Glee Arvin. Manson Passey, OA 11/30/23 12:37 PM   Karie Chimera, M.D., Ph.D. Diseases & Surgery of the Retina and Vitreous [facility]  I have reviewed the above documentation for accuracy and completeness, and I agree with the above. Karie Chimera, M.D., Ph.D. 11/30/23 12:40 PM\  Abbreviations: M myopia (nearsighted); A astigmatism; H hyperopia (farsighted); P presbyopia; Mrx spectacle prescription;  CTL contact lenses; OD right eye; OS left eye; OU both eyes  XT exotropia; ET esotropia; PEK punctate epithelial keratitis; PEE punctate epithelial erosions; DES dry eye syndrome; MGD meibomian gland dysfunction; ATs artificial tears; PFAT's preservative free artificial tears; NSC nuclear sclerotic cataract; PSC posterior subcapsular cataract; ERM epi-retinal membrane; PVD posterior vitreous detachment; RD retinal detachment; DM diabetes mellitus; DR diabetic retinopathy; NPDR non-proliferative diabetic retinopathy; PDR proliferative diabetic retinopathy; CSME clinically significant macular edema; DME diabetic macular edema; dbh dot blot hemorrhages; CWS cotton wool spot; POAG primary open angle glaucoma; C/D cup-to-disc ratio; HVF humphrey visual field; GVF goldmann visual field; OCT optical coherence tomography; IOP intraocular pressure; BRVO Branch retinal vein occlusion; CRVO central retinal vein occlusion; CRAO central retinal artery occlusion; BRAO branch  retinal artery occlusion; RT retinal tear; SB scleral buckle; PPV pars plana vitrectomy; VH Vitreous hemorrhage; PRP panretinal laser photocoagulation; IVK intravitreal kenalog; VMT vitreomacular traction; MH Macular hole;  NVD neovascularization of the disc; NVE neovascularization elsewhere; AREDS age related eye disease study; ARMD age  related macular degeneration; POAG primary open angle glaucoma; EBMD epithelial/anterior basement membrane dystrophy; ACIOL anterior chamber intraocular lens; IOL intraocular lens; PCIOL posterior chamber intraocular lens; Phaco/IOL phacoemulsification with intraocular lens placement; PRK photorefractive keratectomy; LASIK laser assisted in situ keratomileusis; HTN hypertension; DM diabetes mellitus; COPD chronic obstructive pulmonary disease

## 2023-11-25 DIAGNOSIS — M542 Cervicalgia: Secondary | ICD-10-CM | POA: Diagnosis not present

## 2023-11-25 DIAGNOSIS — M545 Low back pain, unspecified: Secondary | ICD-10-CM | POA: Diagnosis not present

## 2023-11-30 ENCOUNTER — Encounter (INDEPENDENT_AMBULATORY_CARE_PROVIDER_SITE_OTHER): Payer: Self-pay | Admitting: Ophthalmology

## 2023-11-30 ENCOUNTER — Ambulatory Visit (INDEPENDENT_AMBULATORY_CARE_PROVIDER_SITE_OTHER): Payer: PPO | Admitting: Ophthalmology

## 2023-11-30 DIAGNOSIS — I10 Essential (primary) hypertension: Secondary | ICD-10-CM

## 2023-11-30 DIAGNOSIS — H353231 Exudative age-related macular degeneration, bilateral, with active choroidal neovascularization: Secondary | ICD-10-CM | POA: Diagnosis not present

## 2023-11-30 DIAGNOSIS — H35033 Hypertensive retinopathy, bilateral: Secondary | ICD-10-CM | POA: Diagnosis not present

## 2023-11-30 DIAGNOSIS — Z961 Presence of intraocular lens: Secondary | ICD-10-CM | POA: Diagnosis not present

## 2023-11-30 MED ORDER — AFLIBERCEPT 2MG/0.05ML IZ SOLN FOR KALEIDOSCOPE
2.0000 mg | INTRAVITREAL | Status: AC | PRN
  Administered 2023-11-30: 2 mg via INTRAVITREAL

## 2023-12-15 NOTE — Progress Notes (Signed)
 [facility] [address] [place] [address] Phone: [phone] Subjective:   Raymond Alexander, am serving as a scribe for Dr. Arthea Claudene.  I'm seeing this patient by the request  of:  Koirala, Dibas, MD  CC: Elbow and hip pain this would be a follow-up  YEP:Dlagzrupcz  10/04/2023 Right hip exam shows the patient does have unfortunately some limited range of motion noted. Will get x-rays to further evaluate if there is any significant bony abnormality discussed with patient about different exercises that I think will be beneficial. Increase activity slowly overall. Follow-up with me again in 6 to 8 weeks.   Significant improvement noted.  No significant discomfort at this time.  Ultrasound shows no significant swelling either or any hypoechoic changes consistent with a potential tear.  Discussed with patient about icing regimen and home exercises if needed but can follow-up with me as needed.     Updated 12/20/2023 Raymond Alexander is a 78 y.o. male coming in with complaint of L elbow, R shoulder and R hip pain. Patient states that his elbow is doing a lot better.   R shoulder pain started again in 2 Saturdays ago. Pain radiates into the cervical spine. Intensity has not decreased in past few weeks.   No change in R hip pain. Would like to discuss R hip xray.        Past Medical History:  Diagnosis Date   ASCVD (arteriosclerotic cardiovascular disease)    3 vessel   Clotting disorder (HCC)    Esophageal reflux    Hyperlipidemia    Hypertension    Macular degeneration    Past Surgical History:  Procedure Laterality Date   CATARACT EXTRACTION     PTCA  10/14/2003   mid RCA   Social History   Socioeconomic History   Marital status: Married    Spouse name: Not on file   Number of children: Not on file   Years of education: Not on file   Highest education level: Not on file  Occupational History   Not on file  Tobacco Use   Smoking  status: Former   Smokeless tobacco: Never  Vaping Use   Vaping status: Never Used  Substance and Sexual Activity   Alcohol use: Yes    Alcohol/week: 1.0 standard drink of alcohol    Types: 1 Glasses of wine per week    Comment: 1-2 per day   Drug use: No   Sexual activity: Yes  Other Topics Concern   Not on file  Social History Narrative   Not on file   Social Drivers of Health   Financial Resource Strain: Not on file  Food Insecurity: Not on file  Transportation Needs: Not on file  Physical Activity: Not on file  Stress: Not on file  Social Connections: Not on file   No Known Allergies Family History  Problem Relation Age of Onset   Heart attack Father    Cancer Mother    Diabetes Sister    Cancer Brother    Healthy Brother      Current Outpatient Medications (Cardiovascular):    atorvastatin (LIPITOR) 80 MG tablet, Take 80 mg by mouth daily.   ezetimibe (ZETIA) 10 MG tablet, Take 10 mg by mouth daily.   lisinopril (PRINIVIL,ZESTRIL) 10 MG tablet, Take 10 mg by mouth daily.   nitroGLYCERIN  (NITROSTAT ) 0.4 MG SL tablet, Place 1 tablet (0.4 mg total) under the tongue every 5 (five) minutes as  needed for chest pain.   sildenafil (VIAGRA) 50 MG tablet, Take 50 mg by mouth daily as needed for erectile dysfunction.  Current Outpatient Medications (Respiratory):    fexofenadine (ALLEGRA) 180 MG tablet, Take 180 mg by mouth daily as needed for allergies.    fluticasone (FLONASE) 50 MCG/ACT nasal spray, Place 1 spray into both nostrils daily. PRN  Current Outpatient Medications (Analgesics):    aspirin 81 MG tablet, Take 81 mg by mouth daily.   IBUPROFEN & ACETAMINOPHEN PO, Take by mouth as needed.   Current Outpatient Medications (Other):    Coenzyme Q10 (CO Q-10) 100 MG CAPS, Take 300 mg by mouth daily.   Mag Aspart-Potassium Aspart (POTASSIUM & MAGNESIUM ASPARTAT) 250-250 MG CAPS, Take 1 tablet by mouth daily.   Multiple Vitamin (MULTIVITAMIN) tablet, Take 1 tablet  by mouth daily.   Omega-3 Fatty Acids (FISH OIL) 1000 MG CAPS, Take 2 capsules by mouth daily.   OVER THE COUNTER MEDICATION, Take 1 tablet by mouth daily. Med Name: PROTANDIM   Reviewed prior external information including notes and imaging from  primary care provider As well as notes that were available from care everywhere and other healthcare systems.  Past medical history, social, surgical and family history all reviewed in electronic medical record.  No pertanent information unless stated regarding to the chief complaint.   Review of Systems:  No headache, visual changes, nausea, vomiting, diarrhea, constipation, dizziness, abdominal pain, skin rash, fevers, chills, night sweats, weight loss, swollen lymph nodes, body aches, joint swelling, chest pain, shortness of breath, mood changes. POSITIVE muscle aches,   Objective  Blood pressure 130/60, pulse 61, height 5' 10 (1.778 m), weight 187 lb (84.8 kg), SpO2 97%.   General: No apparent distress alert and oriented x3 mood and affect normal, dressed appropriately.  HEENT: Pupils equal, extraocular movements intact  Respiratory: Patient's speak in full sentences and does not appear short of breath  Cardiovascular: No lower extremity edema, non tender, no erythema  Right shoulder exam shows positive crossover exam noted.  Tenderness over the acromioclavicular joint.  Procedure: Real-time Ultrasound Guided Injection of right acromioclavicular joint Device: GE Logiq Q7 Ultrasound guided injection is preferred based studies that show increased duration, increased effect, greater accuracy, decreased procedural pain, increased response rate, and decreased cost with ultrasound guided versus blind injection.  Verbal informed consent obtained.  Time-out conducted.  Noted no overlying erythema, induration, or other signs of local infection.  Skin prepped in a sterile fashion.  Local anesthesia: Topical Ethyl chloride.  With sterile technique  and under real time ultrasound guidance: With a 25-gauge half inch needle injected with 0.5 cc of 0.5% Marcaine and 0.5 cc of Kenalog 40 mg/mL Completed without difficulty  Pain immediately resolved suggesting accurate placement of the medication.  Advised to call if fevers/chills, erythema, induration, drainage, or persistent bleeding.  Impression: Technically successful ultrasound guided injection.   Impression and Recommendations:    The above documentation has been reviewed and is accurate and complete Tnia Anglada M Vaughn Frieze, DO

## 2023-12-20 ENCOUNTER — Other Ambulatory Visit: Payer: Self-pay

## 2023-12-20 ENCOUNTER — Ambulatory Visit: Payer: PPO | Admitting: Family Medicine

## 2023-12-20 ENCOUNTER — Encounter: Payer: Self-pay | Admitting: Family Medicine

## 2023-12-20 VITALS — BP 130/60 | HR 61 | Ht 70.0 in | Wt 187.0 lb

## 2023-12-20 DIAGNOSIS — M19011 Primary osteoarthritis, right shoulder: Secondary | ICD-10-CM

## 2023-12-20 DIAGNOSIS — M25522 Pain in left elbow: Secondary | ICD-10-CM | POA: Diagnosis not present

## 2023-12-20 DIAGNOSIS — M160 Bilateral primary osteoarthritis of hip: Secondary | ICD-10-CM | POA: Insufficient documentation

## 2023-12-20 NOTE — Patient Instructions (Signed)
 Start exercises 3x a week Hands in peripheral vision Hip is arthritis ok if can do things you enjoy See me again in 3 months

## 2023-12-20 NOTE — Assessment & Plan Note (Addendum)
 Symptomatic on the right side.  Discussed icing regimen and home exercises, discussed which activities to do and which ones to avoid.  Increase activity slowly.  Follow-up again in 6 to 8 weeks with worsening symptoms noted then will consider surgery

## 2023-12-20 NOTE — Assessment & Plan Note (Signed)
 Injection given again.  Chronic problem with exacerbation.  Hopefully patient with significant improvement.  Discussed icing regimen.  Worsening symptoms would need to consider surgical intervention but I do not think it will be necessary.  Follow-up again in 2 to 3 months

## 2023-12-28 ENCOUNTER — Other Ambulatory Visit (HOSPITAL_COMMUNITY): Payer: Self-pay | Admitting: Family Medicine

## 2023-12-28 DIAGNOSIS — R1319 Other dysphagia: Secondary | ICD-10-CM

## 2024-01-03 NOTE — Progress Notes (Signed)
[facility] - Clinic Note  01/11/2024    CHIEF COMPLAINT Patient presents for Retina Follow Up  HISTORY OF PRESENT ILLNESS: Raymond Alexander is a 78 y.o. male who presents to the clinic today for:   HPI     Retina Follow Up   Patient presents with  Dry AMD.  In both eyes.  This started 6 weeks ago.  Duration of 6 weeks.  Since onset it is stable.  I, the attending physician,  performed the HPI with the patient and updated documentation appropriately.        Comments   Patient feels the vision is the same. He is using AT's PRN.      Last edited by Rennis Chris, MD on 01/11/2024  8:52 AM.    Pt states vision is stable  Referring physician: Darrow Bussing, MD [address] [place],  [place] [address]  HISTORICAL INFORMATION:   Selected notes from the MEDICAL RECORD NUMBER Referred by Dr. Lorin Picket for concern of retinal edema OU   CURRENT MEDICATIONS: No current outpatient medications on file. (Ophthalmic Drugs)   No current facility-administered medications for this visit. (Ophthalmic Drugs)   Current Outpatient Medications (Other)  Medication Sig   aspirin 81 MG tablet Take 81 mg by mouth daily.   atorvastatin (LIPITOR) 80 MG tablet Take 80 mg by mouth daily.   Coenzyme Q10 (CO Q-10) 100 MG CAPS Take 300 mg by mouth daily.   ezetimibe (ZETIA) 10 MG tablet Take 10 mg by mouth daily.   fexofenadine (ALLEGRA) 180 MG tablet Take 180 mg by mouth daily as needed for allergies.    fluticasone (FLONASE) 50 MCG/ACT nasal spray Place 1 spray into both nostrils daily. PRN   IBUPROFEN & ACETAMINOPHEN PO Take by mouth as needed.   lisinopril (PRINIVIL,ZESTRIL) 10 MG tablet Take 10 mg by mouth daily.   Mag Aspart-Potassium Aspart (POTASSIUM & MAGNESIUM ASPARTAT) 250-250 MG CAPS Take 1 tablet by mouth daily.   Multiple Vitamin (MULTIVITAMIN) tablet Take 1 tablet by mouth daily.   nitroGLYCERIN (NITROSTAT) 0.4 MG SL tablet Place 1 tablet (0.4 mg total)  under the tongue every 5 (five) minutes as needed for chest pain.   Omega-3 Fatty Acids (FISH OIL) 1000 MG CAPS Take 2 capsules by mouth daily.   OVER THE COUNTER MEDICATION Take 1 tablet by mouth daily. Med Name: PROTANDIM   sildenafil (VIAGRA) 50 MG tablet Take 50 mg by mouth daily as needed for erectile dysfunction.   No current facility-administered medications for this visit. (Other)   REVIEW OF SYSTEMS: ROS   Positive for: Cardiovascular, Eyes Negative for: Constitutional, Gastrointestinal, Neurological, Skin, Genitourinary, Musculoskeletal, HENT, Endocrine, Respiratory, Psychiatric, Allergic/Imm, Heme/Lymph Last edited by Charlette Caffey, COT on 01/11/2024  8:24 AM.     ALLERGIES No Known Allergies  PAST MEDICAL HISTORY Past Medical History:  Diagnosis Date   ASCVD (arteriosclerotic cardiovascular disease)    3 vessel   Clotting disorder (HCC)    Esophageal reflux    Hyperlipidemia    Hypertension    Macular degeneration    Past Surgical History:  Procedure Laterality Date   CATARACT EXTRACTION     PTCA  10/14/2003   mid RCA   FAMILY HISTORY Family History  Problem Relation Age of Onset   Heart attack Father    Cancer Mother    Diabetes Sister    Cancer Brother    Healthy Brother    SOCIAL HISTORY Social History  Tobacco Use   Smoking status: Former   Smokeless tobacco: Never  Vaping Use   Vaping status: Never Used  Substance Use Topics   Alcohol use: Yes    Alcohol/week: 1.0 standard drink of alcohol    Types: 1 Glasses of wine per week    Comment: 1-2 per day   Drug use: No       OPHTHALMIC EXAM:  Base Eye Exam     Visual Acuity (Snellen - Linear)       Right Left   Dist cc 20/20 20/20    Correction: Glasses         Tonometry (Tonopen, 8:27 AM)       Right Left   Pressure 11 11         Pupils       Dark Light Shape React APD   Right 3 2 Round Brisk None   Left 3 2 Round Brisk None         Visual Fields        Left Right    Full Full         Extraocular Movement       Right Left    Full, Ortho Full, Ortho         Neuro/Psych     Oriented x3: Yes   Mood/Affect: Normal         Dilation     Both eyes: 1.0% Mydriacyl, 2.5% Phenylephrine @ 8:25 AM           Slit Lamp and Fundus Exam     Slit Lamp Exam       Right Left   Lids/Lashes Normal small stye nasal LL margin   Conjunctiva/Sclera White and quiet White and quiet   Cornea arcus, well healed cataract wound, trace PEE arcus, well healed cataract wound, trace PEE   Anterior Chamber Deep and quiet Deep and quiet   Iris Round and dilated Round and dilated   Lens PC IOL in good position Toric PC IOL in good position with marks at 0530 and 1100   Anterior Vitreous Mild Vitreous syneresis, no cell or pigment, low lying Posterior vitreous detachment, Weiss ring Mild Vitreous syneresis, no cell or pigment, Posterior vitreous detachment         Fundus Exam       Right Left   Disc Pink and Sharp, mild peripapillary edema, ?peripapillary CNV ST to disc -- persistent, no heme Pink and Sharp, peripapillary cystic changes, CNV ST disc, no heme   C/D Ratio 0.5 0.4   Macula Good foveal reflex, nasal cystic changes/edema -- slightly improved, fine drusen vs exudate -- stably improved, no heme Good foveal reflex, mild edema nasal macula -- persistent, no heme   Vessels attenuated, Tortuous attenuated, Tortuous   Periphery Attached, No heme, no RT/RD Attached, No heme            Refraction     Wearing Rx       Sphere Cylinder Axis Add   Right [place] +0.25 060 +2.25   Left +0.25 Sphere  +2.25           IMAGING AND PROCEDURES  Imaging and Procedures for 01/11/2024  OCT, Retina - OU - Both Eyes       Right Eye Quality was good. Central Foveal Thickness: 286. Progression has been stable. Findings include normal foveal contour, no SRF, retinal drusen , intraretinal fluid, pigment epithelial detachment (Persistent  peripapillary IRF/cystic changes, stable improvement in focal  SRF superior to disc).   Left Eye Quality was good. Central Foveal Thickness: 269. Progression has been stable. Findings include normal foveal contour, no SRF, retinal drusen , intraretinal fluid (Persistent peripapillary IRF nasal macula; SRF stably improved).   Notes *Images captured and stored on drive  Diagnosis / Impression:  OD: Persistent peripapillary IRF/cystic changes, stable improvement in focal SRF superior to disc OS: Persistent peripapillary IRF nasal macula; SRF stably resolved  Clinical management:  See below  Abbreviations: NFP - Normal foveal profile. CME - cystoid macular edema. PED - pigment epithelial detachment. IRF - intraretinal fluid. SRF - subretinal fluid. EZ - ellipsoid zone. ERM - epiretinal membrane. ORA - outer retinal atrophy. ORT - outer retinal tubulation. SRHM - subretinal hyper-reflective material. IRHM - intraretinal hyper-reflective material      Intravitreal Injection, Pharmacologic Agent - OD - Right Eye       Time Out 01/11/2024. 9:11 AM. Confirmed correct patient, procedure, site, and patient consented.   Anesthesia Topical anesthesia was used. Anesthetic medications included Lidocaine 2%, Proparacaine 0.5%.   Procedure Preparation included 5% betadine to ocular surface, eyelid speculum. A (32g) needle was used.   Injection: 1.25 mg Bevacizumab 1.25mg /0.56ml   Route: Intravitreal, Site: Right Eye   NDC: P3213405, Lot: [id number], Expiration date: 02/10/2024   Post-op Post injection exam found visual acuity of at least counting fingers. The patient tolerated the procedure well. There were no complications. The patient received written and verbal post procedure care education. Post injection medications were not given.      Intravitreal Injection, Pharmacologic Agent - OS - Left Eye       Time Out 01/11/2024. 9:11 AM. Confirmed correct patient, procedure, site, and patient  consented.   Anesthesia Topical anesthesia was used. Anesthetic medications included Lidocaine 2%, Proparacaine 0.5%.   Procedure Preparation included 5% betadine to ocular surface, eyelid speculum. A supplied (32g) needle was used.   Injection: 1.25 mg Bevacizumab 1.25mg /0.48ml   Route: Intravitreal, Site: Left Eye   NDC: P3213405, Lot: [id number], Expiration date: 02/25/2024   Post-op Post injection exam found visual acuity of at least counting fingers. The patient tolerated the procedure well. There were no complications. The patient received written and verbal post procedure care education. Post injection medications were not given.            ASSESSMENT/PLAN:    ICD-10-CM   1. Exudative age-related macular degeneration of both eyes with active choroidal neovascularization (HCC)  H35.3231 OCT, Retina - OU - Both Eyes    Intravitreal Injection, Pharmacologic Agent - OD - Right Eye    Intravitreal Injection, Pharmacologic Agent - OS - Left Eye    Bevacizumab (AVASTIN) SOLN 1.25 mg    Bevacizumab (AVASTIN) SOLN 1.25 mg    2. Essential hypertension  I10     3. Hypertensive retinopathy of both eyes  H35.033     4. Pseudophakia, both eyes  Z96.1      1. Peripapillary CNV / exudative ARMD OU - 8.1.22 pt with 1 wk history of decreased vision OS and isolated light green flashes OD -- flashes OD resolved, blurriness OS improving  - 10.04.22 small recurrent episode of flashes OD last week.   - 11.15.22 subjectively blurred vision OS - repeat FA (08.23.22) shows mild peripapillary staining and ?leakage OU -- ?CNV  - unclear etiology, but suspect ARMD vs idiopathic CNV - s/p IVA OS #1 (11.15.22), #2 (12.13.22), #3 (01.10.23), #4 (02.09.23) -- IVA resistance - s/p IVE OS #1 (04.18.23), #2 (  05.16.23), #3 (06.13.23), #4 (07.17.23), #5 (08.15.23), #6 (09.13.23), #7 (10.18.23), #8 (11.27.23), #9 (01.04.24), #10 (02.15.24), #11 (03.21.24), #12 (04.25.24), #13 (05.23.24), #14 (07.08.24),  #15 (08.15.24), #16 (09.25.24), #17 (11.06.24), #18 (11.06.24) - s/p IVE OD #1 (07.17.23), #2 (08.15.23), #3 (09.13.23), #4 (10.18.23), #5 (11.27.23), #6 (01.04.24), #7 (02.15.24), #8 (03.21.24), #9 (04.25.24), #10 (05.23.24), #11 (07.08.24), #12 (08.15.24), #13 (09.25.24), #14 (11.06.24), #15 (11.06.24) - BCVA 20/20 OU - stable - OCT shows OD: Mild interval improvement in peripapillary IRF/cystic changes, stable improvement in focal SRF superior to disc; OS: Persistent peripapillary IRF nasal macula; SRF stably resolved at 6 wks - recommend switching back to IVA OU (OD #1 and OS #5) today, 01.29.25 (due to no funding with [employer]) with follow up back to 4-6 weeks  - pt wishes to proceed with injections  - RBA of procedure discussed, questions answered - IVA informed consent obtained and signed, 01.29.25 (OU) - see procedure note   - cont Amsler grid monitoring  - Eylea approved for 2025, but there is no funding for [employer]  - f/u 4-6 weeks, DFE, OCT, possible injections, tx and ext as able  2,3. Hypertensive retinopathy OU - discussed importance of tight BP control - continue to monitor  4. Pseudophakia OU  - s/p CE/IOL (Dr. Vonna Kotyk, 2022)  - IOLs in good position, doing well - continue to monitor  Ophthalmic Meds Ordered this visit:  Meds ordered this encounter  Medications   Bevacizumab (AVASTIN) SOLN 1.25 mg   Bevacizumab (AVASTIN) SOLN 1.25 mg     Return for f/u 4-6 weeks exu ARMD OU, DFE, OCT, Possible Injxn.  There are no Patient Instructions on file for this visit.  This document serves as a record of services personally performed by Karie Chimera, MD, PhD. It was created on their behalf by Glee Arvin. Manson Passey, OA an ophthalmic technician. The creation of this record is the provider's dictation and/or activities during the visit.    Electronically signed by: Glee Arvin. Manson Passey, OA 01/11/24 12:17 PM  Karie Chimera, M.D., Ph.D. Diseases & Surgery of the Retina and  Vitreous [facility]  I have reviewed the above documentation for accuracy and completeness, and I agree with the above. Karie Chimera, M.D., Ph.D. 01/11/24 12:18 PM   Abbreviations: M myopia (nearsighted); A astigmatism; H hyperopia (farsighted); P presbyopia; Mrx spectacle prescription;  CTL contact lenses; OD right eye; OS left eye; OU both eyes  XT exotropia; ET esotropia; PEK punctate epithelial keratitis; PEE punctate epithelial erosions; DES dry eye syndrome; MGD meibomian gland dysfunction; ATs artificial tears; PFAT's preservative free artificial tears; NSC nuclear sclerotic cataract; PSC posterior subcapsular cataract; ERM epi-retinal membrane; PVD posterior vitreous detachment; RD retinal detachment; DM diabetes mellitus; DR diabetic retinopathy; NPDR non-proliferative diabetic retinopathy; PDR proliferative diabetic retinopathy; CSME clinically significant macular edema; DME diabetic macular edema; dbh dot blot hemorrhages; CWS cotton wool spot; POAG primary open angle glaucoma; C/D cup-to-disc ratio; HVF humphrey visual field; GVF goldmann visual field; OCT optical coherence tomography; IOP intraocular pressure; BRVO Branch retinal vein occlusion; CRVO central retinal vein occlusion; CRAO central retinal artery occlusion; BRAO branch retinal artery occlusion; RT retinal tear; SB scleral buckle; PPV pars plana vitrectomy; VH Vitreous hemorrhage; PRP panretinal laser photocoagulation; IVK intravitreal kenalog; VMT vitreomacular traction; MH Macular hole;  NVD neovascularization of the disc; NVE neovascularization elsewhere; AREDS age related eye disease study; ARMD age related macular degeneration; POAG primary open angle glaucoma; EBMD epithelial/anterior basement membrane dystrophy;  ACIOL anterior chamber intraocular lens; IOL intraocular lens; PCIOL posterior chamber intraocular lens; Phaco/IOL phacoemulsification with intraocular lens placement; PRK photorefractive  keratectomy; LASIK laser assisted in situ keratomileusis; HTN hypertension; DM diabetes mellitus; COPD chronic obstructive pulmonary disease

## 2024-01-11 ENCOUNTER — Ambulatory Visit (INDEPENDENT_AMBULATORY_CARE_PROVIDER_SITE_OTHER): Payer: PPO | Admitting: Ophthalmology

## 2024-01-11 ENCOUNTER — Encounter (INDEPENDENT_AMBULATORY_CARE_PROVIDER_SITE_OTHER): Payer: Self-pay | Admitting: Ophthalmology

## 2024-01-11 DIAGNOSIS — H353231 Exudative age-related macular degeneration, bilateral, with active choroidal neovascularization: Secondary | ICD-10-CM | POA: Diagnosis not present

## 2024-01-11 DIAGNOSIS — H35033 Hypertensive retinopathy, bilateral: Secondary | ICD-10-CM

## 2024-01-11 DIAGNOSIS — Z961 Presence of intraocular lens: Secondary | ICD-10-CM

## 2024-01-11 DIAGNOSIS — I10 Essential (primary) hypertension: Secondary | ICD-10-CM | POA: Diagnosis not present

## 2024-01-11 MED ORDER — BEVACIZUMAB CHEMO INJECTION 1.25MG/0.05ML SYRINGE FOR KALEIDOSCOPE
1.2500 mg | INTRAVITREAL | Status: AC | PRN
  Administered 2024-01-11: 1.25 mg via INTRAVITREAL

## 2024-01-12 ENCOUNTER — Ambulatory Visit (HOSPITAL_COMMUNITY)
Admission: RE | Admit: 2024-01-12 | Discharge: 2024-01-12 | Disposition: A | Discharge status: Home / Self Care | Payer: PPO | Source: Ambulatory Visit | Attending: Family Medicine | Admitting: Family Medicine

## 2024-01-12 DIAGNOSIS — R1319 Other dysphagia: Secondary | ICD-10-CM | POA: Insufficient documentation

## 2024-02-06 NOTE — Progress Notes (Signed)
 [facility] - Clinic Note  02/08/2024    CHIEF COMPLAINT Patient presents for Retina Follow Up  HISTORY OF PRESENT ILLNESS: Raymond Alexander is a 78 y.o. male who presents to the clinic today for:   HPI     Retina Follow Up   Patient presents with  Dry AMD.  In both eyes.  This started 4 weeks ago.  Duration of 4 weeks.  Since onset it is stable.  I, the attending physician,  performed the HPI with the patient and updated documentation appropriately.        Comments   Patient feels the vision is the same. He is using AT's PRN.       Last edited by Rennis Chris, MD on 02/08/2024  9:22 AM.    Pt states   Referring physician: Darrow Bussing, MD [address] [place],  [place] [address]  HISTORICAL INFORMATION:   Selected notes from the MEDICAL RECORD NUMBER Referred by Dr. Lorin Picket for concern of retinal edema OU   CURRENT MEDICATIONS: No current outpatient medications on file. (Ophthalmic Drugs)   No current facility-administered medications for this visit. (Ophthalmic Drugs)   Current Outpatient Medications (Other)  Medication Sig   aspirin 81 MG tablet Take 81 mg by mouth daily.   atorvastatin (LIPITOR) 80 MG tablet Take 80 mg by mouth daily.   Coenzyme Q10 (CO Q-10) 100 MG CAPS Take 300 mg by mouth daily.   ezetimibe (ZETIA) 10 MG tablet Take 10 mg by mouth daily.   fexofenadine (ALLEGRA) 180 MG tablet Take 180 mg by mouth daily as needed for allergies.    fluticasone (FLONASE) 50 MCG/ACT nasal spray Place 1 spray into both nostrils daily. PRN   IBUPROFEN & ACETAMINOPHEN PO Take by mouth as needed.   lisinopril (PRINIVIL,ZESTRIL) 10 MG tablet Take 10 mg by mouth daily.   Mag Aspart-Potassium Aspart (POTASSIUM & MAGNESIUM ASPARTAT) 250-250 MG CAPS Take 1 tablet by mouth daily.   Multiple Vitamin (MULTIVITAMIN) tablet Take 1 tablet by mouth daily.   nitroGLYCERIN (NITROSTAT) 0.4 MG SL tablet Place 1 tablet (0.4 mg total) under the  tongue every 5 (five) minutes as needed for chest pain.   Omega-3 Fatty Acids (FISH OIL) 1000 MG CAPS Take 2 capsules by mouth daily.   OVER THE COUNTER MEDICATION Take 1 tablet by mouth daily. Med Name: PROTANDIM   sildenafil (VIAGRA) 50 MG tablet Take 50 mg by mouth daily as needed for erectile dysfunction.   No current facility-administered medications for this visit. (Other)   REVIEW OF SYSTEMS: ROS   Positive for: Cardiovascular, Eyes Negative for: Constitutional, Gastrointestinal, Neurological, Skin, Genitourinary, Musculoskeletal, HENT, Endocrine, Respiratory, Psychiatric, Allergic/Imm, Heme/Lymph Last edited by Charlette Caffey, COT on 02/08/2024  8:21 AM.     ALLERGIES No Known Allergies  PAST MEDICAL HISTORY Past Medical History:  Diagnosis Date   ASCVD (arteriosclerotic cardiovascular disease)    3 vessel   Clotting disorder (HCC)    Esophageal reflux    Hyperlipidemia    Hypertension    Macular degeneration    Past Surgical History:  Procedure Laterality Date   CATARACT EXTRACTION     PTCA  10/14/2003   mid RCA   FAMILY HISTORY Family History  Problem Relation Age of Onset   Heart attack Father    Cancer Mother    Diabetes Sister    Cancer Brother    Healthy Brother    SOCIAL HISTORY Social History  Tobacco Use   Smoking status: Former   Smokeless tobacco: Never  Vaping Use   Vaping status: Never Used  Substance Use Topics   Alcohol use: Yes    Alcohol/week: 1.0 standard drink of alcohol    Types: 1 Glasses of wine per week    Comment: 1-2 per day   Drug use: No       OPHTHALMIC EXAM:  Base Eye Exam     Visual Acuity (Snellen - Linear)       Right Left   Dist cc 20/20 +2 20/20 -1    Correction: Glasses         Tonometry (Tonopen, 8:23 AM)       Right Left   Pressure 14 11         Pupils       Dark Light Shape React APD   Right 3 2 Round Brisk None   Left 3 2 Round Brisk None         Visual Fields       Left  Right    Full Full         Extraocular Movement       Right Left    Full, Ortho Full, Ortho         Neuro/Psych     Oriented x3: Yes   Mood/Affect: Normal         Dilation     Both eyes: 1.0% Mydriacyl, 2.5% Phenylephrine @ 8:21 AM           Slit Lamp and Fundus Exam     Slit Lamp Exam       Right Left   Lids/Lashes Normal small stye nasal LL margin   Conjunctiva/Sclera White and quiet White and quiet   Cornea arcus, well healed cataract wound, trace PEE arcus, well healed cataract wound, trace PEE   Anterior Chamber Deep and quiet Deep and quiet   Iris Round and dilated Round and dilated   Lens PC IOL in good position Toric PC IOL in good position with marks at 0530 and 1100   Anterior Vitreous Mild Vitreous syneresis, no cell or pigment, low lying Posterior vitreous detachment, Weiss ring Mild Vitreous syneresis, no cell or pigment, Posterior vitreous detachment         Fundus Exam       Right Left   Disc Pink and Sharp, mild peripapillary edema, ?peripapillary CNV ST to disc -- persistent, no heme Pink and Sharp, peripapillary cystic changes, CNV ST disc, no heme   C/D Ratio 0.5 0.4   Macula Good foveal reflex, nasal cystic changes/edema -- slightly improved, fine drusen vs exudate -- stably improved, no heme Good foveal reflex, mild edema nasal macula -- persistent, no heme   Vessels attenuated, Tortuous attenuated, Tortuous   Periphery Attached, No heme, no RT/RD Attached, No heme            Refraction     Wearing Rx       Sphere Cylinder Axis Add   Right [place] +0.25 060 +2.25   Left +0.25 Sphere  +2.25           IMAGING AND PROCEDURES  Imaging and Procedures for 02/08/2024  OCT, Retina - OU - Both Eyes       Right Eye Quality was good. Central Foveal Thickness: 289. Progression has been stable. Findings include normal foveal contour, no SRF, retinal drusen , intraretinal fluid, pigment epithelial detachment (Persistent  peripapillary IRF/cystic changes, stable improvement  in focal SRF superior to disc, no fluid centrally).   Left Eye Quality was good. Central Foveal Thickness: 269. Progression has worsened. Findings include normal foveal contour, no SRF, retinal drusen , intraretinal fluid (Persistent peripapillary IRF nasal macula -- slightly increased, SRF stably improved, no fluid centrally).   Notes *Images captured and stored on drive  Diagnosis / Impression:  OD: Persistent peripapillary IRF/cystic changes, stable improvement in focal SRF superior to disc, no fluid centrally OS: Persistent peripapillary IRF nasal macula -- slightly increased, SRF stably improved, no fluid centrally  Clinical management:  See below  Abbreviations: NFP - Normal foveal profile. CME - cystoid macular edema. PED - pigment epithelial detachment. IRF - intraretinal fluid. SRF - subretinal fluid. EZ - ellipsoid zone. ERM - epiretinal membrane. ORA - outer retinal atrophy. ORT - outer retinal tubulation. SRHM - subretinal hyper-reflective material. IRHM - intraretinal hyper-reflective material      Intravitreal Injection, Pharmacologic Agent - OD - Right Eye       Time Out 02/08/2024. 9:00 AM. Confirmed correct patient, procedure, site, and patient consented.   Anesthesia Topical anesthesia was used. Anesthetic medications included Lidocaine 2%, Proparacaine 0.5%.   Procedure Preparation included 5% betadine to ocular surface, eyelid speculum. A (32g) needle was used.   Injection: 1.25 mg Bevacizumab 1.25mg /0.22ml   Route: Intravitreal, Site: Right Eye   NDC: P3213405, Lot: [id number], Expiration date: 03/18/2024   Post-op Post injection exam found visual acuity of at least counting fingers. The patient tolerated the procedure well. There were no complications. The patient received written and verbal post procedure care education. Post injection medications were not given.      Intravitreal Injection,  Pharmacologic Agent - OS - Left Eye       Time Out 02/08/2024. 9:00 AM. Confirmed correct patient, procedure, site, and patient consented.   Anesthesia Topical anesthesia was used. Anesthetic medications included Lidocaine 2%, Proparacaine 0.5%.   Procedure Preparation included 5% betadine to ocular surface, eyelid speculum. A (32g) needle was used.   Injection: 1.25 mg Bevacizumab 1.25mg /0.66ml   Route: Intravitreal, Site: Left Eye   NDC: P3213405, Lot: [id number], Expiration date: 06/10/2024   Post-op Post injection exam found visual acuity of at least counting fingers. The patient tolerated the procedure well. There were no complications. The patient received written and verbal post procedure care education. Post injection medications were not given.             ASSESSMENT/PLAN:    ICD-10-CM   1. Exudative age-related macular degeneration of both eyes with active choroidal neovascularization (HCC)  H35.3231 OCT, Retina - OU - Both Eyes    Intravitreal Injection, Pharmacologic Agent - OD - Right Eye    Intravitreal Injection, Pharmacologic Agent - OS - Left Eye    Bevacizumab (AVASTIN) SOLN 1.25 mg    Bevacizumab (AVASTIN) SOLN 1.25 mg    2. Essential hypertension  I10     3. Hypertensive retinopathy of both eyes  H35.033     4. Pseudophakia, both eyes  Z96.1      1. Peripapillary CNV / exudative ARMD OU - 8.1.22 pt with 1 wk history of decreased vision OS and isolated light green flashes OD -- flashes OD resolved, blurriness OS improving  - 10.04.22 small recurrent episode of flashes OD last week.   - 11.15.22 subjectively blurred vision OS - repeat FA (08.23.22) shows mild peripapillary staining and ?leakage OU -- ?CNV  - unclear etiology, but suspect ARMD vs idiopathic CNV - s/p IVA  OS #1 (11.15.22), #2 (12.13.22), #3 (01.10.23), #4 (02.09.23), #5 (01.29.25) - s/p IVA OD #1 (01.29.25) - s/p IVE OS #1 (04.18.23), #2 (05.16.23), #3 (06.13.23), #4 (07.17.23), #5  (08.15.23), #6 (09.13.23), #7 (10.18.23), #8 (11.27.23), #9 (01.04.24), #10 (02.15.24), #11 (03.21.24), #12 (04.25.24), #13 (05.23.24), #14 (07.08.24), #15 (08.15.24), #16 (09.25.24), #17 (11.06.24), #18 (11.06.24) - s/p IVE OD #1 (07.17.23), #2 (08.15.23), #3 (09.13.23), #4 (10.18.23), #5 (11.27.23), #6 (01.04.24), #7 (02.15.24), #8 (03.21.24), #9 (04.25.24), #10 (05.23.24), #11 (07.08.24), #12 (08.15.24), #13 (09.25.24), #14 (11.06.24), #15 (11.06.24) - BCVA 20/20 OU - stable - OCT shows OD: Persistent peripapillary IRF/cystic changes, stable improvement in focal SRF superior to disc, no fluid centrally; OS: Persistent peripapillary IRF nasal macula -- slightly increased, SRF stably improved, no fluid centrally at 4 wks - recommend IVA OU (OD #2 and OS #6) today, 02.26.25 with follow up in 4-6 weeks again - pt wishes to proceed with injections  - RBA of procedure discussed, questions answered - IVA informed consent obtained and signed, 01.29.25 (OU) - see procedure note   - cont Amsler grid monitoring  - Eylea approved for 2025, but [employer] funding unavailable  - f/u 4-6 weeks, DFE, OCT, possible injections, tx and ext as able  2,3. Hypertensive retinopathy OU - discussed importance of tight BP control - continue to monitor  4. Pseudophakia OU  - s/p CE/IOL (Dr. Vonna Kotyk, 2022)  - IOLs in good position, doing well - continue to monitor  Ophthalmic Meds Ordered this visit:  Meds ordered this encounter  Medications   Bevacizumab (AVASTIN) SOLN 1.25 mg   Bevacizumab (AVASTIN) SOLN 1.25 mg     Return for f/u 4-6 weeks, exu ARMD OU, DFE, OCT.  There are no Patient Instructions on file for this visit.  This document serves as a record of services personally performed by Karie Chimera, MD, PhD. It was created on their behalf by Glee Arvin. Manson Passey, OA an ophthalmic technician. The creation of this record is the provider's dictation and/or activities during the visit.    Electronically  signed by: Glee Arvin. Manson Passey, OA 02/10/24 12:16 AM  Karie Chimera, M.D., Ph.D. Diseases & Surgery of the Retina and Vitreous [facility]  I have reviewed the above documentation for accuracy and completeness, and I agree with the above. Karie Chimera, M.D., Ph.D. 02/10/24 12:16 AM   Abbreviations: M myopia (nearsighted); A astigmatism; H hyperopia (farsighted); P presbyopia; Mrx spectacle prescription;  CTL contact lenses; OD right eye; OS left eye; OU both eyes  XT exotropia; ET esotropia; PEK punctate epithelial keratitis; PEE punctate epithelial erosions; DES dry eye syndrome; MGD meibomian gland dysfunction; ATs artificial tears; PFAT's preservative free artificial tears; NSC nuclear sclerotic cataract; PSC posterior subcapsular cataract; ERM epi-retinal membrane; PVD posterior vitreous detachment; RD retinal detachment; DM diabetes mellitus; DR diabetic retinopathy; NPDR non-proliferative diabetic retinopathy; PDR proliferative diabetic retinopathy; CSME clinically significant macular edema; DME diabetic macular edema; dbh dot blot hemorrhages; CWS cotton wool spot; POAG primary open angle glaucoma; C/D cup-to-disc ratio; HVF humphrey visual field; GVF goldmann visual field; OCT optical coherence tomography; IOP intraocular pressure; BRVO Branch retinal vein occlusion; CRVO central retinal vein occlusion; CRAO central retinal artery occlusion; BRAO branch retinal artery occlusion; RT retinal tear; SB scleral buckle; PPV pars plana vitrectomy; VH Vitreous hemorrhage; PRP panretinal laser photocoagulation; IVK intravitreal kenalog; VMT vitreomacular traction; MH Macular hole;  NVD neovascularization of the disc; NVE neovascularization elsewhere; AREDS age related eye disease study; ARMD  age related macular degeneration; POAG primary open angle glaucoma; EBMD epithelial/anterior basement membrane dystrophy; ACIOL anterior chamber intraocular lens; IOL intraocular lens; PCIOL  posterior chamber intraocular lens; Phaco/IOL phacoemulsification with intraocular lens placement; PRK photorefractive keratectomy; LASIK laser assisted in situ keratomileusis; HTN hypertension; DM diabetes mellitus; COPD chronic obstructive pulmonary disease

## 2024-02-08 ENCOUNTER — Ambulatory Visit (INDEPENDENT_AMBULATORY_CARE_PROVIDER_SITE_OTHER): Payer: PPO | Admitting: Ophthalmology

## 2024-02-08 ENCOUNTER — Encounter (INDEPENDENT_AMBULATORY_CARE_PROVIDER_SITE_OTHER): Payer: Self-pay | Admitting: Ophthalmology

## 2024-02-08 DIAGNOSIS — Z961 Presence of intraocular lens: Secondary | ICD-10-CM | POA: Diagnosis not present

## 2024-02-08 DIAGNOSIS — H539 Unspecified visual disturbance: Secondary | ICD-10-CM

## 2024-02-08 DIAGNOSIS — I10 Essential (primary) hypertension: Secondary | ICD-10-CM

## 2024-02-08 DIAGNOSIS — H353231 Exudative age-related macular degeneration, bilateral, with active choroidal neovascularization: Secondary | ICD-10-CM

## 2024-02-08 DIAGNOSIS — H35033 Hypertensive retinopathy, bilateral: Secondary | ICD-10-CM

## 2024-02-08 MED ORDER — BEVACIZUMAB CHEMO INJECTION 1.25MG/0.05ML SYRINGE FOR KALEIDOSCOPE
1.2500 mg | INTRAVITREAL | Status: AC | PRN
  Administered 2024-02-08: 1.25 mg via INTRAVITREAL

## 2024-03-08 DIAGNOSIS — M25522 Pain in left elbow: Secondary | ICD-10-CM | POA: Diagnosis not present

## 2024-03-08 DIAGNOSIS — M25511 Pain in right shoulder: Secondary | ICD-10-CM | POA: Diagnosis not present

## 2024-03-13 DIAGNOSIS — M25522 Pain in left elbow: Secondary | ICD-10-CM | POA: Diagnosis not present

## 2024-03-13 DIAGNOSIS — M25511 Pain in right shoulder: Secondary | ICD-10-CM | POA: Diagnosis not present

## 2024-03-13 NOTE — Progress Notes (Signed)
 [facility] [address] [place] [address] Phone: [phone] Subjective:   Raymond Alexander, am serving as a scribe for Dr. Antoine Primas.  I'm seeing this patient by the request  of:  Koirala, Dibas, MD  CC: Bilateral hip pain and shoulder pain.  [id number]  12/20/2023 Symptomatic on the right side.  Discussed icing regimen and home exercises, discussed which activities to do and which ones to avoid.  Increase activity slowly.  Follow-up again in 6 to 8 weeks with worsening symptoms noted then will consider surgery      Injection given again.  Chronic problem with exacerbation.  Hopefully patient with significant improvement.  Discussed icing regimen.  Worsening symptoms would need to consider surgical intervention but I do not think it will be necessary.  Follow-up again in 2 to 3 months     Updated 03/20/2024 Raymond Alexander is a 78 y.o. male coming in with complaint of shoulder and B hip pain. Patient states that he has been doing HEP. Did 2 sessions of dry needling. Shoulder has been doing well. No pain in past 2 sessions. Had some pain while swimming and that prompted him to get dry needling. No pain today with swimming.   L elbow is not bothering him at this time.   R groin pain. Will Dr. Despina Hick next week.   Lumbar spine MRI tomorrow. Will likely get epidural injections next week after MRI.       Past Medical History:  Diagnosis Date   ASCVD (arteriosclerotic cardiovascular disease)    3 vessel   Clotting disorder (HCC)    Esophageal reflux    Hyperlipidemia    Hypertension    Macular degeneration    Past Surgical History:  Procedure Laterality Date   CATARACT EXTRACTION     PTCA  10/14/2003   mid RCA   Social History   Socioeconomic History   Marital status: Married    Spouse name: Not on file   Number of children: Not on file   Years of education: Not on file   Highest education level: Not on file  Occupational  History   Not on file  Tobacco Use   Smoking status: Former   Smokeless tobacco: Never  Vaping Use   Vaping status: Never Used  Substance and Sexual Activity   Alcohol use: Yes    Alcohol/week: 1.0 standard drink of alcohol    Types: 1 Glasses of wine per week    Comment: 1-2 per day   Drug use: No   Sexual activity: Yes  Other Topics Concern   Not on file  Social History Narrative   Not on file   Social Drivers of Health   Financial Resource Strain: Not on file  Food Insecurity: Not on file  Transportation Needs: Not on file  Physical Activity: Not on file  Stress: Not on file  Social Connections: Not on file   No Known Allergies Family History  Problem Relation Age of Onset   Heart attack Father    Cancer Mother    Diabetes Sister    Cancer Brother    Healthy Brother      Current Outpatient Medications (Cardiovascular):    atorvastatin (LIPITOR) 80 MG tablet, Take 80 mg by mouth daily.   ezetimibe (ZETIA) 10 MG tablet, Take 10 mg by mouth daily.   lisinopril (PRINIVIL,ZESTRIL) 10 MG tablet, Take 10 mg by mouth daily.   nitroGLYCERIN (NITROSTAT) 0.4 MG SL tablet, Place  1 tablet (0.4 mg total) under the tongue every 5 (five) minutes as needed for chest pain.   sildenafil (VIAGRA) 50 MG tablet, Take 50 mg by mouth daily as needed for erectile dysfunction.  Current Outpatient Medications (Respiratory):    fexofenadine (ALLEGRA) 180 MG tablet, Take 180 mg by mouth daily as needed for allergies.    fluticasone (FLONASE) 50 MCG/ACT nasal spray, Place 1 spray into both nostrils daily. PRN  Current Outpatient Medications (Analgesics):    aspirin 81 MG tablet, Take 81 mg by mouth daily.   IBUPROFEN & ACETAMINOPHEN PO, Take by mouth as needed.   Current Outpatient Medications (Other):    Coenzyme Q10 (CO Q-10) 100 MG CAPS, Take 300 mg by mouth daily.   Mag Aspart-Potassium Aspart (POTASSIUM & MAGNESIUM ASPARTAT) 250-250 MG CAPS, Take 1 tablet by mouth daily.    Multiple Vitamin (MULTIVITAMIN) tablet, Take 1 tablet by mouth daily.   Omega-3 Fatty Acids (FISH OIL) 1000 MG CAPS, Take 2 capsules by mouth daily.   OVER THE COUNTER MEDICATION, Take 1 tablet by mouth daily. Med Name: PROTANDIM   Reviewed prior external information including notes and imaging from  primary care provider As well as notes that were available from care everywhere and other healthcare systems.  Past medical history, social, surgical and family history all reviewed in electronic medical record.  No pertanent information unless stated regarding to the chief complaint.   Review of Systems:  No headache, visual changes, nausea, vomiting, diarrhea, constipation, dizziness, abdominal pain, skin rash, fevers, chills, night sweats, weight loss, swollen lymph nodes, body aches, joint swelling, chest pain, shortness of breath, mood changes. POSITIVE muscle aches  Objective  Blood pressure (!) 120/50, pulse (!) 55, height 5\' 10"  (1.778 m), weight 186 lb (84.4 kg), SpO2 98%.   General: No apparent distress alert and oriented x3 mood and affect normal, dressed appropriately.  HEENT: Pupils equal, extraocular movements intact  Respiratory: Patient's speak in full sentences and does not appear short of breath  Cardiovascular: No lower extremity edema, non tender, no erythema  Right shoulder exam does have good range of motion noted.  Still has a positive impingement noted with crossover test.  Limited muscular skeletal ultrasound was performed and interpreted by Antoine Primas, M  Limited ultrasound shows that there is some hypoechoic changes noted.  Seems to be in the subacromial area as well as the acromioclavicular joint but significantly less than previous exam. Impression: Interval improvement   Impression and Recommendations:     The above documentation has been reviewed and is accurate and complete Judi Saa, DO

## 2024-03-15 ENCOUNTER — Ambulatory Visit (INDEPENDENT_AMBULATORY_CARE_PROVIDER_SITE_OTHER): Admitting: Physical Medicine and Rehabilitation

## 2024-03-15 ENCOUNTER — Encounter: Payer: Self-pay | Admitting: Physical Medicine and Rehabilitation

## 2024-03-15 DIAGNOSIS — M47816 Spondylosis without myelopathy or radiculopathy, lumbar region: Secondary | ICD-10-CM

## 2024-03-15 DIAGNOSIS — M545 Low back pain, unspecified: Secondary | ICD-10-CM

## 2024-03-15 DIAGNOSIS — G8929 Other chronic pain: Secondary | ICD-10-CM | POA: Diagnosis not present

## 2024-03-15 NOTE — Progress Notes (Signed)
 Pain Scale   Average Pain 2        +Driver, -BT, -Dye Allergies.

## 2024-03-15 NOTE — Progress Notes (Signed)
 Raymond Alexander - 78 y.o. male MRN [id number]  Date of birth: January 13, 1946  Office Visit Note: Visit Date: 03/15/2024 PCP: Darrow Bussing, MD Referred by: Darrow Bussing, MD  Subjective: Chief Complaint  Patient presents with   Lower Back - Pain   HPI: Raymond Alexander is a 78 y.o. male who comes in today per the request of Dr. Darrow Bussing for evaluation of chronic, worsening and severe bilateral lower back pain, intermittent radiation of pain to right anterior lower leg. Pain ongoing for several years, worsens with prolonged sitting. Also reports pain when moving from sitting to standing position. He describes pain as sore and stiff sensation, currently rates as 2 out of 10. Some relief of pain with home exercise regimen, heating pad, rest and use of medications. He is currently working with Lorenda Peck, PT and reports significant relief of pain with PT therapies/dry needling. Lumbar MRI imaging from 2018 advanced bilateral facet arthropathy at L4-L5 allowing 3 mm of anterolisthesis. 8 mm synovial cyst projecting inward from the facet on the right. There is bilateral lateral recess narrowing at this level, worse on the right due to cyst that could cause L5 nerve root compression. He did undergo multiple epidural steroid injections with Dr. Callie Fielding several years ago, good relief of pain with these injections.  He reports issues with bilateral hip pain and degenerative changes to both hips, he is scheduled for upcoming appointment with Dr. Lequita Halt with [facility] on 03/29/2024. patient denies focal weakness, numbness and tingling. No recent trauma or falls.   He is a very active person, swims and hikes regularly. He is also avid ping pong player. Patient is preparing for trip to [place] on 03/30/2024.     Review of Systems  Musculoskeletal:  Positive for back pain.  Neurological:  Negative for tingling, sensory change, focal weakness and weakness.  All other systems reviewed and are  negative.  Otherwise per HPI.  Assessment & Plan: Visit Diagnoses:    ICD-10-CM   1. Chronic bilateral low back pain without sciatica  M54.50 Ambulatory referral to Physical Medicine Rehab   G89.29 MR LUMBAR SPINE WO CONTRAST    2. Spondylosis without myelopathy or radiculopathy, lumbar region  M47.816 Ambulatory referral to Physical Medicine Rehab    MR LUMBAR SPINE WO CONTRAST    3. Facet arthropathy, lumbar  M47.816 Ambulatory referral to Physical Medicine Rehab    MR LUMBAR SPINE WO CONTRAST       Plan: Findings:  Chronic, worsening and severe bilateral lower back pain, intermittent radiation of pain to right anterior lower leg.  Patient continues to have pain despite good conservative therapy such as formal physical therapy/dry needling, home exercise regimen, rest and use of medications.  Patient's clinical presentation and exam are consistent with facet mediated pain.  There is significant facet arthritis at the level of L4-L5 on prior lumbar MRI imaging from 2018.  He does have pain with lumbar extension on exam today.  I do feel the intermittent radiation of pain to his right lower leg is more of a facet joint syndrome.  We discussed treatment plan in detail today.  Next step is to perform diagnostic and hopefully therapeutic bilateral L4-L5 facet joint injections under fluoroscopic guidance.  If good relief of pain with injection we can repeat this procedure infrequently as needed.  I also placed order for new lumbar MRI imaging. He will be leaving for 3 week trip to [place] at the end of April, I think we  can get him in for facet joint injections prior to his trip. I will review lumbar MRI with him after imaging is completed. Dr. Alvester Morin at bedside to answer questions/concerns. No red flag symptoms noted upon exam today.     Meds & Orders: No orders of the defined types were placed in this encounter.   Orders Placed This Encounter  Procedures   MR LUMBAR SPINE WO CONTRAST    Ambulatory referral to Physical Medicine Rehab    Follow-up: Return for Bilateral L4-L5 facet joint injections.   Procedures: No procedures performed      Clinical History: Narrative & Impression CLINICAL DATA:  Right buttock pain radiating down the right leg since November of last year.   EXAM: MRI LUMBAR SPINE WITHOUT CONTRAST   TECHNIQUE: Multiplanar, multisequence MR imaging of the lumbar spine was performed. No intravenous contrast was administered.   COMPARISON:  None.   FINDINGS: Segmentation:  5 lumbar type vertebral bodies.   Alignment:  3 mm anterolisthesis L4-5.   Vertebrae: Benign appearing hemangioma within the T12 vertebral body. Smaller hemangioma within the L2 vertebral body. Superior endplate Schmorl's node at L3 and L4 without active edema.   Conus medullaris: Extends to the T12-L1 level and appears normal.   Paraspinal and other soft tissues: Negative   Disc levels:   T12-L1: Normal appearance of the disc. Hypertrophic facet degeneration right more than left encroaches upon the canal mildly but does not appear to cause neural compression. This level was not studied in the axial plane.   L1-2: Normal.   L2-3:  Normal.   L3-4: Shallow broad-based disc herniation slightly more prominent towards the right. Indentation of the thecal sac without evidence of neural compression. Mild facet osteoarthritis.   L4-5: Advanced bilateral facet arthropathy which allows 3 mm of anterolisthesis. Mild bulging of the disc. 8 mm synovial cyst projecting inward from the facet on the right. Narrowing of the lateral recesses, worse on the right because of the synovial cyst. This appearance could worsen with standing or flexion.   L5-S1: Bulging of the disc.  No stenosis.   IMPRESSION: Advanced bilateral facet arthropathy at L4-5 allowing 3 mm of anterolisthesis. 8 mm synovial cyst projecting inward from the facet on the right. Bulging of the disc. Stenosis of  the lateral recesses right more than left, worse on the right because of the synovial cyst. Right L5 nerve root compression is probably occurring because of this. This appearance could worsen with standing or flexion.   L3-4: Shallow broad-based disc herniation more prominent towards the right indents the thecal sac but does not appear to cause neural compression. Facet osteoarthritis at this level without slippage.     Electronically Signed   By: Paulina Fusi M.D.   On: 12/24/2016 15:21   He reports that he has quit smoking. He has never used smokeless tobacco. No results for input(s): "HGBA1C", "LABURIC" in the last 8760 hours.  Objective:  VS:  HT:    WT:   BMI:     BP:   HR: bpm  TEMP: ( )  RESP:  Physical Exam Vitals and nursing note reviewed.  HENT:     Head: Normocephalic and atraumatic.     Right Ear: External ear normal.     Left Ear: External ear normal.     Nose: Nose normal.     Mouth/Throat:     Mouth: Mucous membranes are moist.  Eyes:     Extraocular Movements: Extraocular movements intact.  Cardiovascular:  Rate and Rhythm: Normal rate.     Pulses: Normal pulses.  Pulmonary:     Effort: Pulmonary effort is normal.  Abdominal:     General: Abdomen is flat. There is no distension.  Musculoskeletal:        General: Tenderness present.     Cervical back: Normal range of motion.     Comments: Patient rises from seated position to standing without difficulty. Pain noted with facet loading and lumbar extension. 5/5 strength noted with bilateral hip flexion, knee flexion/extension, ankle dorsiflexion/plantarflexion and EHL. No clonus noted bilaterally. No pain upon palpation of greater trochanters. No pain with internal/external rotation of bilateral hips. Sensation intact bilaterally. Negative slump test bilaterally. Ambulates without aid, gait steady.     Skin:    General: Skin is warm and dry.     Capillary Refill: Capillary refill takes less than 2  seconds.  Neurological:     General: No focal deficit present.     Mental Status: He is alert and oriented to person, place, and time.  Psychiatric:        Mood and Affect: Mood normal.        Behavior: Behavior normal.     Ortho Exam  Imaging: No results found.  Past Medical/Family/Surgical/Social History: Medications & Allergies reviewed per EMR, new medications updated. Patient Active Problem List   Diagnosis Date Noted   Bilateral hip joint arthritis 12/20/2023   Right hip pain 10/04/2023   Left elbow pain 08/02/2023   AC (acromioclavicular) arthritis 05/18/2023   Coronary atherosclerosis of native coronary artery 06/13/2014   Essential hypertension, benign 06/13/2014   Hyperlipidemia 06/13/2014   Past Medical History:  Diagnosis Date   ASCVD (arteriosclerotic cardiovascular disease)    3 vessel   Clotting disorder (HCC)    Esophageal reflux    Hyperlipidemia    Hypertension    Macular degeneration    Family History  Problem Relation Age of Onset   Heart attack Father    Cancer Mother    Diabetes Sister    Cancer Brother    Healthy Brother    Past Surgical History:  Procedure Laterality Date   CATARACT EXTRACTION     PTCA  10/14/2003   mid RCA   Social History   Occupational History   Not on file  Tobacco Use   Smoking status: Former   Smokeless tobacco: Never  Vaping Use   Vaping status: Never Used  Substance and Sexual Activity   Alcohol use: Yes    Alcohol/week: 1.0 standard drink of alcohol    Types: 1 Glasses of wine per week    Comment: 1-2 per day   Drug use: No   Sexual activity: Yes

## 2024-03-15 NOTE — Progress Notes (Signed)
 Core Outcome Measures Index (COMI) Back Score  Average Pain 2  COMI Score 20%

## 2024-03-19 NOTE — Progress Notes (Signed)
 [facility] - Clinic Note  03/21/2024    CHIEF COMPLAINT Patient presents for Retina Follow Up  HISTORY OF PRESENT ILLNESS: Raymond Alexander is a 78 y.o. male who presents to the clinic today for:   HPI     Retina Follow Up   Patient presents with  Wet AMD.  Severity is mild.  Duration of 6 weeks.  I, the attending physician,  performed the HPI with the patient and updated documentation appropriately.        Comments   6 week Retina eval. Patient states no vision changes noticed. Vision checked with J card      Last edited by Rennis Chris, MD on 03/21/2024 11:06 PM.     Pt states   Referring physician: Darrow Bussing, MD [address] [place],  [place] [address]  HISTORICAL INFORMATION:   Selected notes from the MEDICAL RECORD NUMBER Referred by Dr. Lorin Picket for concern of retinal edema OU   CURRENT MEDICATIONS: No current outpatient medications on file. (Ophthalmic Drugs)   No current facility-administered medications for this visit. (Ophthalmic Drugs)   Current Outpatient Medications (Other)  Medication Sig   aspirin 81 MG tablet Take 81 mg by mouth daily.   atorvastatin (LIPITOR) 80 MG tablet Take 80 mg by mouth daily.   Coenzyme Q10 (CO Q-10) 100 MG CAPS Take 300 mg by mouth daily.   ezetimibe (ZETIA) 10 MG tablet Take 10 mg by mouth daily.   fexofenadine (ALLEGRA) 180 MG tablet Take 180 mg by mouth daily as needed for allergies.    fluticasone (FLONASE) 50 MCG/ACT nasal spray Place 1 spray into both nostrils daily. PRN   IBUPROFEN & ACETAMINOPHEN PO Take by mouth as needed.   lisinopril (PRINIVIL,ZESTRIL) 10 MG tablet Take 10 mg by mouth daily.   Mag Aspart-Potassium Aspart (POTASSIUM & MAGNESIUM ASPARTAT) 250-250 MG CAPS Take 1 tablet by mouth daily.   Multiple Vitamin (MULTIVITAMIN) tablet Take 1 tablet by mouth daily.   nitroGLYCERIN (NITROSTAT) 0.4 MG SL tablet Place 1 tablet (0.4 mg total) under the tongue every 5 (five)  minutes as needed for chest pain.   Omega-3 Fatty Acids (FISH OIL) 1000 MG CAPS Take 2 capsules by mouth daily.   OVER THE COUNTER MEDICATION Take 1 tablet by mouth daily. Med Name: PROTANDIM   sildenafil (VIAGRA) 50 MG tablet Take 50 mg by mouth daily as needed for erectile dysfunction.   No current facility-administered medications for this visit. (Other)   REVIEW OF SYSTEMS: ROS   Positive for: Cardiovascular, Eyes Negative for: Constitutional, Gastrointestinal, Neurological, Skin, Genitourinary, Musculoskeletal, HENT, Endocrine, Respiratory, Psychiatric, Allergic/Imm, Heme/Lymph Last edited by Lana Fish, COT on 03/21/2024  1:42 PM.     ALLERGIES No Known Allergies  PAST MEDICAL HISTORY Past Medical History:  Diagnosis Date   ASCVD (arteriosclerotic cardiovascular disease)    3 vessel   Clotting disorder (HCC)    Esophageal reflux    Hyperlipidemia    Hypertension    Macular degeneration    Past Surgical History:  Procedure Laterality Date   CATARACT EXTRACTION     PTCA  10/14/2003   mid RCA   FAMILY HISTORY Family History  Problem Relation Age of Onset   Heart attack Father    Cancer Mother    Diabetes Sister    Cancer Brother    Healthy Brother    SOCIAL HISTORY Social History   Tobacco Use   Smoking status: Former   Smokeless  tobacco: Never  Vaping Use   Vaping status: Never Used  Substance Use Topics   Alcohol use: Yes    Alcohol/week: 1.0 standard drink of alcohol    Types: 1 Glasses of wine per week    Comment: 1-2 per day   Drug use: No       OPHTHALMIC EXAM:  Base Eye Exam     Visual Acuity (Snellen - Linear)       Right Left   Dist cc 20/25 +1 20/20   Dist ph cc 20/NI     Correction: Glasses         Tonometry (Tonopen, 1:47 PM)       Right Left   Pressure 11 12         Pupils       Dark Light Shape React APD   Right 3 2 Round Brisk None   Left 3 2 Round Brisk None         Visual Fields (Counting fingers)        Left Right    Full Full         Extraocular Movement       Right Left    Full, Ortho Full, Ortho         Neuro/Psych     Oriented x3: Yes   Mood/Affect: Normal         Dilation     Both eyes: 1.0% Mydriacyl, 2.5% Phenylephrine @ 1:48 PM           Slit Lamp and Fundus Exam     Slit Lamp Exam       Right Left   Lids/Lashes Normal small stye nasal LL margin   Conjunctiva/Sclera White and quiet White and quiet   Cornea arcus, well healed cataract wound, trace PEE arcus, well healed cataract wound, trace PEE   Anterior Chamber Deep and quiet Deep and quiet   Iris Round and dilated Round and dilated   Lens PC IOL in good position Toric PC IOL in good position with marks at 0530 and 1100   Anterior Vitreous Mild Vitreous syneresis, no cell or pigment, low lying Posterior vitreous detachment, Weiss ring Mild Vitreous syneresis, no cell or pigment, Posterior vitreous detachment         Fundus Exam       Right Left   Disc Pink and Sharp, mild peripapillary edema -- increased, ?peripapillary CNV ST to disc -- increased, no heme Pink and Sharp, peripapillary cystic changes -- increased, CNV ST disc, no heme   C/D Ratio 0.5 0.4   Macula Good foveal reflex, nasal cystic changes/edema -- increased fine drusen vs exudate -- stably improved, no heme Good foveal reflex, mild edema nasal macula -- slightly increased, no heme   Vessels attenuated, Tortuous attenuated, Tortuous   Periphery Attached, No heme, no RT/RD Attached, No heme            Refraction     Wearing Rx       Sphere Cylinder Axis Add   Right [place] +0.25 060 +2.25   Left +0.25 Sphere  +2.25           IMAGING AND PROCEDURES  Imaging and Procedures for 03/21/2024  OCT, Retina - OU - Both Eyes       Right Eye Quality was good. Central Foveal Thickness: 291. Progression has worsened. Findings include normal foveal contour, no SRF, retinal drusen , intraretinal fluid, pigment epithelial  detachment (Interval increase in peripapillary IRF/cystic  changes, stable improvement in focal SRF superior to disc, no fluid centrally).   Left Eye Quality was good. Central Foveal Thickness: 272. Progression has worsened. Findings include normal foveal contour, no SRF, retinal drusen , intraretinal fluid (Persistent peripapillary IRF nasal macula -- slightly increased, SRF stably improved, no fluid centrally).   Notes *Images captured and stored on drive  Diagnosis / Impression:  OD: interval increase in peripapillary IRF/cystic changes, stable improvement in focal SRF superior to disc, no fluid centrally OS: Persistent peripapillary IRF nasal macula -- slightly increased, SRF stably improved, no fluid centrally  Clinical management:  See below  Abbreviations: NFP - Normal foveal profile. CME - cystoid macular edema. PED - pigment epithelial detachment. IRF - intraretinal fluid. SRF - subretinal fluid. EZ - ellipsoid zone. ERM - epiretinal membrane. ORA - outer retinal atrophy. ORT - outer retinal tubulation. SRHM - subretinal hyper-reflective material. IRHM - intraretinal hyper-reflective material      Intravitreal Injection, Pharmacologic Agent - OS - Left Eye       Time Out 03/21/2024. 2:24 PM. Confirmed correct patient, procedure, site, and patient consented.   Anesthesia Topical anesthesia was used. Anesthetic medications included Lidocaine 2%, Proparacaine 0.5%.   Procedure Preparation included 5% betadine to ocular surface, eyelid speculum. A supplied (32g) needle was used.   Injection: 1.25 mg Bevacizumab 1.25mg /0.79ml   Route: Intravitreal, Site: Left Eye   NDC: 16109-604-54, Lot: [id number] , Expiration date: 04/21/2024   Post-op Post injection exam found visual acuity of at least counting fingers. The patient tolerated the procedure well. There were no complications. The patient received written and verbal post procedure care education. Post injection medications were  not given.      Intravitreal Injection, Pharmacologic Agent - OD - Right Eye       Time Out 03/21/2024. 2:25 PM. Confirmed correct patient, procedure, site, and patient consented.   Anesthesia Topical anesthesia was used. Anesthetic medications included Lidocaine 2%, Proparacaine 0.5%.   Procedure Preparation included 5% betadine to ocular surface, eyelid speculum. A (32g) needle was used.   Injection: 6 mg faricimab-svoa 6 MG/0.05ML (Patient supplied)   Route: Intravitreal, Site: Right Eye   NDC: 62376-283-15, Lot: [id number], Expiration date: 08/11/2025, Waste: 0 mL   Post-op Post injection exam found visual acuity of at least counting fingers. The patient tolerated the procedure well. There were no complications. The patient received written and verbal post procedure care education. Post injection medications were not given.   Notes **SAMPLE MEDICATION ADMINISTERED**           ASSESSMENT/PLAN:    ICD-10-CM   1. Exudative age-related macular degeneration of both eyes with active choroidal neovascularization (HCC)  H35.3231 OCT, Retina - OU - Both Eyes    Intravitreal Injection, Pharmacologic Agent - OS - Left Eye    Intravitreal Injection, Pharmacologic Agent - OD - Right Eye    Bevacizumab (AVASTIN) SOLN 1.25 mg    faricimab-svoa (VABYSMO) 6mg /0.16mL intravitreal injection    2. Essential hypertension  I10     3. Hypertensive retinopathy of both eyes  H35.033     4. Pseudophakia, both eyes  Z96.1     5. Visual disturbances  H53.9      1. Peripapillary CNV / exudative ARMD OU - 8.1.22 pt with 1 wk history of decreased vision OS and isolated light green flashes OD -- flashes OD resolved, blurriness OS improving  - 10.04.22 small recurrent episode of flashes OD last week.   - 11.15.22 subjectively blurred vision OS -  repeat FA (08.23.22) shows mild peripapillary staining and ?leakage OU -- ?CNV  - unclear etiology, but suspect ARMD vs idiopathic CNV - s/p IVA OS #1  (11.15.22), #2 (12.13.22), #3 (01.10.23), #4 (02.09.23), #5 (01.29.25), #6 (02.26.25) - s/p IVA OD #1 (01.29.25), #2 (02.26.25) - s/p IVE OS #1 (04.18.23), #2 (05.16.23), #3 (06.13.23), #4 (07.17.23), #5 (08.15.23), #6 (09.13.23), #7 (10.18.23), #8 (11.27.23), #9 (01.04.24), #10 (02.15.24), #11 (03.21.24), #12 (04.25.24), #13 (05.23.24), #14 (07.08.24), #15 (08.15.24), #16 (09.25.24), #17 (11.06.24), #18 (11.06.24) - s/p IVE OD #1 (07.17.23), #2 (08.15.23), #3 (09.13.23), #4 (10.18.23), #5 (11.27.23), #6 (01.04.24), #7 (02.15.24), #8 (03.21.24), #9 (04.25.24), #10 (05.23.24), #11 (07.08.24), #12 (08.15.24), #13 (09.25.24), #14 (11.06.24), #15 (11.06.24) - BCVA OD 20/25 from 20/20; OS 20/20 - stable - OCT shows OD: interval increase in peripapillary IRF/cystic changes, stable improvement in focal SRF superior to disc, no fluid centrally; OS: Persistent peripapillary IRF nasal macula -- slightly increased, SRF stably improved, no fluid centrally at 6 wks - recommend IVA OS #7 and IVV OD #1 (sample) today, 04.07.25 with follow up back to 5 weeks - pt wishes to proceed with injections  - RBA of procedure discussed, questions answered - IVA informed consent obtained and signed, 01.29.25 (OU) - IVV informed consent obtained and signed, 04.09.25 (OD) - see procedure note   - cont Amsler grid monitoring  - Eylea approved for 2025, but [employer] funding unavailable  - f/u 5 weeks, DFE, OCT, possible injections, tx and ext as able  2,3. Hypertensive retinopathy OU - discussed importance of tight BP control - continue to monitor  4. Pseudophakia OU  - s/p CE/IOL (Dr. Vonna Kotyk, 2022)  - IOLs in good position, doing well - continue to monitor  Ophthalmic Meds Ordered this visit:  Meds ordered this encounter  Medications   Bevacizumab (AVASTIN) SOLN 1.25 mg   faricimab-svoa (VABYSMO) 6mg /0.23mL intravitreal injection     Return in about 5 weeks (around 04/25/2024) for f/u exu ARMD OU, DFE, OCT,  Possible Injxn.  There are no Patient Instructions on file for this visit.  This document serves as a record of services personally performed by Karie Chimera, MD, PhD. It was created on their behalf by Glee Arvin. Manson Passey, OA an ophthalmic technician. The creation of this record is the provider's dictation and/or activities during the visit.    Electronically signed by: Glee Arvin. Manson Passey, OA 03/21/24 11:09 PM   Karie Chimera, M.D., Ph.D. Diseases & Surgery of the Retina and Vitreous [facility]  I have reviewed the above documentation for accuracy and completeness, and I agree with the above. Karie Chimera, M.D., Ph.D. 03/21/24 11:11 PM   Abbreviations: M myopia (nearsighted); A astigmatism; H hyperopia (farsighted); P presbyopia; Mrx spectacle prescription;  CTL contact lenses; OD right eye; OS left eye; OU both eyes  XT exotropia; ET esotropia; PEK punctate epithelial keratitis; PEE punctate epithelial erosions; DES dry eye syndrome; MGD meibomian gland dysfunction; ATs artificial tears; PFAT's preservative free artificial tears; NSC nuclear sclerotic cataract; PSC posterior subcapsular cataract; ERM epi-retinal membrane; PVD posterior vitreous detachment; RD retinal detachment; DM diabetes mellitus; DR diabetic retinopathy; NPDR non-proliferative diabetic retinopathy; PDR proliferative diabetic retinopathy; CSME clinically significant macular edema; DME diabetic macular edema; dbh dot blot hemorrhages; CWS cotton wool spot; POAG primary open angle glaucoma; C/D cup-to-disc ratio; HVF humphrey visual field; GVF goldmann visual field; OCT optical coherence tomography; IOP intraocular pressure; BRVO Branch retinal vein occlusion; CRVO central retinal vein occlusion;  CRAO central retinal artery occlusion; BRAO branch retinal artery occlusion; RT retinal tear; SB scleral buckle; PPV pars plana vitrectomy; VH Vitreous hemorrhage; PRP panretinal laser photocoagulation; IVK  intravitreal kenalog; VMT vitreomacular traction; MH Macular hole;  NVD neovascularization of the disc; NVE neovascularization elsewhere; AREDS age related eye disease study; ARMD age related macular degeneration; POAG primary open angle glaucoma; EBMD epithelial/anterior basement membrane dystrophy; ACIOL anterior chamber intraocular lens; IOL intraocular lens; PCIOL posterior chamber intraocular lens; Phaco/IOL phacoemulsification with intraocular lens placement; PRK photorefractive keratectomy; LASIK laser assisted in situ keratomileusis; HTN hypertension; DM diabetes mellitus; COPD chronic obstructive pulmonary disease

## 2024-03-20 ENCOUNTER — Other Ambulatory Visit: Payer: Self-pay

## 2024-03-20 ENCOUNTER — Ambulatory Visit: Payer: PPO | Admitting: Family Medicine

## 2024-03-20 ENCOUNTER — Encounter: Payer: Self-pay | Admitting: Family Medicine

## 2024-03-20 VITALS — BP 120/50 | HR 55 | Ht 70.0 in | Wt 186.0 lb

## 2024-03-20 DIAGNOSIS — M160 Bilateral primary osteoarthritis of hip: Secondary | ICD-10-CM | POA: Diagnosis not present

## 2024-03-20 DIAGNOSIS — M25511 Pain in right shoulder: Secondary | ICD-10-CM | POA: Diagnosis not present

## 2024-03-20 DIAGNOSIS — M19011 Primary osteoarthritis, right shoulder: Secondary | ICD-10-CM | POA: Diagnosis not present

## 2024-03-20 DIAGNOSIS — M25522 Pain in left elbow: Secondary | ICD-10-CM

## 2024-03-20 NOTE — Assessment & Plan Note (Signed)
 Seeing orthopedic surgery in the near future.  Does have underlying also back discomfort and is seeing another provider for potential epidural in the near future.

## 2024-03-20 NOTE — Assessment & Plan Note (Signed)
 Stable at the moment no changes

## 2024-03-20 NOTE — Patient Instructions (Signed)
 Great to see you Have great vacation Have appt in 3 months

## 2024-03-20 NOTE — Assessment & Plan Note (Signed)
 Significant decrease in the hypoechoic changes that was noted previously.  Will have her follow-up again in 3 months.  Discussed limiting certain range of motion.

## 2024-03-21 ENCOUNTER — Ambulatory Visit
Admission: RE | Admit: 2024-03-21 | Discharge: 2024-03-21 | Disposition: A | Discharge status: Home / Self Care | Source: Ambulatory Visit | Attending: Physical Medicine and Rehabilitation | Admitting: Physical Medicine and Rehabilitation

## 2024-03-21 ENCOUNTER — Ambulatory Visit (INDEPENDENT_AMBULATORY_CARE_PROVIDER_SITE_OTHER): Payer: PPO | Admitting: Ophthalmology

## 2024-03-21 ENCOUNTER — Encounter (INDEPENDENT_AMBULATORY_CARE_PROVIDER_SITE_OTHER): Payer: Self-pay | Admitting: Ophthalmology

## 2024-03-21 DIAGNOSIS — H539 Unspecified visual disturbance: Secondary | ICD-10-CM

## 2024-03-21 DIAGNOSIS — H353231 Exudative age-related macular degeneration, bilateral, with active choroidal neovascularization: Secondary | ICD-10-CM

## 2024-03-21 DIAGNOSIS — H35033 Hypertensive retinopathy, bilateral: Secondary | ICD-10-CM

## 2024-03-21 DIAGNOSIS — M25511 Pain in right shoulder: Secondary | ICD-10-CM | POA: Diagnosis not present

## 2024-03-21 DIAGNOSIS — M47816 Spondylosis without myelopathy or radiculopathy, lumbar region: Secondary | ICD-10-CM | POA: Diagnosis not present

## 2024-03-21 DIAGNOSIS — Z961 Presence of intraocular lens: Secondary | ICD-10-CM

## 2024-03-21 DIAGNOSIS — M5146 Schmorl's nodes, lumbar region: Secondary | ICD-10-CM | POA: Diagnosis not present

## 2024-03-21 DIAGNOSIS — I10 Essential (primary) hypertension: Secondary | ICD-10-CM | POA: Diagnosis not present

## 2024-03-21 DIAGNOSIS — M25522 Pain in left elbow: Secondary | ICD-10-CM | POA: Diagnosis not present

## 2024-03-21 DIAGNOSIS — M545 Low back pain, unspecified: Secondary | ICD-10-CM

## 2024-03-21 DIAGNOSIS — M48061 Spinal stenosis, lumbar region without neurogenic claudication: Secondary | ICD-10-CM | POA: Diagnosis not present

## 2024-03-21 DIAGNOSIS — M5126 Other intervertebral disc displacement, lumbar region: Secondary | ICD-10-CM | POA: Diagnosis not present

## 2024-03-21 MED ORDER — BEVACIZUMAB CHEMO INJECTION 1.25MG/0.05ML SYRINGE FOR KALEIDOSCOPE
1.2500 mg | INTRAVITREAL | Status: AC | PRN
Start: 2024-03-21 — End: 2024-03-21
  Administered 2024-03-21: 1.25 mg via INTRAVITREAL

## 2024-03-21 MED ORDER — FARICIMAB-SVOA 6 MG/0.05ML IZ SOLN
6.0000 mg | INTRAVITREAL | Status: AC | PRN
  Administered 2024-03-21: 6 mg via INTRAVITREAL

## 2024-03-21 NOTE — Progress Notes (Deleted)
  Cardiology Office Note:  .   Date:  03/21/2024  ID:  Raymond Alexander, DOB 05/10/1946, MRN [id number] PCP: Darrow Bussing, MD  [facility] Providers Cardiologist:  Lance Muss, MD { Click to update primary MD,subspecialty MD or APP then REFRESH:1}   History of Present Illness: .   No chief complaint on file.   Raymond Alexander is a 78 y.o. male with history of CAD who presents for follow-up.      Problem List CAD -mRCA DES 2004; 85% OM 2. HLD 3. HTN    ROS: All other ROS reviewed and negative. Pertinent positives noted in the HPI.     Studies Reviewed: .       NM Stress 07/05/2016 -normal perfusion  Physical Exam:   VS:  There were no vitals taken for this visit.   Wt Readings from Last 3 Encounters:  03/20/24 186 lb (84.4 kg)  12/20/23 187 lb (84.8 kg)  10/04/23 185 lb (83.9 kg)    GEN: Well nourished, well developed in no acute distress NECK: No JVD; No carotid bruits CARDIAC: ***RRR, no murmurs, rubs, gallops RESPIRATORY:  Clear to auscultation without rales, wheezing or rhonchi  ABDOMEN: Soft, non-tender, non-distended EXTREMITIES:  No edema; No deformity  ASSESSMENT AND PLAN: .   ***    {Are you ordering a CV Procedure (e.g. stress test, cath, DCCV, TEE, etc)?   Press F2        :[id number]}   Follow-up: No follow-ups on file.  Time Spent with Patient: I have spent a total of *** minutes caring for this patient today face to face, ordering and reviewing labs/tests, reviewing prior records/medical history, examining the patient, establishing an assessment and plan, communicating results/findings to the patient/family, and documenting in the medical record.   Signed, Lenna Gilford. Flora Lipps, MD, [facility]  [facility]  [address] [place], [place] [address] [phone]  6:38 PM

## 2024-03-22 ENCOUNTER — Ambulatory Visit: Payer: PPO | Attending: Cardiovascular Disease | Admitting: Cardiovascular Disease

## 2024-03-22 DIAGNOSIS — I2510 Atherosclerotic heart disease of native coronary artery without angina pectoris: Secondary | ICD-10-CM

## 2024-03-22 DIAGNOSIS — I150 Renovascular hypertension: Secondary | ICD-10-CM

## 2024-03-22 DIAGNOSIS — E782 Mixed hyperlipidemia: Secondary | ICD-10-CM

## 2024-03-26 ENCOUNTER — Telehealth: Payer: Self-pay | Admitting: Physical Medicine and Rehabilitation

## 2024-03-26 NOTE — Telephone Encounter (Signed)
 Patient called and said he needs a appointment before he goes to [place]. CB#[phone]

## 2024-03-28 ENCOUNTER — Ambulatory Visit: Admitting: Physical Medicine and Rehabilitation

## 2024-03-28 ENCOUNTER — Other Ambulatory Visit: Payer: Self-pay

## 2024-03-28 VITALS — BP 145/73 | HR 48

## 2024-03-28 DIAGNOSIS — M47816 Spondylosis without myelopathy or radiculopathy, lumbar region: Secondary | ICD-10-CM

## 2024-03-28 MED ORDER — METHYLPREDNISOLONE ACETATE 40 MG/ML IJ SUSP: 40.0000 mg | Freq: Once | INTRAMUSCULAR | Status: AC

## 2024-03-28 NOTE — Progress Notes (Unsigned)
 Pain Scale   Average Pain 0 Patient advising he has lower back pain radiating down bilateral legs at times        +Driver, -BT, -Dye Allergies.

## 2024-03-28 NOTE — Patient Instructions (Signed)

## 2024-03-29 DIAGNOSIS — M1611 Unilateral primary osteoarthritis, right hip: Secondary | ICD-10-CM | POA: Diagnosis not present

## 2024-03-29 DIAGNOSIS — M25551 Pain in right hip: Secondary | ICD-10-CM | POA: Diagnosis not present

## 2024-03-29 NOTE — Procedures (Signed)
 Lumbar Facet Joint Intra-Articular Injection(s) with Fluoroscopic Guidance  Patient: Raymond Alexander      Date of Birth: 1946/06/20 MRN: [id number] PCP: Lanae Pinal, MD      Visit Date: 03/28/2024   Universal Protocol:    Date/Time: 03/28/2024  Consent Given By: the patient  Position: PRONE   Additional Comments: Vital signs were monitored before and after the procedure. Patient was prepped and draped in the usual sterile fashion. The correct patient, procedure, and site was verified.   Injection Procedure Details:  Procedure Site One Meds Administered:  Meds ordered this encounter  Medications   methylPREDNISolone acetate (DEPO-MEDROL) injection 40 mg     Laterality: Bilateral  Location/Site:  L4-L5  Needle size: 22 guage  Needle type: Spinal  Needle Placement: Articular  Findings:  -Comments: Excellent flow of contrast producing a partial arthrogram.  Procedure Details: The fluoroscope beam is vertically oriented in AP, and the inferior recess is visualized beneath the lower pole of the inferior apophyseal process, which represents the target point for needle insertion. When direct visualization is difficult the target point is located at the medial projection of the vertebral pedicle. The region overlying each aforementioned target is locally anesthetized with a 1 to 2 ml. volume of 1% Lidocaine without Epinephrine.   The spinal needle was inserted into each of the above mentioned facet joints using biplanar fluoroscopic guidance. A 0.25 to 0.5 ml. volume of Isovue-250 was injected and a partial facet joint arthrogram was obtained. A single spot film was obtained of the resulting arthrogram.    One to 1.25 ml of the steroid/anesthetic solution was then injected into each of the facet joints noted above.   Additional Comments:  The patient tolerated the procedure well Dressing: 2 x 2 sterile gauze and Band-Aid    Post-procedure details: Patient was observed  during the procedure. Post-procedure instructions were reviewed.  Patient left the clinic in stable condition.

## 2024-03-29 NOTE — Progress Notes (Signed)
 Raymond Alexander - 78 y.o. male MRN [id number]  Date of birth: Sep 24, 1946  Office Visit Note: Visit Date: 03/28/2024 PCP: Darrow Bussing, MD Referred by: Darrow Bussing, MD  Subjective: Chief Complaint  Patient presents with   Lower Back - Pain   HPI:  Raymond Alexander is a 78 y.o. male who comes in today at the request of Ellin Goodie, FNP for planned Bilateral  L4-5 Lumbar facet/medial branch block with fluoroscopic guidance.  The patient has failed conservative care including home exercise, medications, time and activity modification.  This injection will be diagnostic and hopefully therapeutic.  Please see requesting physician notes for further details and justification.  Exam has shown concordant pain with facet joint loading.  I did go over the patient's MRI update with him.  Unfortunately the report is still not back from [facility] imaging.  Nonetheless the prior right sided facet joint cyst has resolved there is narrowing of the canal here moderate with lateral recess narrowing and very small listhesis which is progressed to her new since last MRI.  There is facet arthropathy above this area as well.  No focal disc herniations that are large.  There is a very small left-sided protrusion in the lateral recess at L3-4.  Depending on relief would consider epidural injection for more neurogenic claudication type symptoms at times.   ROS Otherwise per HPI.  Assessment & Plan: Visit Diagnoses:    ICD-10-CM   1. Spondylosis without myelopathy or radiculopathy, lumbar region  M47.816 XR C-ARM NO REPORT    Facet Injection    methylPREDNISolone acetate (DEPO-MEDROL) injection 40 mg      Plan: No additional findings.   Meds & Orders:  Meds ordered this encounter  Medications   methylPREDNISolone acetate (DEPO-MEDROL) injection 40 mg    Orders Placed This Encounter  Procedures   Facet Injection   XR C-ARM NO REPORT    Follow-up: Return if symptoms worsen or fail to improve.    Procedures: No procedures performed  Lumbar Facet Joint Intra-Articular Injection(s) with Fluoroscopic Guidance  Patient: Raymond Alexander      Date of Birth: 03-29-1946 MRN: [id number] PCP: Darrow Bussing, MD      Visit Date: 03/28/2024   Universal Protocol:    Date/Time: 03/28/2024  Consent Given By: the patient  Position: PRONE   Additional Comments: Vital signs were monitored before and after the procedure. Patient was prepped and draped in the usual sterile fashion. The correct patient, procedure, and site was verified.   Injection Procedure Details:  Procedure Site One Meds Administered:  Meds ordered this encounter  Medications   methylPREDNISolone acetate (DEPO-MEDROL) injection 40 mg     Laterality: Bilateral  Location/Site:  L4-L5  Needle size: 22 guage  Needle type: Spinal  Needle Placement: Articular  Findings:  -Comments: Excellent flow of contrast producing a partial arthrogram.  Procedure Details: The fluoroscope beam is vertically oriented in AP, and the inferior recess is visualized beneath the lower pole of the inferior apophyseal process, which represents the target point for needle insertion. When direct visualization is difficult the target point is located at the medial projection of the vertebral pedicle. The region overlying each aforementioned target is locally anesthetized with a 1 to 2 ml. volume of 1% Lidocaine without Epinephrine.   The spinal needle was inserted into each of the above mentioned facet joints using biplanar fluoroscopic guidance. A 0.25 to 0.5 ml. volume of Isovue-250 was injected and a partial facet joint arthrogram was  obtained. A single spot film was obtained of the resulting arthrogram.    One to 1.25 ml of the steroid/anesthetic solution was then injected into each of the facet joints noted above.   Additional Comments:  The patient tolerated the procedure well Dressing: 2 x 2 sterile gauze and Band-Aid     Post-procedure details: Patient was observed during the procedure. Post-procedure instructions were reviewed.  Patient left the clinic in stable condition.    Clinical History: Narrative & Impression CLINICAL DATA:  Right buttock pain radiating down the right leg since November of last year.   EXAM: MRI LUMBAR SPINE WITHOUT CONTRAST   TECHNIQUE: Multiplanar, multisequence MR imaging of the lumbar spine was performed. No intravenous contrast was administered.   COMPARISON:  None.   FINDINGS: Segmentation:  5 lumbar type vertebral bodies.   Alignment:  3 mm anterolisthesis L4-5.   Vertebrae: Benign appearing hemangioma within the T12 vertebral body. Smaller hemangioma within the L2 vertebral body. Superior endplate Schmorl's node at L3 and L4 without active edema.   Conus medullaris: Extends to the T12-L1 level and appears normal.   Paraspinal and other soft tissues: Negative   Disc levels:   T12-L1: Normal appearance of the disc. Hypertrophic facet degeneration right more than left encroaches upon the canal mildly but does not appear to cause neural compression. This level was not studied in the axial plane.   L1-2: Normal.   L2-3:  Normal.   L3-4: Shallow broad-based disc herniation slightly more prominent towards the right. Indentation of the thecal sac without evidence of neural compression. Mild facet osteoarthritis.   L4-5: Advanced bilateral facet arthropathy which allows 3 mm of anterolisthesis. Mild bulging of the disc. 8 mm synovial cyst projecting inward from the facet on the right. Narrowing of the lateral recesses, worse on the right because of the synovial cyst. This appearance could worsen with standing or flexion.   L5-S1: Bulging of the disc.  No stenosis.   IMPRESSION: Advanced bilateral facet arthropathy at L4-5 allowing 3 mm of anterolisthesis. 8 mm synovial cyst projecting inward from the facet on the right. Bulging of the disc.  Stenosis of the lateral recesses right more than left, worse on the right because of the synovial cyst. Right L5 nerve root compression is probably occurring because of this. This appearance could worsen with standing or flexion.   L3-4: Shallow broad-based disc herniation more prominent towards the right indents the thecal sac but does not appear to cause neural compression. Facet osteoarthritis at this level without slippage.     Electronically Signed   By: Paulina Fusi M.D.   On: 12/24/2016 15:21     Objective:  VS:  HT:    WT:   BMI:     BP:(!) 145/73  HR:(!) 48bpm  TEMP: ( )  RESP:  Physical Exam Vitals and nursing note reviewed.  Constitutional:      General: He is not in acute distress.    Appearance: Normal appearance. He is not ill-appearing.  HENT:     Head: Normocephalic and atraumatic.     Right Ear: External ear normal.     Left Ear: External ear normal.     Nose: No congestion.  Eyes:     Extraocular Movements: Extraocular movements intact.  Cardiovascular:     Rate and Rhythm: Normal rate.     Pulses: Normal pulses.  Pulmonary:     Effort: Pulmonary effort is normal. No respiratory distress.  Abdominal:  General: There is no distension.     Palpations: Abdomen is soft.  Musculoskeletal:        General: No tenderness or signs of injury.     Cervical back: Neck supple.     Right lower leg: No edema.     Left lower leg: No edema.     Comments: Patient has good distal strength without clonus.  Skin:    Findings: No erythema or rash.  Neurological:     General: No focal deficit present.     Mental Status: He is alert and oriented to person, place, and time.     Sensory: No sensory deficit.     Motor: No weakness or abnormal muscle tone.     Coordination: Coordination normal.  Psychiatric:        Mood and Affect: Mood normal.        Behavior: Behavior normal.      Imaging: XR C-ARM NO REPORT Result Date: 03/28/2024 Please see Notes tab for  imaging impression.

## 2024-04-11 NOTE — Progress Notes (Signed)
 [facility] - Clinic Note  04/24/2024    CHIEF COMPLAINT Patient presents for Retina Follow Up  HISTORY OF PRESENT ILLNESS: Raymond Alexander is a 78 y.o. male who presents to the clinic today for:   HPI     Retina Follow Up   Patient presents with  Wet AMD.  In both eyes.  This started 5 weeks ago.  I, the attending physician,  performed the HPI with the patient and updated documentation appropriately.        Comments   Patient here for 5 weeks retina follow up for exu ARMD OU. Patient states vision basically well. Has tiny floaters. Can't tell which eye. No eye pain.      Last edited by Whitten Andreoni, MD on 04/24/2024  5:44 PM.    Pt states he is seeing tiny floaters, but he can't tell which eye they are in, he thinks it's the left eye  Referring physician: Lanae Pinal, MD [address] [place],  [place] [address]  HISTORICAL INFORMATION:   Selected notes from the MEDICAL RECORD NUMBER Referred by Dr. Geralyn Knee for concern of retinal edema OU   CURRENT MEDICATIONS: No current outpatient medications on file. (Ophthalmic Drugs)   No current facility-administered medications for this visit. (Ophthalmic Drugs)   Current Outpatient Medications (Other)  Medication Sig   aspirin 81 MG tablet Take 81 mg by mouth daily.   atorvastatin (LIPITOR) 80 MG tablet Take 80 mg by mouth daily.   Coenzyme Q10 (CO Q-10) 100 MG CAPS Take 300 mg by mouth daily.   ezetimibe (ZETIA) 10 MG tablet Take 10 mg by mouth daily.   fexofenadine (ALLEGRA) 180 MG tablet Take 180 mg by mouth daily as needed for allergies.    fluticasone (FLONASE) 50 MCG/ACT nasal spray Place 1 spray into both nostrils daily. PRN   IBUPROFEN & ACETAMINOPHEN PO Take by mouth as needed.   lisinopril (PRINIVIL,ZESTRIL) 10 MG tablet Take 10 mg by mouth daily.   Mag Aspart-Potassium Aspart (POTASSIUM & MAGNESIUM ASPARTAT) 250-250 MG CAPS Take 1 tablet by mouth daily.   Multiple Vitamin  (MULTIVITAMIN) tablet Take 1 tablet by mouth daily.   nitroGLYCERIN  (NITROSTAT ) 0.4 MG SL tablet Place 1 tablet (0.4 mg total) under the tongue every 5 (five) minutes as needed for chest pain.   Omega-3 Fatty Acids (FISH OIL) 1000 MG CAPS Take 2 capsules by mouth daily.   OVER THE COUNTER MEDICATION Take 1 tablet by mouth daily. Med Name: PROTANDIM   sildenafil (VIAGRA) 50 MG tablet Take 50 mg by mouth daily as needed for erectile dysfunction.   Current Facility-Administered Medications (Other)  Medication Route   methylPREDNISolone  acetate (DEPO-MEDROL ) injection 40 mg Other   REVIEW OF SYSTEMS: ROS   Positive for: Cardiovascular, Eyes Negative for: Constitutional, Gastrointestinal, Neurological, Skin, Genitourinary, Musculoskeletal, HENT, Endocrine, Respiratory, Psychiatric, Allergic/Imm, Heme/Lymph Last edited by Sylvan Evener, COA on 04/24/2024  1:32 PM.     ALLERGIES No Known Allergies  PAST MEDICAL HISTORY Past Medical History:  Diagnosis Date   ASCVD (arteriosclerotic cardiovascular disease)    3 vessel   Clotting disorder (HCC)    Esophageal reflux    Hyperlipidemia    Hypertension    Macular degeneration    Past Surgical History:  Procedure Laterality Date   CATARACT EXTRACTION     PTCA  10/14/2003   mid RCA   FAMILY HISTORY Family History  Problem Relation Age of Onset   Heart attack  Father    Cancer Mother    Diabetes Sister    Cancer Brother    Healthy Brother    SOCIAL HISTORY Social History   Tobacco Use   Smoking status: Former   Smokeless tobacco: Never  Vaping Use   Vaping status: Never Used  Substance Use Topics   Alcohol use: Yes    Alcohol/week: 1.0 standard drink of alcohol    Types: 1 Glasses of wine per week    Comment: 1-2 per day   Drug use: No       OPHTHALMIC EXAM:  Base Eye Exam     Visual Acuity (Snellen - Linear)       Right Left   Dist cc 20/20 -1 20/20    Correction: Glasses         Tonometry (Tonopen,  1:29 PM)       Right Left   Pressure 18 16         Pupils       Dark Light Shape React APD   Right 3 2 Round Brisk None   Left 3 2 Round Brisk None         Visual Fields (Counting fingers)       Left Right    Full Full         Extraocular Movement       Right Left    Full, Ortho Full, Ortho         Neuro/Psych     Oriented x3: Yes   Mood/Affect: Normal         Dilation     Both eyes: 1.0% Mydriacyl, 2.5% Phenylephrine @ 1:29 PM           Slit Lamp and Fundus Exam     Slit Lamp Exam       Right Left   Lids/Lashes Normal small stye nasal LL margin   Conjunctiva/Sclera White and quiet White and quiet   Cornea arcus, well healed cataract wound, trace PEE arcus, well healed cataract wound, trace PEE   Anterior Chamber Deep and quiet Deep and quiet   Iris Round and dilated Round and dilated   Lens PC IOL in good position Toric PC IOL in good position with marks at 0530 and 1100   Anterior Vitreous Mild Vitreous syneresis, no cell or pigment, low lying Posterior vitreous detachment, Weiss ring Mild Vitreous syneresis, no cell or pigment, Posterior vitreous detachment         Fundus Exam       Right Left   Disc Pink and Sharp, mild peripapillary edema -- improved, ?peripapillary CNV ST to disc, no heme Pink and Sharp, peripapillary cystic changes, CNV ST disc, no heme   C/D Ratio 0.5 0.4   Macula Good foveal reflex, nasal cystic changes/edema -- stably improved, no heme Good foveal reflex, mild edema nasal macula, no heme   Vessels attenuated, Tortuous attenuated, Tortuous   Periphery Attached, No heme, no RT/RD Attached, No heme            Refraction     Wearing Rx       Sphere Cylinder Axis Add   Right [place] +0.25 060 +2.25   Left +0.25 Sphere  +2.25           IMAGING AND PROCEDURES  Imaging and Procedures for 04/24/2024  OCT, Retina - OU - Both Eyes        Right Eye Quality was good. Central Foveal Thickness: 287.  Progression has  improved. Findings include normal foveal contour, no SRF, retinal drusen , intraretinal fluid, pigment epithelial detachment (Interval improvement in peripapillary IRF/cystic changes, stable improvement in focal SRF superior to disc, no fluid centrally).   Left Eye Quality was good. Central Foveal Thickness: 272. Progression has been stable. Findings include normal foveal contour, no SRF, retinal drusen , intraretinal fluid (Persistent peripapillary IRF nasal macula -- slightly increased, SRF stably improved, no fluid centrally).   Notes  *Images captured and stored on drive  Diagnosis / Impression:  OD: Interval improvement in peripapillary IRF/cystic changes, stable improvement in focal SRF superior to disc, no fluid centrally OS: Persistent peripapillary IRF nasal macula -- slightly increased, SRF stably improved, no fluid centrally  Clinical management:  See below  Abbreviations: NFP - Normal foveal profile. CME - cystoid macular edema. PED - pigment epithelial detachment. IRF - intraretinal fluid. SRF - subretinal fluid. EZ - ellipsoid zone. ERM - epiretinal membrane. ORA - outer retinal atrophy. ORT - outer retinal tubulation. SRHM - subretinal hyper-reflective material. IRHM - intraretinal hyper-reflective material      Intravitreal Injection, Pharmacologic Agent - OS - Left Eye       Time Out 04/24/2024. 2:07 PM. Confirmed correct patient, procedure, site, and patient consented.   Anesthesia Topical anesthesia was used. Anesthetic medications included Lidocaine 2%, Proparacaine 0.5%.   Procedure Preparation included 5% betadine to ocular surface, eyelid speculum. A (32g) needle was used.   Injection: 1.25 mg Bevacizumab  1.25mg /0.47ml   Route: Intravitreal, Site: Left Eye   NDC: C2662926, Lot: [id number], Expiration date: 08/16/2024   Post-op Post injection exam found visual acuity of at least counting fingers. The patient tolerated the procedure well.  There were no complications. The patient received written and verbal post procedure care education. Post injection medications were not given.      Intravitreal Injection, Pharmacologic Agent - OD - Right Eye        Time Out 04/24/2024. 2:07 PM. Confirmed correct patient, procedure, site, and patient consented.   Anesthesia Topical anesthesia was used. Anesthetic medications included Lidocaine 2%, Proparacaine 0.5%.   Procedure Preparation included 5% betadine to ocular surface, eyelid speculum. A (32g) needle was used.   Injection: 6 mg faricimab -svoa 6 MG/0.05ML (Patient supplied)   Route: Intravitreal, Site: Right Eye   NDC: 45409-811-91, Lot: [id number], Expiration date: 08/12/2025, Waste: 0 mL   Post-op Post injection exam found visual acuity of at least counting fingers. The patient tolerated the procedure well. There were no complications. The patient received written and verbal post procedure care education. Post injection medications were not given.   Notes  **SAMPLE MEDICATION ADMINISTERED**           ASSESSMENT/PLAN:    ICD-10-CM   1. Exudative age-related macular degeneration of both eyes with active choroidal neovascularization (HCC)  H35.3231 OCT, Retina - OU - Both Eyes    Intravitreal Injection, Pharmacologic Agent - OS - Left Eye    Intravitreal Injection, Pharmacologic Agent - OD - Right Eye    Bevacizumab  (AVASTIN ) SOLN 1.25 mg    faricimab -svoa (VABYSMO ) 6mg /0.53mL intravitreal injection    2. Essential hypertension  I10     3. Hypertensive retinopathy of both eyes  H35.033     4. Pseudophakia, both eyes  Z96.1       1. Peripapillary CNV / exudative ARMD OU - 8.1.22 pt with 1 wk history of decreased vision OS and isolated light green flashes OD -- flashes OD resolved, blurriness OS improving  - 10.04.22 small  recurrent episode of flashes OD last week.   - 11.15.22 subjectively blurred vision OS - repeat FA (08.23.22) shows mild peripapillary  staining and ?leakage OU -- ?CNV  - unclear etiology, but suspect ARMD vs idiopathic CNV - s/p IVA OS #1 (11.15.22), #2 (12.13.22), #3 (01.10.23), #4 (02.09.23), #5 (01.29.25), #6 (02.26.25), #7 (04.07.25) - s/p IVA OD #1 (01.29.25), #2 (02.26.25) ==================================== - s/p IVE OS #1 (04.18.23), #2 (05.16.23), #3 (06.13.23), #4 (07.17.23), #5 (08.15.23), #6 (09.13.23), #7 (10.18.23), #8 (11.27.23), #9 (01.04.24), #10 (02.15.24), #11 (03.21.24), #12 (04.25.24), #13 (05.23.24), #14 (07.08.24), #15 (08.15.24), #16 (09.25.24), #17 (11.06.24), #18 (11.06.24) - s/p IVE OD #1 (07.17.23), #2 (08.15.23), #3 (09.13.23), #4 (10.18.23), #5 (11.27.23), #6 (01.04.24), #7 (02.15.24), #8 (03.21.24), #9 (04.25.24), #10 (05.23.24), #11 (07.08.24), #12 (08.15.24), #13 (09.25.24), #14 (11.06.24), #15 (11.06.24) ================================ - s/p IVV OD #1 (04.07.25, sample) =========================== - BCVA OD 20/25 from 20/20; OS 20/20 - stable - OCT shows OD: Interval improvement in peripapillary IRF/cystic changes, stable improvement in focal SRF superior to disc, no fluid centrally; OS: Persistent peripapillary IRF nasal macula -- slightly increased, SRF stably improved, no fluid centrally at 5 wks - recommend IVA OS #8 and IVV OD #2 (sample) today, 05.13.25 with follow up at 5 weeks again - pt wishes to proceed with injections  - RBA of procedure discussed, questions answered - IVA informed consent obtained and signed, 01.29.25 (OU) - IVV informed consent obtained and signed, 04.09.25 (OD) - see procedure note   - cont Amsler grid monitoring  - Eylea  approved for 2025, but [employer] funding unavailable  - f/u 5 weeks, DFE, OCT, possible injections, tx and ext as able  2,3. Hypertensive retinopathy OU - discussed importance of tight BP control - continue to monitor  4. Pseudophakia OU  - s/p CE/IOL (Dr. Lenna Quinton, 2022)  - IOLs in good position, doing well - continue to  monitor  Ophthalmic Meds Ordered this visit:  Meds ordered this encounter  Medications   Bevacizumab  (AVASTIN ) SOLN 1.25 mg   faricimab -svoa (VABYSMO ) 6mg /0.44mL intravitreal injection     Return in about 5 weeks (around 05/29/2024) for f/u exu ARMD OU, DFE, OCT, Possible Injxn.  There are no Patient Instructions on file for this visit.  This document serves as a record of services personally performed by Jeanice Millard, MD, PhD. It was created on their behalf by Olene Berne, COT an ophthalmic technician. The creation of this record is the provider's dictation and/or activities during the visit.    Electronically signed by:  Olene Berne, COT  04/27/24 1:54 AM  This document serves as a record of services personally performed by Jeanice Millard, MD, PhD. It was created on their behalf by Morley Arabia. Bevin Bucks, OA an ophthalmic technician. The creation of this record is the provider's dictation and/or activities during the visit.    Electronically signed by: Morley Arabia. Bevin Bucks, OA 04/27/24 1:54 AM  Jeanice Millard, M.D., Ph.D. Diseases & Surgery of the Retina and Vitreous [facility]  I have reviewed the above documentation for accuracy and completeness, and I agree with the above. Jeanice Millard, M.D., Ph.D. 04/27/24 1:56 AM  Abbreviations: M myopia (nearsighted); A astigmatism; H hyperopia (farsighted); P presbyopia; Mrx spectacle prescription;  CTL contact lenses; OD right eye; OS left eye; OU both eyes  XT exotropia; ET esotropia; PEK punctate epithelial keratitis; PEE punctate epithelial erosions; DES dry eye syndrome; MGD meibomian gland dysfunction; ATs artificial tears; PFAT's  preservative free artificial tears; NSC nuclear sclerotic cataract; PSC posterior subcapsular cataract; ERM epi-retinal membrane; PVD posterior vitreous detachment; RD retinal detachment; DM diabetes mellitus; DR diabetic retinopathy; NPDR non-proliferative diabetic retinopathy;  PDR proliferative diabetic retinopathy; CSME clinically significant macular edema; DME diabetic macular edema; dbh dot blot hemorrhages; CWS cotton wool spot; POAG primary open angle glaucoma; C/D cup-to-disc ratio; HVF humphrey visual field; GVF goldmann visual field; OCT optical coherence tomography; IOP intraocular pressure; BRVO Branch retinal vein occlusion; CRVO central retinal vein occlusion; CRAO central retinal artery occlusion; BRAO branch retinal artery occlusion; RT retinal tear; SB scleral buckle; PPV pars plana vitrectomy; VH Vitreous hemorrhage; PRP panretinal laser photocoagulation; IVK intravitreal kenalog; VMT vitreomacular traction; MH Macular hole;  NVD neovascularization of the disc; NVE neovascularization elsewhere; AREDS age related eye disease study; ARMD age related macular degeneration; POAG primary open angle glaucoma; EBMD epithelial/anterior basement membrane dystrophy; ACIOL anterior chamber intraocular lens; IOL intraocular lens; PCIOL posterior chamber intraocular lens; Phaco/IOL phacoemulsification with intraocular lens placement; PRK photorefractive keratectomy; LASIK laser assisted in situ keratomileusis; HTN hypertension; DM diabetes mellitus; COPD chronic obstructive pulmonary disease

## 2024-04-12 ENCOUNTER — Telehealth: Payer: Self-pay

## 2024-04-12 NOTE — Telephone Encounter (Signed)
 [place]. He will call back to schedule MRI review when he returns.

## 2024-04-24 ENCOUNTER — Ambulatory Visit (INDEPENDENT_AMBULATORY_CARE_PROVIDER_SITE_OTHER): Admitting: Ophthalmology

## 2024-04-24 ENCOUNTER — Encounter (INDEPENDENT_AMBULATORY_CARE_PROVIDER_SITE_OTHER): Payer: Self-pay | Admitting: Ophthalmology

## 2024-04-24 DIAGNOSIS — I10 Essential (primary) hypertension: Secondary | ICD-10-CM | POA: Diagnosis not present

## 2024-04-24 DIAGNOSIS — H353231 Exudative age-related macular degeneration, bilateral, with active choroidal neovascularization: Secondary | ICD-10-CM

## 2024-04-24 DIAGNOSIS — Z961 Presence of intraocular lens: Secondary | ICD-10-CM

## 2024-04-24 DIAGNOSIS — H35033 Hypertensive retinopathy, bilateral: Secondary | ICD-10-CM | POA: Diagnosis not present

## 2024-04-24 MED ORDER — FARICIMAB-SVOA 6 MG/0.05ML IZ SOLN
6.0000 mg | INTRAVITREAL | Status: AC | PRN
  Administered 2024-04-24: 6 mg via INTRAVITREAL

## 2024-04-24 MED ORDER — BEVACIZUMAB CHEMO INJECTION 1.25MG/0.05ML SYRINGE FOR KALEIDOSCOPE
1.2500 mg | INTRAVITREAL | Status: AC | PRN
  Administered 2024-04-24: 1.25 mg via INTRAVITREAL

## 2024-05-04 ENCOUNTER — Telehealth: Payer: Self-pay | Admitting: Physical Medicine and Rehabilitation

## 2024-05-04 NOTE — Telephone Encounter (Signed)
 Patient called and said he wants to get an appointment for MRI Review. CB#[id number]

## 2024-05-08 DIAGNOSIS — R051 Acute cough: Secondary | ICD-10-CM | POA: Diagnosis not present

## 2024-05-08 DIAGNOSIS — I700 Atherosclerosis of aorta: Secondary | ICD-10-CM | POA: Diagnosis not present

## 2024-05-18 ENCOUNTER — Ambulatory Visit: Admitting: Physical Medicine and Rehabilitation

## 2024-05-18 ENCOUNTER — Encounter: Payer: Self-pay | Admitting: Physical Medicine and Rehabilitation

## 2024-05-18 DIAGNOSIS — G8929 Other chronic pain: Secondary | ICD-10-CM

## 2024-05-18 DIAGNOSIS — M47816 Spondylosis without myelopathy or radiculopathy, lumbar region: Secondary | ICD-10-CM

## 2024-05-18 DIAGNOSIS — M545 Low back pain, unspecified: Secondary | ICD-10-CM | POA: Diagnosis not present

## 2024-05-18 NOTE — Progress Notes (Signed)
 Pain Scale   Average Pain 0 Patient advising he has lower back pain , injection in L4 and L5 done and pt advising he is doing much better.        +Driver, -BT, -Dye Allergies.

## 2024-05-18 NOTE — Progress Notes (Signed)
 Raymond Alexander - 78 y.o. male MRN [id number]  Date of birth: 07-10-1946  Office Visit Note: Visit Date: 05/18/2024 PCP: Lanae Pinal, MD Referred by: Lanae Pinal, MD  Subjective: Chief Complaint  Patient presents with   Lower Back - Follow-up   HPI: Raymond Alexander is a 78 y.o. male who comes in today for evaluation of chronic bilateral lower back pain, intermittent radiation of pain to right anterior lower leg. Pain ongoing for several years, worsens with prolonged sitting. Also reports pain when moving from sitting to standing position. He describes pain as sore and stiff sensation, currently denies pain at this time. Some relief of pain with home exercise regimen, rest and use of medications. History of formal physical therapy with [facility], PT and reports significant relief of pain with therapies/dry needling. Recent lumbar MRI imaging shows grade 1 anterolisthesis of L4 on L5. Mild foraminal stenosis at multiple levels secondary to disc bulging and facet arthropathy as detailed above. There is severe facet arthropathy at L4-L5. Moderate facet arthropathy at multiple levels. No high grade spinal canal stenosis noted. He recently underwent bilateral L4-L5 facet joint injections in our office on 03/28/2024, he reports significant pain relief with this procedure, he is currently pain free, states this injection helped both lower back and right leg discomfort. He also reports increased functional ability post injection. Patient denies focal weakness, numbness and tingling. No recent trauma or falls.       Review of Systems  Musculoskeletal:  Negative for back pain.  Neurological:  Negative for tingling, sensory change, focal weakness and weakness.  All other systems reviewed and are negative.  Otherwise per HPI.  Assessment & Plan: Visit Diagnoses:    ICD-10-CM   1. Chronic bilateral low back pain without sciatica  M54.50    G89.29     2. Spondylosis without myelopathy or  radiculopathy, lumbar region  M47.816     3. Facet arthropathy, lumbar  M47.816        Plan: Findings:  Chronic bilateral lower back pain, intermittent radiation of pain to right anterior lower leg. Complete resolution of pain with recent bilateral L4-L5 facet joint injections. He continues with home exercise regimen, rest and use of medications. Patients clinical presentation and exam are consistent with facet mediated pain. There is multi level moderate facet arthropathy, most severe at the level of L4-L5. I discussed treatment plan with patient in detail today. At this point, would continue to monitor, we can repeat this injection infrequently as needed. I encouraged him to continue with core strengthening and PT treatments. I did provide him with Dr. Mark Sil McGill's "Big Three" exercises to start at home. He has no questions at this time. I instructed him to follow up as needed. No red flag symptoms noted upon exam today.     Meds & Orders: No orders of the defined types were placed in this encounter.  No orders of the defined types were placed in this encounter.   Follow-up: Return if symptoms worsen or fail to improve.   Procedures: No procedures performed      Clinical History: CLINICAL DATA:  Low back pain   EXAM: MRI LUMBAR SPINE WITHOUT CONTRAST   TECHNIQUE: Multiplanar, multisequence MR imaging of the lumbar spine was performed. No intravenous contrast was administered.   COMPARISON:  MRI lumbar spine dated 12/24/2016   FINDINGS: Segmentation: Standard.   Alignment:  Grade 1 anterolisthesis of L4 on L5.   Vertebrae: Schmorl's nodes at multiple levels.  Areas of T1/T2 hyperintensity in lumbar vertebrae most consistent with intraosseous hemangioma or focal fat. No compression fractures.   Conus medullaris and cauda equina: The conus medullaris terminates at the level of L1-L2. The distal spinal cord signal intensity is normal.   Paraspinal and other soft tissues:  The visualized abdomen and pelvis show no soft tissue abnormality. The visualized aorta is normal.   Disc levels:   L1-L2: Disc is normal in configuration. Mild bilateral facet arthropathy. No neuroforaminal stenosis. No spinal canal stenosis.   L2-L3: Disc bulge. Moderate facet arthropathy. No neuroforaminal stenosis. No spinal canal stenosis.   L3-L4: Disc bulge. Moderate bilateral facet arthropathy. Mild bilateral neuroforaminal stenosis. No spinal canal stenosis.   L4-L5: Disc bulge. Severe bilateral facet arthropathy. Mild bilateral neuroforaminal stenosis. Mild spinal canal stenosis.   L5-S1: Disc bulge. Moderate bilateral facet arthropathy. Mild left neuroforaminal stenosis. No spinal canal stenosis.   IMPRESSION: 1. Mild canal stenosis at L4-L5 secondary to disc bulging and facet arthropathy. 2. Mild foraminal stenosis at multiple levels secondary to disc bulging and facet arthropathy as detailed above. 3. Schmorl's nodes at multiple levels, several new since prior examination in 2018.     Electronically Signed   By: Johnanna Mylar M.D.   On: 04/12/2024 11:19   He reports that he has quit smoking. He has never used smokeless tobacco. No results for input(s): "HGBA1C", "LABURIC" in the last 8760 hours.  Objective:  VS:  HT:    WT:   BMI:     BP:   HR: bpm  TEMP: ( )  RESP:  Physical Exam Vitals and nursing note reviewed.  HENT:     Head: Normocephalic and atraumatic.     Right Ear: External ear normal.     Left Ear: External ear normal.     Nose: Nose normal.     Mouth/Throat:     Mouth: Mucous membranes are moist.  Eyes:     Extraocular Movements: Extraocular movements intact.  Cardiovascular:     Rate and Rhythm: Normal rate.     Pulses: Normal pulses.  Pulmonary:     Effort: Pulmonary effort is normal.  Abdominal:     General: Abdomen is flat. There is no distension.  Musculoskeletal:        General: No tenderness.     Cervical back: Normal range  of motion.     Comments: Patient rises from seated position to standing without difficulty. Good lumbar range of motion. No pain noted with facet loading. 5/5 strength noted with bilateral hip flexion, knee flexion/extension, ankle dorsiflexion/plantarflexion and EHL. No clonus noted bilaterally. No pain upon palpation of greater trochanters. No pain with internal/external rotation of bilateral hips. Sensation intact bilaterally. Negative slump test bilaterally. Ambulates without aid, gait steady.     Skin:    General: Skin is warm and dry.     Capillary Refill: Capillary refill takes less than 2 seconds.  Neurological:     General: No focal deficit present.     Mental Status: He is alert and oriented to person, place, and time.  Psychiatric:        Mood and Affect: Mood normal.        Behavior: Behavior normal.     Ortho Exam  Imaging: No results found.  Past Medical/Family/Surgical/Social History: Medications & Allergies reviewed per EMR, new medications updated. Patient Active Problem List   Diagnosis Date Noted   Bilateral hip joint arthritis 12/20/2023   Right hip pain 10/04/2023  Left elbow pain 08/02/2023   AC (acromioclavicular) arthritis 05/18/2023   Coronary atherosclerosis of native coronary artery 06/13/2014   Essential hypertension, benign 06/13/2014   Hyperlipidemia 06/13/2014   Past Medical History:  Diagnosis Date   ASCVD (arteriosclerotic cardiovascular disease)    3 vessel   Clotting disorder (HCC)    Esophageal reflux    Hyperlipidemia    Hypertension    Macular degeneration    Family History  Problem Relation Age of Onset   Heart attack Father    Cancer Mother    Diabetes Sister    Cancer Brother    Healthy Brother    Past Surgical History:  Procedure Laterality Date   CATARACT EXTRACTION     PTCA  10/14/2003   mid RCA   Social History   Occupational History   Not on file  Tobacco Use   Smoking status: Former   Smokeless tobacco: Never   Vaping Use   Vaping status: Never Used  Substance and Sexual Activity   Alcohol use: Yes    Alcohol/week: 1.0 standard drink of alcohol    Types: 1 Glasses of wine per week    Comment: 1-2 per day   Drug use: No   Sexual activity: Yes

## 2024-05-18 NOTE — Patient Instructions (Signed)

## 2024-05-22 NOTE — Progress Notes (Signed)
 [facility] - Clinic Note  05/29/2024    CHIEF COMPLAINT Patient presents for Retina Follow Up  HISTORY OF PRESENT ILLNESS: Raymond Alexander is a 78 y.o. male who presents to the clinic today for:   HPI     Retina Follow Up   Patient presents with  Wet AMD.  In both eyes.  This started 5 weeks ago.  I, the attending physician,  performed the HPI with the patient and updated documentation appropriately.        Comments   Patient here for 5 weeks retina follow up for exu ARMD OU. Patient states vision doing ok. Has tiny little floaters. No eye pian.      Last edited by Valdemar Rogue, MD on 05/29/2024 11:19 PM.    Pt states VA is stable, just seeing a little floater.   Referring physician: Regino Slater, MD [address] [place],  [place] [address]  HISTORICAL INFORMATION:   Selected notes from the MEDICAL RECORD NUMBER Referred by Dr. Glendia for concern of retinal edema OU   CURRENT MEDICATIONS: No current outpatient medications on file. (Ophthalmic Drugs)   No current facility-administered medications for this visit. (Ophthalmic Drugs)   Current Outpatient Medications (Other)  Medication Sig   aspirin 81 MG tablet Take 81 mg by mouth daily.   atorvastatin (LIPITOR) 80 MG tablet Take 80 mg by mouth daily.   ezetimibe (ZETIA) 10 MG tablet Take 10 mg by mouth daily.   fexofenadine (ALLEGRA) 180 MG tablet Take 180 mg by mouth daily as needed for allergies.    fluticasone (FLONASE) 50 MCG/ACT nasal spray Place 1 spray into both nostrils daily. PRN   IBUPROFEN & ACETAMINOPHEN PO Take by mouth as needed.   lisinopril (PRINIVIL,ZESTRIL) 10 MG tablet Take 10 mg by mouth daily.   Mag Aspart-Potassium Aspart (POTASSIUM & MAGNESIUM ASPARTAT) 250-250 MG CAPS Take 1 tablet by mouth daily.   Multiple Vitamin (MULTIVITAMIN) tablet Take 1 tablet by mouth daily.   nitroGLYCERIN  (NITROSTAT ) 0.4 MG SL tablet Place 1 tablet (0.4 mg total) under the tongue  every 5 (five) minutes as needed for chest pain.   Omega-3 Fatty Acids (FISH OIL) 1000 MG CAPS Take 2 capsules by mouth daily.   OVER THE COUNTER MEDICATION Take 1 tablet by mouth daily. Med Name: PROTANDIM   Coenzyme Q10 (CO Q-10) 100 MG CAPS Take 300 mg by mouth daily.   sildenafil (VIAGRA) 50 MG tablet Take 50 mg by mouth daily as needed for erectile dysfunction.   Current Facility-Administered Medications (Other)  Medication Route   methylPREDNISolone  acetate (DEPO-MEDROL ) injection 40 mg Other   REVIEW OF SYSTEMS: ROS   Positive for: Cardiovascular, Eyes Negative for: Constitutional, Gastrointestinal, Neurological, Skin, Genitourinary, Musculoskeletal, HENT, Endocrine, Respiratory, Psychiatric, Allergic/Imm, Heme/Lymph Last edited by Orval Asberry RAMAN, COA on 05/29/2024  2:00 PM.      ALLERGIES No Known Allergies  PAST MEDICAL HISTORY Past Medical History:  Diagnosis Date   ASCVD (arteriosclerotic cardiovascular disease)    3 vessel   Clotting disorder (HCC)    Esophageal reflux    Hyperlipidemia    Hypertension    Macular degeneration    Past Surgical History:  Procedure Laterality Date   CATARACT EXTRACTION     PTCA  10/14/2003   mid RCA   FAMILY HISTORY Family History  Problem Relation Age of Onset   Heart attack Father    Cancer Mother    Diabetes Sister  Cancer Brother    Healthy Brother    SOCIAL HISTORY Social History   Tobacco Use   Smoking status: Former   Smokeless tobacco: Never  Vaping Use   Vaping status: Never Used  Substance Use Topics   Alcohol use: Yes    Alcohol/week: 1.0 standard drink of alcohol    Types: 1 Glasses of wine per week    Comment: 1-2 per day   Drug use: No       OPHTHALMIC EXAM:  Base Eye Exam     Visual Acuity (Snellen - Linear)       Right Left   Dist cc 20/20 20/20    Correction: Glasses         Tonometry (Tonopen, 1:58 PM)       Right Left   Pressure 14 13         Pupils       Dark  Light Shape React APD   Right 3 2 Round Brisk None   Left 3 2 Round Brisk None         Visual Fields (Counting fingers)       Left Right    Full Full         Neuro/Psych     Oriented x3: Yes   Mood/Affect: Normal         Dilation     Both eyes: 1.0% Mydriacyl, 2.5% Phenylephrine @ 1:58 PM           Slit Lamp and Fundus Exam     Slit Lamp Exam       Right Left   Lids/Lashes Normal small stye nasal LL margin   Conjunctiva/Sclera White and quiet White and quiet   Cornea arcus, well healed cataract wound, trace PEE arcus, well healed cataract wound, trace PEE   Anterior Chamber Deep and quiet Deep and quiet   Iris Round and dilated Round and dilated   Lens PC IOL in good position Toric PC IOL in good position with marks at 0530 and 1100   Anterior Vitreous Mild Vitreous syneresis, no cell or pigment, low lying Posterior vitreous detachment, Weiss ring Mild Vitreous syneresis, no cell or pigment, Posterior vitreous detachment         Fundus Exam       Right Left   Disc Pink and Sharp, mild peripapillary edema -- improved, ?peripapillary CNV ST to disc, no heme Pink and Sharp, peripapillary cystic changes--improved, CNV ST disc, no heme   C/D Ratio 0.5 0.4   Macula Good foveal reflex, nasal cystic changes/edema -- stably improved, no heme Good foveal reflex, mild edema nasal macula--improved, no heme   Vessels attenuated, Tortuous attenuated, Tortuous   Periphery Attached, No heme, no RT/RD Attached, No heme            Refraction     Wearing Rx       Sphere Cylinder Axis Add   Right [place] +0.25 060 +2.25   Left +0.25 Sphere  +2.25           IMAGING AND PROCEDURES  Imaging and Procedures for 05/29/2024  OCT, Retina - OU - Both Eyes       Right Eye Quality was good. Central Foveal Thickness: 288. Progression has improved. Findings include normal foveal contour, no SRF, retinal drusen , intraretinal fluid, pigment epithelial detachment (Interval  improvement in peripapillary IRF/cystic changes, stable improvement in focal SRF superior to disc, no fluid centrally).   Left Eye Quality was good. Central  Foveal Thickness: 272. Progression has improved. Findings include normal foveal contour, no SRF, retinal drusen , intraretinal fluid (Interval improvement peripapillary IRF nasal macula, SRF stably improved, no fluid centrally).   Notes *Images captured and stored on drive  Diagnosis / Impression:  OD: Interval improvement in peripapillary IRF/cystic changes, stable improvement in focal SRF superior to disc, no fluid centrally OS: Interval improvement peripapillary IRF nasal macula, SRF stably improved, no fluid centrally  Clinical management:  See below  Abbreviations: NFP - Normal foveal profile. CME - cystoid macular edema. PED - pigment epithelial detachment. IRF - intraretinal fluid. SRF - subretinal fluid. EZ - ellipsoid zone. ERM - epiretinal membrane. ORA - outer retinal atrophy. ORT - outer retinal tubulation. SRHM - subretinal hyper-reflective material. IRHM - intraretinal hyper-reflective material      Intravitreal Injection, Pharmacologic Agent - OD - Right Eye       Time Out 05/29/2024. 3:27 PM. Confirmed correct patient, procedure, site, and patient consented.   Anesthesia Topical anesthesia was used. Anesthetic medications included Lidocaine 2%, Proparacaine 0.5%.   Procedure Preparation included 5% betadine to ocular surface, eyelid speculum. A (32g) needle was used.   Injection: 6 mg faricimab -svoa 6 MG/0.05ML (Patient supplied)   Route: Intravitreal, Site: Right Eye   NDC: 49757-903-98, Lot: [id number], Expiration date: 09/11/2025, Waste: 0 mL   Post-op Post injection exam found visual acuity of at least counting fingers. The patient tolerated the procedure well. There were no complications. The patient received written and verbal post procedure care education. Post injection medications were not given.    Notes **SAMPLE MEDICATION ADMINISTERED**     Intravitreal Injection, Pharmacologic Agent - OS - Left Eye       Time Out 05/29/2024. 3:27 PM. Confirmed correct patient, procedure, site, and patient consented.   Anesthesia Topical anesthesia was used. Anesthetic medications included Lidocaine 2%, Proparacaine 0.5%.   Procedure Preparation included 5% betadine to ocular surface, eyelid speculum. A (32g) needle was used.   Injection: 1.25 mg Bevacizumab  1.25mg /0.38ml   Route: Intravitreal, Site: Left Eye   NDC: C2662926, Lot: [id number], Expiration date: 03/19/2024   Post-op Post injection exam found visual acuity of at least counting fingers. The patient tolerated the procedure well. There were no complications. The patient received written and verbal post procedure care education. Post injection medications were not given.             ASSESSMENT/PLAN:    ICD-10-CM   1. Exudative age-related macular degeneration of both eyes with active choroidal neovascularization (HCC)  H35.3231 OCT, Retina - OU - Both Eyes    Intravitreal Injection, Pharmacologic Agent - OD - Right Eye    Intravitreal Injection, Pharmacologic Agent - OS - Left Eye    Bevacizumab  (AVASTIN ) SOLN 1.25 mg    faricimab -svoa (VABYSMO ) 6mg /0.79mL intravitreal injection    2. Essential hypertension  I10     3. Hypertensive retinopathy of both eyes  H35.033     4. Pseudophakia, both eyes  Z96.1      1. Peripapillary CNV / exudative ARMD OU - 8.1.22 pt with 1 wk history of decreased vision OS and isolated light green flashes OD -- flashes OD resolved, blurriness OS improving  - 10.04.22 small recurrent episode of flashes OD last week.   - 11.15.22 subjectively blurred vision OS - repeat FA (08.23.22) shows mild peripapillary staining and ?leakage OU -- ?CNV  - unclear etiology, but suspect ARMD vs idiopathic CNV - s/p IVA OS #1 (11.15.22), #2 (12.13.22), #3 (01.10.23), #4 (  02.09.23), #5 (01.29.25), #6  (02.26.25), #7 (04.07.25), #8 (05.13.25) - s/p IVA OD #1 (01.29.25), #2 (02.26.25) ==================================== - s/p IVE OS #1 (04.18.23), #2 (05.16.23), #3 (06.13.23), #4 (07.17.23), #5 (08.15.23), #6 (09.13.23), #7 (10.18.23), #8 (11.27.23), #9 (01.04.24), #10 (02.15.24), #11 (03.21.24), #12 (04.25.24), #13 (05.23.24), #14 (07.08.24), #15 (08.15.24), #16 (09.25.24), #17 (11.06.24), #18 (11.06.24) - s/p IVE OD #1 (07.17.23), #2 (08.15.23), #3 (09.13.23), #4 (10.18.23), #5 (11.27.23), #6 (01.04.24), #7 (02.15.24), #8 (03.21.24), #9 (04.25.24), #10 (05.23.24), #11 (07.08.24), #12 (08.15.24), #13 (09.25.24), #14 (11.06.24), #15 (11.06.24) ================================ - s/p IVV OD #1 (04.07.25, sample), #2 (05.13.25 sample) =========================== - BCVA OD 20/25 from 20/20; OS 20/20 - stable - OCT shows OD: Interval improvement in peripapillary IRF/cystic changes, stable improvement in focal SRF superior to disc, no fluid centrally; OS: Interval improvement peripapillary IRF nasal macula, SRF stably improved, no fluid centrally at 5 wks - recommend IVA OS #9 and IVV OD #3 (sample) today, 06.17.25 with follow up ext to 6wks - pt wishes to proceed with injections  - RBA of procedure discussed, questions answered - IVA informed consent obtained and signed, 01.29.25 (OU) - IVV informed consent obtained and signed, 04.09.25 (OD) - see procedure note   - cont Amsler grid monitoring  - Eylea  approved for 2025, but Good Days funding unavailable  - f/u ext to 6 weeks, DFE, OCT  2,3. Hypertensive retinopathy OU - discussed importance of tight BP control - continue to monitor  4. Pseudophakia OU  - s/p CE/IOL (Dr. Lavonia, 2022)  - IOLs in good position, doing well - continue to monitor  Ophthalmic Meds Ordered this visit:  Meds ordered this encounter  Medications   Bevacizumab  (AVASTIN ) SOLN 1.25 mg   faricimab -svoa (VABYSMO ) 6mg /0.60mL intravitreal injection     Return in  about 6 weeks (around 07/10/2024) for CNV/exu ARMD OU, DFE, OCT, Possible Injxn.  There are no Patient Instructions on file for this visit.  This document serves as a record of services personally performed by Redell JUDITHANN Hans, MD, PhD. It was created on their behalf by Almetta Pesa, an ophthalmic technician. The creation of this record is the provider's dictation and/or activities during the visit.    Electronically signed by: Almetta Pesa, OA, 06/03/24  11:56 AM   Redell JUDITHANN Hans, M.D., Ph.D. Diseases & Surgery of the Retina and Vitreous [facility] [place]  I have reviewed the above documentation for accuracy and completeness, and I agree with the above. Redell JUDITHANN Hans, M.D., Ph.D. 06/03/24 12:00 PM   Abbreviations: M myopia (nearsighted); A astigmatism; H hyperopia (farsighted); P presbyopia; Mrx spectacle prescription;  CTL contact lenses; OD right eye; OS left eye; OU both eyes  XT exotropia; ET esotropia; PEK punctate epithelial keratitis; PEE punctate epithelial erosions; DES dry eye syndrome; MGD meibomian gland dysfunction; ATs artificial tears; PFAT's preservative free artificial tears; NSC nuclear sclerotic cataract; PSC posterior subcapsular cataract; ERM epi-retinal membrane; PVD posterior vitreous detachment; RD retinal detachment; DM diabetes mellitus; DR diabetic retinopathy; NPDR non-proliferative diabetic retinopathy; PDR proliferative diabetic retinopathy; CSME clinically significant macular edema; DME diabetic macular edema; dbh dot blot hemorrhages; CWS cotton wool spot; POAG primary open angle glaucoma; C/D cup-to-disc ratio; HVF humphrey visual field; GVF goldmann visual field; OCT optical coherence tomography; IOP intraocular pressure; BRVO Branch retinal vein occlusion; CRVO central retinal vein occlusion; CRAO central retinal artery occlusion; BRAO branch retinal artery occlusion; RT retinal tear; SB scleral buckle; PPV pars plana vitrectomy; VH  Vitreous hemorrhage; PRP panretinal laser  photocoagulation; IVK intravitreal kenalog; VMT vitreomacular traction; MH Macular hole;  NVD neovascularization of the disc; NVE neovascularization elsewhere; AREDS age related eye disease study; ARMD age related macular degeneration; POAG primary open angle glaucoma; EBMD epithelial/anterior basement membrane dystrophy; ACIOL anterior chamber intraocular lens; IOL intraocular lens; PCIOL posterior chamber intraocular lens; Phaco/IOL phacoemulsification with intraocular lens placement; PRK photorefractive keratectomy; LASIK laser assisted in situ keratomileusis; HTN hypertension; DM diabetes mellitus; COPD chronic obstructive pulmonary disease

## 2024-05-29 ENCOUNTER — Ambulatory Visit (INDEPENDENT_AMBULATORY_CARE_PROVIDER_SITE_OTHER): Admitting: Ophthalmology

## 2024-05-29 ENCOUNTER — Encounter (INDEPENDENT_AMBULATORY_CARE_PROVIDER_SITE_OTHER): Payer: Self-pay | Admitting: Ophthalmology

## 2024-05-29 DIAGNOSIS — I10 Essential (primary) hypertension: Secondary | ICD-10-CM | POA: Diagnosis not present

## 2024-05-29 DIAGNOSIS — H353231 Exudative age-related macular degeneration, bilateral, with active choroidal neovascularization: Secondary | ICD-10-CM | POA: Diagnosis not present

## 2024-05-29 DIAGNOSIS — Z961 Presence of intraocular lens: Secondary | ICD-10-CM

## 2024-05-29 DIAGNOSIS — H35033 Hypertensive retinopathy, bilateral: Secondary | ICD-10-CM

## 2024-05-29 MED ORDER — FARICIMAB-SVOA 6 MG/0.05ML IZ SOLN
6.0000 mg | INTRAVITREAL | Status: AC | PRN
  Administered 2024-05-29: 6 mg via INTRAVITREAL

## 2024-05-29 MED ORDER — BEVACIZUMAB CHEMO INJECTION 1.25MG/0.05ML SYRINGE FOR KALEIDOSCOPE
1.2500 mg | INTRAVITREAL | Status: AC | PRN
  Administered 2024-05-29: 1.25 mg via INTRAVITREAL

## 2024-07-03 NOTE — Progress Notes (Unsigned)
 [facility] [address] [place] [address] Phone: [phone] Subjective:   ISusannah Alexander, am serving as a scribe for Dr. Arthea Claudene.  I'm seeing this patient by the request  of:  Koirala, Dibas, MD  CC: Shoulder pain follow-up, new left knee pain  YEP:Dlagzrupcz  03/20/2024 Seeing orthopedic surgery in the near future.  Does have underlying also back discomfort and is seeing another provider for potential epidural in the near future.   Significant decrease in the hypoechoic changes that was noted previously.  Will have her follow-up again in 3 months.  Discussed limiting certain range of motion.     Stable at the moment no changes     Updated 07/04/2024 Raymond Alexander is a 78 y.o. male coming in with complaint of hip, shoulder, and elbow pain. R Hip is still painful when trying to tie R shoelace. Surgeon says probably caused by spur. Shoulder is doing okay. Played ping pong the other day and pain went away over night. Able to play the next day without difficulty. L knee twang, no pain just a feeling.      Past Medical History:  Diagnosis Date   ASCVD (arteriosclerotic cardiovascular disease)    3 vessel   Clotting disorder (HCC)    Esophageal reflux    Hyperlipidemia    Hypertension    Macular degeneration    Past Surgical History:  Procedure Laterality Date   CATARACT EXTRACTION     PTCA  10/14/2003   mid RCA   Social History   Socioeconomic History   Marital status: Married    Spouse name: Not on file   Number of children: Not on file   Years of education: Not on file   Highest education level: Not on file  Occupational History   Not on file  Tobacco Use   Smoking status: Former   Smokeless tobacco: Never  Vaping Use   Vaping status: Never Used  Substance and Sexual Activity   Alcohol use: Yes    Alcohol/week: 1.0 standard drink of alcohol    Types: 1 Glasses of wine per week    Comment: 1-2 per day   Drug use:  No   Sexual activity: Yes  Other Topics Concern   Not on file  Social History Narrative   Not on file   Social Drivers of Health   Financial Resource Strain: Not on file  Food Insecurity: Not on file  Transportation Needs: Not on file  Physical Activity: Not on file  Stress: Not on file  Social Connections: Not on file   No Known Allergies Family History  Problem Relation Age of Onset   Heart attack Father    Cancer Mother    Diabetes Sister    Cancer Brother    Healthy Brother      Current Facility-Administered Medications (Endocrine & Metabolic):    methylPREDNISolone  acetate (DEPO-MEDROL ) injection 40 mg  Current Outpatient Medications (Cardiovascular):    atorvastatin (LIPITOR) 80 MG tablet, Take 80 mg by mouth daily.   ezetimibe (ZETIA) 10 MG tablet, Take 10 mg by mouth daily.   lisinopril (PRINIVIL,ZESTRIL) 10 MG tablet, Take 10 mg by mouth daily.   nitroGLYCERIN  (NITROSTAT ) 0.4 MG SL tablet, Place 1 tablet (0.4 mg total) under the tongue every 5 (five) minutes as needed for chest pain.   sildenafil (VIAGRA) 50 MG tablet, Take 50 mg by mouth daily as needed for erectile dysfunction.   Current Outpatient Medications (Respiratory):  fexofenadine (ALLEGRA) 180 MG tablet, Take 180 mg by mouth daily as needed for allergies.    fluticasone (FLONASE) 50 MCG/ACT nasal spray, Place 1 spray into both nostrils daily. PRN   Current Outpatient Medications (Analgesics):    aspirin 81 MG tablet, Take 81 mg by mouth daily.   IBUPROFEN & ACETAMINOPHEN PO, Take by mouth as needed.     Current Outpatient Medications (Other):    Coenzyme Q10 (CO Q-10) 100 MG CAPS, Take 300 mg by mouth daily.   Mag Aspart-Potassium Aspart (POTASSIUM & MAGNESIUM ASPARTAT) 250-250 MG CAPS, Take 1 tablet by mouth daily.   Multiple Vitamin (MULTIVITAMIN) tablet, Take 1 tablet by mouth daily.   Omega-3 Fatty Acids (FISH OIL) 1000 MG CAPS, Take 2 capsules by mouth daily.   OVER THE COUNTER  MEDICATION, Take 1 tablet by mouth daily. Med Name: PROTANDIM    Reviewed prior external information including notes and imaging from  primary care provider As well as notes that were available from care everywhere and other healthcare systems.  Past medical history, social, surgical and family history all reviewed in electronic medical record.  No pertanent information unless stated regarding to the chief complaint.   Review of Systems:  No headache, visual changes, nausea, vomiting, diarrhea, constipation, dizziness, abdominal pain, skin rash, fevers, chills, night sweats, weight loss, swollen lymph nodes, body aches, joint swelling, chest pain, shortness of breath, mood changes. POSITIVE muscle aches  Objective  Blood pressure 122/66, pulse 75, height 5' 10 (1.778 m), weight 181 lb (82.1 kg), SpO2 96%.   General: No apparent distress alert and oriented x3 mood and affect normal, dressed appropriately.  HEENT: Pupils equal, extraocular movements intact  Respiratory: Patient's speak in full sentences and does not appear short of breath  Cardiovascular: No lower extremity edema, non tender, no erythema  Patient's right shoulder does have improvement still in range of motion.  No tenderness noted.  Left knee does have trace effusion noted.  Mild crepitus with patellar grind test noted.  No significant instability of the knee noted.  Limited muscular skeletal ultrasound was performed and interpreted by CLAUDENE HUSSAR, M   Limited ultrasound shows patient has an he does have some hypoechoic changes in the patellofemoral joint.  Patient has no significant narrowing of the medial or lateral joint space. Impression: Patellofemoral arthritis    Impression and Recommendations:    The above documentation has been reviewed and is accurate and complete Raymond Crysler M Raymond Alen, DO

## 2024-07-04 ENCOUNTER — Ambulatory Visit: Admitting: Family Medicine

## 2024-07-04 ENCOUNTER — Other Ambulatory Visit: Payer: Self-pay

## 2024-07-04 ENCOUNTER — Encounter: Payer: Self-pay | Admitting: Family Medicine

## 2024-07-04 VITALS — BP 122/66 | HR 75 | Ht 70.0 in | Wt 181.0 lb

## 2024-07-04 DIAGNOSIS — M25551 Pain in right hip: Secondary | ICD-10-CM | POA: Diagnosis not present

## 2024-07-04 DIAGNOSIS — M19011 Primary osteoarthritis, right shoulder: Secondary | ICD-10-CM

## 2024-07-04 DIAGNOSIS — M1712 Unilateral primary osteoarthritis, left knee: Secondary | ICD-10-CM | POA: Diagnosis not present

## 2024-07-04 NOTE — Assessment & Plan Note (Signed)
 Patient does have patellofemoral arthritis noted.  Trace effusion noted.  Worsening pain consider injection.  VMO exercises given.  Discussed potential bracing.  Patient will be traveling and we will see him again in 6 weeks.

## 2024-07-04 NOTE — Patient Instructions (Signed)
 Knee compression with riding Ice afterwards Kick butt with ping pong See you again [place]

## 2024-07-04 NOTE — Assessment & Plan Note (Signed)
 Seen orthopedics recently and going to continue with conservative therapy.

## 2024-07-04 NOTE — Assessment & Plan Note (Signed)
 Stable at the moment.  No other significant changes necessary

## 2024-07-09 NOTE — Progress Notes (Signed)
 [facility] - Clinic Note  07/10/2024    CHIEF COMPLAINT Patient presents for Retina Follow Up  HISTORY OF PRESENT ILLNESS: Raymond Alexander is a 78 y.o. male who presents to the clinic today for:   HPI     Retina Follow Up   Patient presents with  Wet AMD.  In both eyes.  This started 5 weeks ago.  I, the attending physician,  performed the HPI with the patient and updated documentation appropriately.        Comments   Pt states no changes in vision. Pt occasionally has a FOL in the OD, noticed it last night. Pt states he has small floaters but nothing changing. Pt denies pain. Pt only uses Ats after injections.      Last edited by Valdemar Rogue, MD on 07/10/2024 11:59 PM.     Pt states VA is stable, just seeing a little floater.   Referring physician: Regino Slater, MD [address] [place],  [place] [address]  HISTORICAL INFORMATION:   Selected notes from the MEDICAL RECORD NUMBER Referred by Dr. Glendia for concern of retinal edema OU   CURRENT MEDICATIONS: No current outpatient medications on file. (Ophthalmic Drugs)   No current facility-administered medications for this visit. (Ophthalmic Drugs)   Current Outpatient Medications (Other)  Medication Sig   aspirin 81 MG tablet Take 81 mg by mouth daily.   atorvastatin (LIPITOR) 80 MG tablet Take 80 mg by mouth daily.   Coenzyme Q10 (CO Q-10) 100 MG CAPS Take 300 mg by mouth daily.   ezetimibe (ZETIA) 10 MG tablet Take 10 mg by mouth daily.   fexofenadine (ALLEGRA) 180 MG tablet Take 180 mg by mouth daily as needed for allergies.    fluticasone (FLONASE) 50 MCG/ACT nasal spray Place 1 spray into both nostrils daily. PRN   IBUPROFEN & ACETAMINOPHEN PO Take by mouth as needed.   lisinopril (PRINIVIL,ZESTRIL) 10 MG tablet Take 10 mg by mouth daily.   Mag Aspart-Potassium Aspart (POTASSIUM & MAGNESIUM ASPARTAT) 250-250 MG CAPS Take 1 tablet by mouth daily.   Multiple Vitamin  (MULTIVITAMIN) tablet Take 1 tablet by mouth daily.   nitroGLYCERIN  (NITROSTAT ) 0.4 MG SL tablet Place 1 tablet (0.4 mg total) under the tongue every 5 (five) minutes as needed for chest pain.   Omega-3 Fatty Acids (FISH OIL) 1000 MG CAPS Take 2 capsules by mouth daily.   OVER THE COUNTER MEDICATION Take 1 tablet by mouth daily. Med Name: PROTANDIM   sildenafil (VIAGRA) 50 MG tablet Take 50 mg by mouth daily as needed for erectile dysfunction.   Current Facility-Administered Medications (Other)  Medication Route   methylPREDNISolone  acetate (DEPO-MEDROL ) injection 40 mg Other   REVIEW OF SYSTEMS: ROS   Positive for: Cardiovascular, Eyes Negative for: Constitutional, Gastrointestinal, Neurological, Skin, Genitourinary, Musculoskeletal, HENT, Endocrine, Respiratory, Psychiatric, Allergic/Imm, Heme/Lymph Last edited by Elnor Avelina RAMAN, COT on 07/10/2024 12:31 PM.       ALLERGIES No Known Allergies  PAST MEDICAL HISTORY Past Medical History:  Diagnosis Date   ASCVD (arteriosclerotic cardiovascular disease)    3 vessel   Clotting disorder (HCC)    Esophageal reflux    Hyperlipidemia    Hypertension    Macular degeneration    Past Surgical History:  Procedure Laterality Date   CATARACT EXTRACTION     PTCA  10/14/2003   mid RCA   FAMILY HISTORY Family History  Problem Relation Age of Onset   Heart attack Father  Cancer Mother    Diabetes Sister    Cancer Brother    Healthy Brother    SOCIAL HISTORY Social History   Tobacco Use   Smoking status: Former   Smokeless tobacco: Never  Vaping Use   Vaping status: Never Used  Substance Use Topics   Alcohol use: Yes    Alcohol/week: 1.0 standard drink of alcohol    Types: 1 Glasses of wine per week    Comment: 1-2 per day   Drug use: No       OPHTHALMIC EXAM:  Base Eye Exam     Visual Acuity (Snellen - Linear)       Right Left   Dist cc 20/20 -1 20/20 -1    Correction: Glasses         Tonometry  (Tonopen, 12:35 PM)       Right Left   Pressure 14 10         Pupils       Pupils Dark Light Shape React APD   Right PERRL 3 2 Round Brisk None   Left PERRL 3 2 Round Brisk None         Visual Fields       Left Right    Full Full         Extraocular Movement       Right Left    Full, Ortho Full, Ortho         Neuro/Psych     Oriented x3: Yes   Mood/Affect: Normal         Dilation     Both eyes: 1.0% Mydriacyl, 2.5% Phenylephrine @ 12:35 PM           Slit Lamp and Fundus Exam     Slit Lamp Exam       Right Left   Lids/Lashes Normal small stye nasal LL margin   Conjunctiva/Sclera White and quiet White and quiet   Cornea arcus, well healed cataract wound, trace PEE arcus, well healed cataract wound, trace PEE   Anterior Chamber Deep and quiet Deep and quiet   Iris Round and dilated Round and dilated   Lens PC IOL in good position Toric PC IOL in good position with marks at 0530 and 1100   Anterior Vitreous Mild Vitreous syneresis, no cell or pigment, low lying Posterior vitreous detachment, Weiss ring Mild Vitreous syneresis, no cell or pigment, Posterior vitreous detachment         Fundus Exam       Right Left   Disc Pink and Sharp, mild peripapillary edema, ?peripapillary CNV ST to disc, no heme Pink and Sharp, peripapillary cystic changes -- persistent, CNV ST disc, no heme   C/D Ratio 0.5 0.4   Macula Good foveal reflex, nasal cystic changes/edema -- persistent, no heme Good foveal reflex, mild edema nasal macula -- persistent, no heme   Vessels attenuated, Tortuous attenuated, Tortuous   Periphery Attached, No heme, no RT/RD Attached, No heme            Refraction     Wearing Rx       Sphere Cylinder Axis Add   Right [place] +0.25 060 +2.25   Left +0.25 Sphere  +2.25    Type: Progressives           IMAGING AND PROCEDURES  Imaging and Procedures for 07/10/2024  OCT, Retina - OU - Both Eyes       Right Eye Quality was  good. Central Foveal Thickness:  287. Progression has worsened. Findings include normal foveal contour, no SRF, retinal drusen , intraretinal fluid, pigment epithelial detachment (Persistent peripapillary IRF/cystic changes -- slightly increased, stable improvement in focal SRF superior to disc, no fluid centrally).   Left Eye Quality was good. Central Foveal Thickness: 270. Progression has worsened. Findings include normal foveal contour, no SRF, retinal drusen , intraretinal fluid (Persistent peripapillary IRF nasal macula, SRF stably improved, no fluid centrally).   Notes *Images captured and stored on drive  Diagnosis / Impression:  OD: Persistent peripapillary IRF/cystic changes -- slightly increased, stable improvement in focal SRF superior to disc, no fluid centrally OS: Persistent peripapillary IRF nasal macula -- slightly increased, SRF stably improved, no fluid centrally  Clinical management:  See below  Abbreviations: NFP - Normal foveal profile. CME - cystoid macular edema. PED - pigment epithelial detachment. IRF - intraretinal fluid. SRF - subretinal fluid. EZ - ellipsoid zone. ERM - epiretinal membrane. ORA - outer retinal atrophy. ORT - outer retinal tubulation. SRHM - subretinal hyper-reflective material. IRHM - intraretinal hyper-reflective material      Intravitreal Injection, Pharmacologic Agent - OD - Right Eye       Time Out 07/10/2024. 1:15 PM. Confirmed correct patient, procedure, site, and patient consented.   Anesthesia Topical anesthesia was used. Anesthetic medications included Lidocaine 2%, Proparacaine 0.5%.   Procedure Preparation included 5% betadine to ocular surface, eyelid speculum. A (32g) needle was used.   Injection: 6 mg faricimab -svoa 6 MG/0.05ML (Patient supplied)   Route: Intravitreal, Site: Right Eye   NDC: 49757-903-98, Lot: [id number], Expiration date: 09/11/2025, Waste: 0 mL   Post-op Post injection exam found visual acuity of at least  counting fingers. The patient tolerated the procedure well. There were no complications. The patient received written and verbal post procedure care education. Post injection medications were not given.   Notes **SAMPLE MEDICATION ADMINISTERED**     Intravitreal Injection, Pharmacologic Agent - OS - Left Eye       Time Out 07/10/2024. 1:16 PM. Confirmed correct patient, procedure, site, and patient consented.   Anesthesia Topical anesthesia was used. Anesthetic medications included Lidocaine 2%, Proparacaine 0.5%.   Procedure Preparation included 5% betadine to ocular surface, eyelid speculum. A supplied (32g) needle was used.   Injection: 1.25 mg Bevacizumab  1.25mg /0.72ml   Route: Intravitreal, Site: Left Eye   NDC: 49757-939-98, Lot: [id number]$ , Expiration date: 08/05/2024   Post-op Post injection exam found visual acuity of at least counting fingers. The patient tolerated the procedure well. There were no complications. The patient received written and verbal post procedure care education. Post injection medications were not given.            ASSESSMENT/PLAN:    ICD-10-CM   1. Exudative age-related macular degeneration of both eyes with active choroidal neovascularization (HCC)  H35.3231 OCT, Retina - OU - Both Eyes    Intravitreal Injection, Pharmacologic Agent - OD - Right Eye    Intravitreal Injection, Pharmacologic Agent - OS - Left Eye    Bevacizumab  (AVASTIN ) SOLN 1.25 mg    faricimab -svoa (VABYSMO ) 6mg /0.63mL intravitreal injection    2. Essential hypertension  I10     3. Hypertensive retinopathy of both eyes  H35.033     4. Pseudophakia, both eyes  Z96.1     5. Retinal edema  H35.81       1. Peripapillary CNV / exudative ARMD OU - 8.1.22 pt with 1 wk history of decreased vision OS and isolated light green flashes OD -- flashes OD  resolved, blurriness OS improving  - 10.04.22 small recurrent episode of flashes OD last week.   - 11.15.22 subjectively  blurred vision OS - repeat FA (08.23.22) shows mild peripapillary staining and ?leakage OU -- ?CNV  - unclear etiology, but suspect ARMD vs idiopathic CNV - s/p IVA OS #1 (11.15.22), #2 (12.13.22), #3 (01.10.23), #4 (02.09.23), #5 (01.29.25), #6 (02.26.25), #7 (04.07.25), #8 (05.13.25), #9 (06.17.25) - s/p IVA OD #1 (01.29.25), #2 (02.26.25) ==================================== - s/p IVE OS #1 (04.18.23), #2 (05.16.23), #3 (06.13.23), #4 (07.17.23), #5 (08.15.23), #6 (09.13.23), #7 (10.18.23), #8 (11.27.23), #9 (01.04.24), #10 (02.15.24), #11 (03.21.24), #12 (04.25.24), #13 (05.23.24), #14 (07.08.24), #15 (08.15.24), #16 (09.25.24), #17 (11.06.24), #18 (11.06.24) - s/p IVE OD #1 (07.17.23), #2 (08.15.23), #3 (09.13.23), #4 (10.18.23), #5 (11.27.23), #6 (01.04.24), #7 (02.15.24), #8 (03.21.24), #9 (04.25.24), #10 (05.23.24), #11 (07.08.24), #12 (08.15.24), #13 (09.25.24), #14 (11.06.24), #15 (11.06.24) ================================ - s/p IVV OD #1 (04.07.25, sample), #2 (05.13.25, sample), #3 (06.17.25, sample) =========================== - BCVA stable at 20/20 OU - OCT shows OD: Persistent peripapillary IRF/cystic changes -- slightly increased, stable improvement in focal SRF superior to disc, no fluid centrally; OS: Persistent peripapillary IRF nasal macula, SRF stably improved, no fluid centrally -- slightly increased at 6 wks - recommend IVA OS #10 and IVV OD #4 (sample) today, 07.29.25 with follow up in 6 wks - pt wishes to proceed with injections  - RBA of procedure discussed, questions answered - IVA informed consent obtained and signed, 01.29.25 (OU) - IVV informed consent obtained and signed, 04.09.25 (OD) - see procedure note   - cont Amsler grid monitoring  - Eylea  approved for 2025, but Good Days funding unavailable  - f/u ext to 6 weeks, DFE, OCT  2,3. Hypertensive retinopathy OU - discussed importance of tight BP control - continue to monitor  4. Pseudophakia OU  - s/p  CE/IOL (Dr. Lavonia, 2022)  - IOLs in good position, doing well - continue to monitor  Ophthalmic Meds Ordered this visit:  Meds ordered this encounter  Medications   Bevacizumab  (AVASTIN ) SOLN 1.25 mg   faricimab -svoa (VABYSMO ) 6mg /0.46mL intravitreal injection     Return in about 6 weeks (around 08/21/2024) for f/u exu ARMD OU, DFE, OCT, Possible Injxn.  There are no Patient Instructions on file for this visit.  This document serves as a record of services personally performed by Redell JUDITHANN Hans, MD, PhD. It was created on their behalf by Auston Muzzy, COMT. The creation of this record is the provider's dictation and/or activities during the visit.  Electronically signed by: Auston Muzzy, COMT 07/15/24 1:27 AM  This document serves as a record of services personally performed by Redell JUDITHANN Hans, MD, PhD. It was created on their behalf by Alan PARAS. Delores, OA an ophthalmic technician. The creation of this record is the provider's dictation and/or activities during the visit.    Electronically signed by: Alan PARAS. Delores, OA 07/15/24 1:27 AM  Redell JUDITHANN Hans, M.D., Ph.D. Diseases & Surgery of the Retina and Vitreous [facility]  I have reviewed the above documentation for accuracy and completeness, and I agree with the above. Redell JUDITHANN Hans, M.D., Ph.D. 07/15/24 1:38 AM   Abbreviations: M myopia (nearsighted); A astigmatism; H hyperopia (farsighted); P presbyopia; Mrx spectacle prescription;  CTL contact lenses; OD right eye; OS left eye; OU both eyes  XT exo meibomian gland dysfunction; ATs artificial tears; PFAT's preservative free artificial tears; NSC nuclear sclerotic cataract; PSC posterior subcapsular cataract; ERM epi-retinal  membrane; PVD posterior vitreous detachment; RD retinal detachment; DM diabetes mellitus; DR diabetic retinopathy; NPDR non-proliferative diabetic retinopathy; PDR proliferative diabetic retinopathy; CSME clinically significant macular  edema; DME diabetic macular edema; dbh dot blot hemorrhages; CWS cotton wool spot; POAG primary open angle glaucoma; C/D cup-to-disc ratio; HVF humphrey visual field; GVF goldmann visual field; OCT optical coherence tomography; IOP intraocular pressure; BRVO Branch retinal vein occlusion; CRVO central retinal vein occlusion; CRAO central retinal artery occlusion; BRAO branch retinal artery occlusion; RT retinal tear; SB scleral buckle; PPV pars plana vitrectomy; VH Vitreous hemorrhage; PRP panretinal laser photocoagulation; IVK intravitreal kenalog; VMT vitreomacular traction; MH Macular hole;  NVD neovascularization of the disc; NVE neovascularization elsewhere; AREDS age related eye disease study; ARMD age related macular degeneration; POAG primary open angle glaucoma; EBMD epithelial/anterior basement membrane dystrophy; ACIOL anterior chamber intraocular lens; IOL intraocular lens; PCIOL posterior chamber intraocular lens; Phaco/IOL phacoemulsification with intraocular lens placement; PRK photorefractive keratectomy; LASIK laser assisted in situ keratomileusis; HTN hypertension; DM diabetes mellitus; COPD chronic obstructive pulmonary disease

## 2024-07-10 ENCOUNTER — Ambulatory Visit (INDEPENDENT_AMBULATORY_CARE_PROVIDER_SITE_OTHER): Admitting: Ophthalmology

## 2024-07-10 ENCOUNTER — Encounter (INDEPENDENT_AMBULATORY_CARE_PROVIDER_SITE_OTHER): Payer: Self-pay | Admitting: Ophthalmology

## 2024-07-10 DIAGNOSIS — H35033 Hypertensive retinopathy, bilateral: Secondary | ICD-10-CM

## 2024-07-10 DIAGNOSIS — Z961 Presence of intraocular lens: Secondary | ICD-10-CM | POA: Diagnosis not present

## 2024-07-10 DIAGNOSIS — I10 Essential (primary) hypertension: Secondary | ICD-10-CM | POA: Diagnosis not present

## 2024-07-10 DIAGNOSIS — H353231 Exudative age-related macular degeneration, bilateral, with active choroidal neovascularization: Secondary | ICD-10-CM | POA: Diagnosis not present

## 2024-07-10 DIAGNOSIS — H3581 Retinal edema: Secondary | ICD-10-CM

## 2024-07-11 MED ORDER — FARICIMAB-SVOA 6 MG/0.05ML IZ SOLN
6.0000 mg | INTRAVITREAL | Status: AC | PRN
  Administered 2024-07-11: 6 mg via INTRAVITREAL

## 2024-07-11 MED ORDER — BEVACIZUMAB CHEMO INJECTION 1.25MG/0.05ML SYRINGE FOR KALEIDOSCOPE
1.2500 mg | INTRAVITREAL | Status: AC | PRN
  Administered 2024-07-11: 1.25 mg via INTRAVITREAL

## 2024-07-24 DIAGNOSIS — L03116 Cellulitis of left lower limb: Secondary | ICD-10-CM | POA: Diagnosis not present

## 2024-07-26 NOTE — Progress Notes (Signed)
 Cardiology Office Note:  .   Date:  07/27/2024  ID:  Alm Raymond Alexander, DOB Oct 30, 1946, MRN [id number] PCP: Regino Slater, MD  [facility] Providers Cardiologist:  Candyce Reek, MD { History of Present Illness: .    Chief Complaint  Patient presents with   Follow-up    Raymond Alexander is a 78 y.o. male with history of CAD, HTN, HLD who presents for follow-up.   History of Present Illness   Raymond Alexander is a 78 year old male with coronary artery disease who presents for a yearly follow-up.  He has a history of coronary artery disease and underwent percutaneous coronary intervention (PCI) to the mid right coronary artery (RCA) in 2004, with residual obtuse marginal (OM) disease. A nuclear stress test in 2017 was negative, and he was last on a treadmill in 2021. No history of myocardial infarction. No current symptoms of angina, chest discomfort, or dyspnea. He maintains a high level of physical activity, including swimming about 800 meters and biking, without any symptoms.  His current medications include Lipitor 80 mg daily, Zetia 10 mg daily, lisinopril 10 mg daily, and aspirin 81 mg daily. He mentions a past discussion about possibly discontinuing Zetia, but it was continued.  He has a family history of heart disease, as his father had it. He advises his sons to take preventive measures. He denies smoking and reports minimal alcohol consumption. 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  During the review of symptoms, no dizziness, lightheadedness, or low energy. He notes that his heart rate has always been low, especially when he is fit, and recalls a past medical check where a heart murmur was suspected.           Problem List CAD -PCI mRCA 2004 -occlude septal perforator  -85% OM 2. HLD -T chol 126, HDL 45, LDL 59, TG 126 3. HTN 4. Sinus bradycardia     ROS: All other ROS reviewfed and negative. Pertinent positives noted in the  HPI.     Studies Reviewed: SABRA   EKG Interpretation Date/Time:  Friday July 27 2024 08:36:58 EDT Ventricular Rate:  41 PR Interval:  162 QRS Duration:  86 QT Interval:  454 QTC Calculation: 374 R Axis:   73  Text Interpretation: Marked sinus bradycardia Septal infarct , age undetermined Confirmed by Barbaraann Kotyk [id number]) on 07/27/2024 8:43:40 AM   NM Stress 07/05/2016 Normal resting and stress perfusion. No ischemia or infarction EF 55% Test low risk but nondiagnostic as patient only achieved 89% of Predicted PMHR Physical Exam:   VS:  BP 125/65   Pulse (!) 41   Ht 5' 10 (1.778 m)   Wt 181 lb (82.1 kg)   SpO2 96%   BMI 25.97 kg/m    Wt Readings from Last 3 Encounters:  07/27/24 181 lb (82.1 kg)  07/04/24 181 lb (82.1 kg)  03/20/24 186 lb (84.4 kg)    GEN: Well nourished, well developed in no acute distress NECK: No JVD; No carotid bruits CARDIAC: RRR, no murmurs, rubs, gallops RESPIRATORY:  Clear to auscultation without rales, wheezing or rhonchi  ABDOMEN: Soft, non-tender, non-distended EXTREMITIES:  No edema; No deformity  ASSESSMENT AND PLAN: .   Assessment and Plan    Coronary artery disease status post percutaneous coronary intervention Coronary artery disease with prior PCI to mid RCA in 2004. Asymptomatic, high physical activity level. Managed with aspirin and statin. No routine stress testing needed unless symptomatic. -  Continue aspirin 81 mg daily. - Continue Lipitor 80 mg daily. - Annual follow-up unless symptoms develop.  Hyperlipidemia Hyperlipidemia well-managed. LDL at goal with current therapy. Continued statin and Zetia use recommended. - Continue Zetia 10 mg daily. - Continue Lipitor 80 mg daily.  Hypertension Hypertension well-controlled with lisinopril. - Continue lisinopril 10 mg daily.  Sinus bradycardia Asymptomatic sinus bradycardia with HR 41 bpm, likely due to high fitness level. No symptoms reported. - Monitor for dizziness,  lightheadedness, or low energy.              Follow-up: Return in about 1 year (around 07/27/2025).   Signed, Darryle DASEN. Barbaraann, MD, [facility]  [facility]  [address] [place], [place] [address] [phone]  9:27 AM

## 2024-07-27 ENCOUNTER — Encounter: Payer: Self-pay | Admitting: Cardiovascular Disease

## 2024-07-27 ENCOUNTER — Ambulatory Visit: Attending: Cardiovascular Disease | Admitting: Cardiovascular Disease

## 2024-07-27 VITALS — BP 125/65 | HR 41 | Ht 70.0 in | Wt 181.0 lb

## 2024-07-27 DIAGNOSIS — I2510 Atherosclerotic heart disease of native coronary artery without angina pectoris: Secondary | ICD-10-CM | POA: Diagnosis not present

## 2024-07-27 DIAGNOSIS — E782 Mixed hyperlipidemia: Secondary | ICD-10-CM

## 2024-07-27 DIAGNOSIS — I150 Renovascular hypertension: Secondary | ICD-10-CM

## 2024-07-27 NOTE — Patient Instructions (Signed)
 Medication Instructions:  Your physician recommends that you continue on your current medications as directed. Please refer to the Current Medication list given to you today.  *If you need a refill on your cardiac medications before your next appointment, please call your pharmacy*  Lab Work: None ordered  If you have labs (blood work) drawn today and your tests are completely normal, you will receive your results only by: MyChart Message (if you have MyChart) OR A paper copy in the mail If you have any lab test that is abnormal or we need to change your treatment, we will call you to review the results.  Testing/Procedures: None ordered  Follow-Up: At [facility], you and your health needs are our priority.  As part of our continuing mission to provide you with exceptional heart care, our providers are all part of one team.  This team includes your primary Cardiologist (physician) and Advanced Practice Providers or APPs (Physician Assistants and Nurse Practitioners) who all work together to provide you with the care you need, when you need it.  Your next appointment:   1 year(s)  Provider:   Callie Goodrich, PA-C or Hao Meng, PA-C          We recommend signing up for the patient portal called MyChart.  Sign up information is provided on this After Visit Summary.  MyChart is used to connect with patients for Virtual Visits (Telemedicine).  Patients are able to view lab/test results, encounter notes, upcoming appointments, etc.  Non-urgent messages can be sent to your provider as well.   To learn more about what you can do with MyChart, go to [email or web address].   Other Instructions

## 2024-08-09 NOTE — Progress Notes (Signed)
 [facility] - Clinic Note  08/21/2024    CHIEF COMPLAINT Patient presents for Retina Follow Up  HISTORY OF PRESENT ILLNESS: Raymond Alexander is a 78 y.o. male who presents to the clinic today for:   HPI     Retina Follow Up   Patient presents with  Wet AMD.  In both eyes.  This started 3 years ago.  Severity is moderate.  Duration of 5 weeks.  Since onset it is stable.  I, the attending physician,  performed the HPI with the patient and updated documentation appropriately.        Comments   Pt denies any changes in vision/FOL/pain. Pt has some floaters but nothing new.       Last edited by Valdemar Rogue, MD on 08/23/2024  4:11 PM.    Pt states   Referring physician: Regino Slater, MD [address] [place],  [place] [address]  HISTORICAL INFORMATION:   Selected notes from the MEDICAL RECORD NUMBER Referred by Dr. Glendia for concern of retinal edema OU   CURRENT MEDICATIONS: No current outpatient medications on file. (Ophthalmic Drugs)   No current facility-administered medications for this visit. (Ophthalmic Drugs)   Current Outpatient Medications (Other)  Medication Sig   aspirin 81 MG tablet Take 81 mg by mouth daily.   atorvastatin (LIPITOR) 80 MG tablet Take 80 mg by mouth daily.   Coenzyme Q10 (CO Q-10) 100 MG CAPS Take 300 mg by mouth daily.   ezetimibe (ZETIA) 10 MG tablet Take 10 mg by mouth daily.   fexofenadine (ALLEGRA) 180 MG tablet Take 180 mg by mouth daily as needed for allergies.    fluticasone (FLONASE) 50 MCG/ACT nasal spray Place 1 spray into both nostrils daily. PRN   IBUPROFEN & ACETAMINOPHEN PO Take by mouth as needed.   lisinopril (PRINIVIL,ZESTRIL) 10 MG tablet Take 10 mg by mouth daily.   Mag Aspart-Potassium Aspart (POTASSIUM & MAGNESIUM ASPARTAT) 250-250 MG CAPS Take 1 tablet by mouth daily.   Multiple Vitamin (MULTIVITAMIN) tablet Take 1 tablet by mouth daily.   nitroGLYCERIN  (NITROSTAT ) 0.4 MG SL tablet  Place 1 tablet (0.4 mg total) under the tongue every 5 (five) minutes as needed for chest pain.   Omega-3 Fatty Acids (FISH OIL) 1000 MG CAPS Take 2 capsules by mouth daily.   OVER THE COUNTER MEDICATION Take 1 tablet by mouth daily. Med Name: PROTANDIM   sildenafil (VIAGRA) 50 MG tablet Take 50 mg by mouth daily as needed for erectile dysfunction.   Current Facility-Administered Medications (Other)  Medication Route   methylPREDNISolone  acetate (DEPO-MEDROL ) injection 40 mg Other   REVIEW OF SYSTEMS: ROS   Positive for: Cardiovascular, Eyes Negative for: Constitutional, Gastrointestinal, Neurological, Skin, Genitourinary, Musculoskeletal, HENT, Endocrine, Respiratory, Psychiatric, Allergic/Imm, Heme/Lymph Last edited by Elnor Avelina RAMAN, COT on 08/21/2024 12:44 PM.        ALLERGIES No Known Allergies  PAST MEDICAL HISTORY Past Medical History:  Diagnosis Date   ASCVD (arteriosclerotic cardiovascular disease)    3 vessel   Clotting disorder (HCC)    Esophageal reflux    Hyperlipidemia    Hypertension    Macular degeneration    Past Surgical History:  Procedure Laterality Date   CATARACT EXTRACTION     PTCA  10/14/2003   mid RCA   FAMILY HISTORY Family History  Problem Relation Age of Onset   Heart attack Father    Cancer Mother    Diabetes Sister    Cancer  Brother    Healthy Brother    SOCIAL HISTORY Social History   Tobacco Use   Smoking status: Former   Smokeless tobacco: Never  Advertising account planner   Vaping status: Never Used  Substance Use Topics   Alcohol use: Yes    Alcohol/week: 1.0 standard drink of alcohol    Types: 1 Glasses of wine per week    Comment: 1-2 per day   Drug use: No       OPHTHALMIC EXAM:  Base Eye Exam     Visual Acuity (Snellen - Linear)       Right Left   Dist cc 20/20 20/20 -1    Correction: Glasses         Tonometry (Tonopen, 12:50 PM)       Right Left   Pressure 13 12         Pupils       Pupils Dark Light  Shape React APD   Right PERRL 3 2 Round Brisk None   Left PERRL 3 2 Round Brisk None         Visual Fields       Left Right    Full Full         Extraocular Movement       Right Left    Full, Ortho Full, Ortho         Neuro/Psych     Oriented x3: Yes   Mood/Affect: Normal         Dilation     Both eyes: 1.0% Mydriacyl, 2.5% Phenylephrine @ 12:50 PM           Slit Lamp and Fundus Exam     Slit Lamp Exam       Right Left   Lids/Lashes Normal small stye nasal LL margin   Conjunctiva/Sclera White and quiet White and quiet   Cornea arcus, well healed cataract wound, trace PEE arcus, well healed cataract wound, trace PEE   Anterior Chamber Deep and quiet Deep and quiet   Iris Round and dilated Round and dilated   Lens PC IOL in good position Toric PC IOL in good position with marks at 0530 and 1100   Anterior Vitreous Mild Vitreous syneresis, no cell or pigment, low lying Posterior vitreous detachment, Weiss ring Mild Vitreous syneresis, no cell or pigment, Posterior vitreous detachment         Fundus Exam       Right Left   Disc Pink and Sharp, mild peripapillary edema, ?peripapillary CNV ST to disc, no heme Pink and Sharp, peripapillary cystic changes -- persistent, CNV ST disc, no heme   C/D Ratio 0.5 0.4   Macula Good foveal reflex, nasal cystic changes/edema -- persistent, no heme Good foveal reflex, mild edema nasal macula -- persistent, no heme   Vessels attenuated, Tortuous attenuated, Tortuous   Periphery Attached, No heme, no RT/RD Attached, No heme            Refraction     Wearing Rx       Sphere Cylinder Axis Add   Right [place] +0.25 060 +2.25   Left +0.25 Sphere  +2.25    Type: Progressives           IMAGING AND PROCEDURES  Imaging and Procedures for 08/21/2024  OCT, Retina - OU - Both Eyes       Right Eye Quality was good. Central Foveal Thickness: 292. Progression has been stable. Findings include normal foveal  contour, no SRF,  retinal drusen , intraretinal fluid, pigment epithelial detachment (Persistent peripapillary IRF/cystic changes, stable improvement in focal SRF superior to disc, no fluid centrally).   Left Eye Quality was good. Central Foveal Thickness: 275. Progression has been stable. Findings include normal foveal contour, retinal drusen , intraretinal fluid, subretinal fluid (Persistent peripapillary IRF nasal macula--slightly improved, SRF slightly increased, no fluid centrally).   Notes *Images captured and stored on drive  Diagnosis / Impression:  OD: Persistent peripapillary IRF/cystic changes, stable improvement in focal SRF superior to disc, no fluid centrally OS: Persistent peripapillary IRF nasal macula--slightly improved, SRF slightly increased, no fluid centrally  Clinical management:  See below  Abbreviations: NFP - Normal foveal profile. CME - cystoid macular edema. PED - pigment epithelial detachment. IRF - intraretinal fluid. SRF - subretinal fluid. EZ - ellipsoid zone. ERM - epiretinal membrane. ORA - outer retinal atrophy. ORT - outer retinal tubulation. SRHM - subretinal hyper-reflective material. IRHM - intraretinal hyper-reflective material      Intravitreal Injection, Pharmacologic Agent - OD - Right Eye       Time Out 08/21/2024. 1:30 PM. Confirmed correct patient, procedure, site, and patient consented.   Anesthesia Topical anesthesia was used. Anesthetic medications included Lidocaine 2%, Proparacaine 0.5%.   Procedure Preparation included 5% betadine to ocular surface, eyelid speculum. A (32g) needle was used.   Injection: 6 mg faricimab -svoa 6 MG/0.05ML (Patient supplied)   Route: Intravitreal, Site: Right Eye   NDC: 49757-903-98, Lot: [id number], Expiration date: 09/11/2026, Waste: 0 mL   Post-op Post injection exam found visual acuity of at least counting fingers. The patient tolerated the procedure well. There were no complications. The patient  received written and verbal post procedure care education. Post injection medications were not given.   Notes **SAMPLE MEDICATION ADMINISTERED**     Intravitreal Injection, Pharmacologic Agent - OS - Left Eye       Time Out 08/21/2024. 1:30 PM. Confirmed correct patient, procedure, site, and patient consented.   Anesthesia Topical anesthesia was used. Anesthetic medications included Lidocaine 2%, Proparacaine 0.5%.   Procedure Preparation included 5% betadine to ocular surface, eyelid speculum. A supplied (32g) needle was used.   Injection: 1.25 mg Bevacizumab  1.25mg /0.21ml   Route: Intravitreal, Site: Left Eye   NDC: C2662926, Lot: [id number], Expiration date: 09/02/2024   Post-op Post injection exam found visual acuity of at least counting fingers. The patient tolerated the procedure well. There were no complications. The patient received written and verbal post procedure care education. Post injection medications were not given.            ASSESSMENT/PLAN:    ICD-10-CM   1. Exudative age-related macular degeneration of both eyes with active choroidal neovascularization (HCC)  H35.3231 OCT, Retina - OU - Both Eyes    Intravitreal Injection, Pharmacologic Agent - OD - Right Eye    Intravitreal Injection, Pharmacologic Agent - OS - Left Eye    Bevacizumab  (AVASTIN ) SOLN 1.25 mg    faricimab -svoa (VABYSMO ) 6mg /0.73mL intravitreal injection    2. Essential hypertension  I10     3. Hypertensive retinopathy of both eyes  H35.033     4. Pseudophakia, both eyes  Z96.1      1. Peripapillary CNV / exudative ARMD OU - 8.01.22 pt with 1 wk history of decreased vision OS and isolated light green flashes OD -- flashes OD resolved, blurriness OS improving  - 10.04.22 small recurrent episode of flashes OD last week.   - 11.15.22 subjectively blurred vision OS - repeat FA (08.23.22)  shows mild peripapillary staining and ?leakage OU -- ?CNV  - unclear etiology, but suspect ARMD vs  idiopathic CNV - s/p IVA OS #1 (11.15.22), #2 (12.13.22), #3 (01.10.23), #4 (02.09.23), #5 (01.29.25), #6 (02.26.25), #7 (04.07.25), #8 (05.13.25), #9 (06.17.25), #10 (07.29.25) - s/p IVA OD #1 (01.29.25), #2 (02.26.25) ==================================== - s/p IVE OS #1 (04.18.23), #2 (05.16.23), #3 (06.13.23), #4 (07.17.23), #5 (08.15.23), #6 (09.13.23), #7 (10.18.23), #8 (11.27.23), #9 (01.04.24), #10 (02.15.24), #11 (03.21.24), #12 (04.25.24), #13 (05.23.24), #14 (07.08.24), #15 (08.15.24), #16 (09.25.24), #17 (11.06.24), #18 (11.06.24) - s/p IVE OD #1 (07.17.23), #2 (08.15.23), #3 (09.13.23), #4 (10.18.23), #5 (11.27.23), #6 (01.04.24), #7 (02.15.24), #8 (03.21.24), #9 (04.25.24), #10 (05.23.24), #11 (07.08.24), #12 (08.15.24), #13 (09.25.24), #14 (11.06.24), #15 (11.06.24) ================================ - s/p IVV OD #1 (04.07.25, sample), #2 (05.13.25, sample), #3 (06.17.25, sample), #4 (sample 07.29.25) =========================== - BCVA stable at 20/20 OU - OCT shows OD: Persistent peripapillary IRF/cystic changes, stable improvement in focal SRF superior to disc, no fluid centrally; OS: Persistent peripapillary IRF nasal macula--slightly improved, SRF slightly increased, no fluid centrally at 6 wks - recommend IVA OS #10 and IVV OD #4 (sample) today, 09.09.25 with follow up in 6 wks - pt wishes to proceed with injections  - RBA of procedure discussed, questions answered - IVA informed consent obtained and signed, 01.29.25 (OU) - IVV informed consent obtained and signed, 04.09.25 (OD) - see procedure note   - cont Amsler grid monitoring  - Eylea  approved for 2025, but Good Days funding unavailable  - f/u ext to 6 weeks, DFE, OCT  2,3. Hypertensive retinopathy OU - discussed importance of tight BP control - continue to monitor  4. Pseudophakia OU  - s/p CE/IOL (Dr. Lavonia, 2022)  - IOLs in good position, doing well - continue to monitor  Ophthalmic Meds Ordered this visit:   Meds ordered this encounter  Medications   Bevacizumab  (AVASTIN ) SOLN 1.25 mg   faricimab -svoa (VABYSMO ) 6mg /0.35mL intravitreal injection     Return in about 6 weeks (around 10/02/2024) for exu ARMD OU, DFE, OCT.  There are no Patient Instructions on file for this visit.  This document serves as a record of services personally performed by Redell JUDITHANN Hans, MD, PhD. It was created on their behalf by Wanda GEANNIE Keens, COT an ophthalmic technician. The creation of this record is the provider's dictation and/or activities during the visit.    Electronically signed by:  Wanda GEANNIE Keens, COT  08/23/24 4:11 PM  This document serves as a record of services personally performed by Redell JUDITHANN Hans, MD, PhD. It was created on their behalf by Almetta Pesa, an ophthalmic technician. The creation of this record is the provider's dictation and/or activities during the visit.    Electronically signed by: Almetta Pesa, OA, 08/23/24  4:11 PM  Redell JUDITHANN Hans, M.D., Ph.D. Diseases & Surgery of the Retina and Vitreous [facility]  I have reviewed the above documentation for accuracy and completeness, and I agree with the above. Redell JUDITHANN Hans, M.D., Ph.D. 08/23/24 4:11 PM   Abbreviations: M myopia (nearsighted); A astigmatism; H hyperopia (farsighted); P presbyopia; Mrx spectacle prescription;  CTL contact lenses; OD right eye; OS left eye; OU both eyes  XT exo meibomian gland dysfunction; ATs artificial tears; PFAT's preservative free artificial tears; NSC nuclear sclerotic cataract; PSC posterior subcapsular cataract; ERM epi-retinal membrane; PVD posterior vitreous detachment; RD retinal detachment; DM diabetes mellitus; DR diabetic retinopathy; NPDR non-proliferative diabetic retinopathy; PDR proliferative diabetic retinopathy; CSME  clinically significant macular edema; DME diabetic macular edema; dbh dot blot hemorrhages; CWS cotton wool spot; POAG primary open  angle glaucoma; C/D cup-to-disc ratio; HVF humphrey visual field; GVF goldmann visual field; OCT optical coherence tomography; IOP intraocular pressure; BRVO Branch retinal vein occlusion; CRVO central retinal vein occlusion; CRAO central retinal artery occlusion; BRAO branch retinal artery occlusion; RT retinal tear; SB scleral buckle; PPV pars plana vitrectomy; VH Vitreous hemorrhage; PRP panretinal laser photocoagulation; IVK intravitreal kenalog; VMT vitreomacular traction; MH Macular hole;  NVD neovascularization of the disc; NVE neovascularization elsewhere; AREDS age related eye disease study; ARMD age related macular degeneration; POAG primary open angle glaucoma; EBMD epithelial/anterior basement membrane dystrophy; ACIOL anterior chamber intraocular lens; IOL intraocular lens; PCIOL posterior chamber intraocular lens; Phaco/IOL phacoemulsification with intraocular lens placement; PRK photorefractive keratectomy; LASIK laser assisted in situ keratomileusis; HTN hypertension; DM diabetes mellitus; COPD chronic obstructive pulmonary disease

## 2024-08-21 ENCOUNTER — Encounter (INDEPENDENT_AMBULATORY_CARE_PROVIDER_SITE_OTHER): Payer: Self-pay | Admitting: Ophthalmology

## 2024-08-21 ENCOUNTER — Ambulatory Visit (INDEPENDENT_AMBULATORY_CARE_PROVIDER_SITE_OTHER): Admitting: Ophthalmology

## 2024-08-21 DIAGNOSIS — Z961 Presence of intraocular lens: Secondary | ICD-10-CM | POA: Diagnosis not present

## 2024-08-21 DIAGNOSIS — H353231 Exudative age-related macular degeneration, bilateral, with active choroidal neovascularization: Secondary | ICD-10-CM

## 2024-08-21 DIAGNOSIS — I10 Essential (primary) hypertension: Secondary | ICD-10-CM | POA: Diagnosis not present

## 2024-08-21 DIAGNOSIS — H35033 Hypertensive retinopathy, bilateral: Secondary | ICD-10-CM | POA: Diagnosis not present

## 2024-08-23 ENCOUNTER — Encounter (INDEPENDENT_AMBULATORY_CARE_PROVIDER_SITE_OTHER): Payer: Self-pay | Admitting: Ophthalmology

## 2024-08-23 MED ORDER — BEVACIZUMAB CHEMO INJECTION 1.25MG/0.05ML SYRINGE FOR KALEIDOSCOPE
1.2500 mg | INTRAVITREAL | Status: AC | PRN
  Administered 2024-08-23: 1.25 mg via INTRAVITREAL

## 2024-08-23 MED ORDER — FARICIMAB-SVOA 6 MG/0.05ML IZ SOLN
6.0000 mg | INTRAVITREAL | Status: AC | PRN
  Administered 2024-08-23: 6 mg via INTRAVITREAL

## 2024-08-28 NOTE — Progress Notes (Deleted)
 [facility] [address] [place] [address] Phone: [phone] Subjective:    I'm seeing this patient by the request  of:  Koirala, Dibas, MD  CC: Knee pain follow-up, hip pain follow-up  YEP:Dlagzrupcz  07/04/2024 Patient does have patellofemoral arthritis noted.  Trace effusion noted.  Worsening pain consider injection.  VMO exercises given.  Discussed potential bracing.  Patient will be traveling and we will see him again in 6 weeks.   Seen orthopedics recently and going to continue with conservative therapy.     Stable at the moment.  No other significant changes necessary     Update 08/29/2024 Raymond Alexander is a 78 y.o. male coming in with complaint of L knee and R hip pain. Patient states       Past Medical History:  Diagnosis Date   ASCVD (arteriosclerotic cardiovascular disease)    3 vessel   Clotting disorder (HCC)    Esophageal reflux    Hyperlipidemia    Hypertension    Macular degeneration    Past Surgical History:  Procedure Laterality Date   CATARACT EXTRACTION     PTCA  10/14/2003   mid RCA   Social History   Socioeconomic History   Marital status: Married    Spouse name: Not on file   Number of children: 3   Years of education: Not on file   Highest education level: Not on file  Occupational History   Not on file  Tobacco Use   Smoking status: Former   Smokeless tobacco: Never  Vaping Use   Vaping status: Never Used  Substance and Sexual Activity   Alcohol use: Yes    Alcohol/week: 1.0 standard drink of alcohol    Types: 1 Glasses of wine per week    Comment: 1-2 per day   Drug use: No   Sexual activity: Yes  Other Topics Concern   Not on file  Social History Narrative   Not on file   Social Drivers of Health   Financial Resource Strain: Not on file  Food Insecurity: Not on file  Transportation Needs: Not on file  Physical Activity: Not on file  Stress: Not on file  Social Connections: Not  on file   No Known Allergies Family History  Problem Relation Age of Onset   Heart attack Father    Cancer Mother    Diabetes Sister    Cancer Brother    Healthy Brother      Current Facility-Administered Medications (Endocrine & Metabolic):    methylPREDNISolone  acetate (DEPO-MEDROL ) injection 40 mg  Current Outpatient Medications (Cardiovascular):    atorvastatin (LIPITOR) 80 MG tablet, Take 80 mg by mouth daily.   ezetimibe (ZETIA) 10 MG tablet, Take 10 mg by mouth daily.   lisinopril (PRINIVIL,ZESTRIL) 10 MG tablet, Take 10 mg by mouth daily.   nitroGLYCERIN  (NITROSTAT ) 0.4 MG SL tablet, Place 1 tablet (0.4 mg total) under the tongue every 5 (five) minutes as needed for chest pain.   sildenafil (VIAGRA) 50 MG tablet, Take 50 mg by mouth daily as needed for erectile dysfunction.   Current Outpatient Medications (Respiratory):    fexofenadine (ALLEGRA) 180 MG tablet, Take 180 mg by mouth daily as needed for allergies.    fluticasone (FLONASE) 50 MCG/ACT nasal spray, Place 1 spray into both nostrils daily. PRN   Current Outpatient Medications (Analgesics):    aspirin 81 MG tablet, Take 81 mg by mouth daily.   IBUPROFEN & ACETAMINOPHEN PO, Take  by mouth as needed.     Current Outpatient Medications (Other):    Coenzyme Q10 (CO Q-10) 100 MG CAPS, Take 300 mg by mouth daily.   Mag Aspart-Potassium Aspart (POTASSIUM & MAGNESIUM ASPARTAT) 250-250 MG CAPS, Take 1 tablet by mouth daily.   Multiple Vitamin (MULTIVITAMIN) tablet, Take 1 tablet by mouth daily.   Omega-3 Fatty Acids (FISH OIL) 1000 MG CAPS, Take 2 capsules by mouth daily.   OVER THE COUNTER MEDICATION, Take 1 tablet by mouth daily. Med Name: PROTANDIM    Reviewed prior external information including notes and imaging from  primary care provider As well as notes that were available from care everywhere and other healthcare systems.  Past medical history, social, surgical and family history all reviewed in  electronic medical record.  No pertanent information unless stated regarding to the chief complaint.   Review of Systems:  No headache, visual changes, nausea, vomiting, diarrhea, constipation, dizziness, abdominal pain, skin rash, fevers, chills, night sweats, weight loss, swollen lymph nodes, body aches, joint swelling, chest pain, shortness of breath, mood changes. POSITIVE muscle aches  Objective  There were no vitals taken for this visit.   General: No apparent distress alert and oriented x3 mood and affect normal, dressed appropriately.  HEENT: Pupils equal, extraocular movements intact  Respiratory: Patient's speak in full sentences and does not appear short of breath  Cardiovascular: No lower extremity edema, non tender, no erythema  Knee exam shows    Impression and Recommendations:      The above documentation has been reviewed and is accurate and complete Rhemi Balbach M Dell Briner, DO

## 2024-08-29 ENCOUNTER — Ambulatory Visit: Admitting: Family Medicine

## 2024-09-13 DIAGNOSIS — M25551 Pain in right hip: Secondary | ICD-10-CM | POA: Diagnosis not present

## 2024-09-13 DIAGNOSIS — M25552 Pain in left hip: Secondary | ICD-10-CM | POA: Diagnosis not present

## 2024-09-27 NOTE — Progress Notes (Signed)
 [facility] [address] [place] [address] Phone: [phone] Subjective:   LILLETTE Berwyn Posey, am serving as a scribe for Dr. Arthea Claudene.  I'm seeing this patient by the request  of:  Koirala, Dibas, MD  CC: Knee pain follow-up, hip pain follow-up  YEP:Dlagzrupcz  07/04/2024 Patient does have patellofemoral arthritis noted.  Trace effusion noted.  Worsening pain consider injection.  VMO exercises given.  Discussed potential bracing.  Patient will be traveling and we will see him again in 6 weeks.   Seen orthopedics recently and going to continue with conservative therapy.     Stable at the moment.  No other significant changes necessary     Update 08/29/2024 MOHD CLEMONS is a 78 y.o. male coming in with complaint of L knee which is patellofemoral, R AC jt, and R hip pain.  Known patellofemoral arthritis.  Patient states that his shoulder has been doing well.   L knee is also doing well. Wears brace with cycling which is helpful.   Recent R hip xray by surgeon. Was told that he won't need surgery for a few years. Has been limping though due to pain over GT. Still having some groin pain intermittently. Going to see PT and dry needling has been helpful.     MRI of the lumbar spine does show mild spinal stenosis noted at L4-L5 and some progression of arthritis in multiple levels of the lumbar spine since previous imaging in 2018.  Past Medical History:  Diagnosis Date   ASCVD (arteriosclerotic cardiovascular disease)    3 vessel   Clotting disorder (HCC)    Esophageal reflux    Hyperlipidemia    Hypertension    Macular degeneration    Past Surgical History:  Procedure Laterality Date   CATARACT EXTRACTION     PTCA  10/14/2003   mid RCA   Social History   Socioeconomic History   Marital status: Married    Spouse name: Not on file   Number of children: 3   Years of education: Not on file   Highest education level: Not on file   Occupational History   Not on file  Tobacco Use   Smoking status: Former   Smokeless tobacco: Never  Vaping Use   Vaping status: Never Used  Substance and Sexual Activity   Alcohol use: Yes    Alcohol/week: 1.0 standard drink of alcohol    Types: 1 Glasses of wine per week    Comment: 1-2 per day   Drug use: No   Sexual activity: Yes  Other Topics Concern   Not on file  Social History Narrative   Not on file   Social Drivers of Health   Financial Resource Strain: Not on file  Food Insecurity: Not on file  Transportation Needs: Not on file  Physical Activity: Not on file  Stress: Not on file  Social Connections: Not on file   No Known Allergies Family History  Problem Relation Age of Onset   Heart attack Father    Cancer Mother    Diabetes Sister    Cancer Brother    Healthy Brother      Current Facility-Administered Medications (Endocrine & Metabolic):    methylPREDNISolone  acetate (DEPO-MEDROL ) injection 40 mg  Current Outpatient Medications (Cardiovascular):    atorvastatin (LIPITOR) 80 MG tablet, Take 80 mg by mouth daily.   ezetimibe (ZETIA) 10 MG tablet, Take 10 mg by mouth daily.   lisinopril (PRINIVIL,ZESTRIL) 10 MG  tablet, Take 10 mg by mouth daily.   nitroGLYCERIN  (NITROSTAT ) 0.4 MG SL tablet, Place 1 tablet (0.4 mg total) under the tongue every 5 (five) minutes as needed for chest pain.   sildenafil (VIAGRA) 50 MG tablet, Take 50 mg by mouth daily as needed for erectile dysfunction.   Current Outpatient Medications (Respiratory):    fexofenadine (ALLEGRA) 180 MG tablet, Take 180 mg by mouth daily as needed for allergies.    fluticasone (FLONASE) 50 MCG/ACT nasal spray, Place 1 spray into both nostrils daily. PRN   Current Outpatient Medications (Analgesics):    aspirin 81 MG tablet, Take 81 mg by mouth daily.   IBUPROFEN & ACETAMINOPHEN PO, Take by mouth as needed.     Current Outpatient Medications (Other):    Coenzyme Q10 (CO Q-10) 100 MG  CAPS, Take 300 mg by mouth daily.   Mag Aspart-Potassium Aspart (POTASSIUM & MAGNESIUM ASPARTAT) 250-250 MG CAPS, Take 1 tablet by mouth daily.   Multiple Vitamin (MULTIVITAMIN) tablet, Take 1 tablet by mouth daily.   Omega-3 Fatty Acids (FISH OIL) 1000 MG CAPS, Take 2 capsules by mouth daily.   OVER THE COUNTER MEDICATION, Take 1 tablet by mouth daily. Med Name: PROTANDIM    Reviewed prior external information including notes and imaging from  primary care provider As well as notes that were available from care everywhere and other healthcare systems.  Past medical history, social, surgical and family history all reviewed in electronic medical record.  No pertanent information unless stated regarding to the chief complaint.   Review of Systems:  No headache, visual changes, nausea, vomiting, diarrhea, constipation, dizziness, abdominal pain, skin rash, fevers, chills, night sweats, weight loss, swollen lymph nodes, body aches, joint swelling, chest pain, shortness of breath, mood changes. POSITIVE muscle aches  Objective  Vitals:   09/28/24 0951  BP: 124/64  Pulse: (!) 50  SpO2: 99%      General: No apparent distress alert and oriented x3 mood and affect normal, dressed appropriately.  HEENT: Pupils equal, extraocular movements intact  Respiratory: Patient's speak in full sentences and does not appear short of breath  Cardiovascular: No lower extremity edema, non tender, no erythema  Knee exam shows patient does have some arthritic changes noted.  Able to walk without any significant difficulty.  Shoulder exam shows relatively normal as well today.  Good range of motion noted.  Tender to palpation in the greater trochanteric area.  Positive Faber on the right side.   Procedure: Real-time Ultrasound Guided Injection of right greater trochanteric bursitis secondary to patient's body habitus Device: GE Logiq Q7 Ultrasound guided injection is preferred based studies that show  increased duration, increased effect, greater accuracy, decreased procedural pain, increased response rate, and decreased cost with ultrasound guided versus blind injection.  Verbal informed consent obtained.  Time-out conducted.  Noted no overlying erythema, induration, or other signs of local infection.  Skin prepped in a sterile fashion.  Local anesthesia: Topical Ethyl chloride.  With sterile technique and under real time ultrasound guidance:  Greater trochanteric area was visualized and patient's bursa was noted. A 22-gauge 3 inch needle was inserted and 4 cc of 0.5% Marcaine and 1 cc of Kenalog 40 mg/dL was injected. Pictures taken Completed without difficulty  Pain immediately resolved suggesting accurate placement of the medication.  Advised to call if fevers/chills, erythema, induration, drainage, or persistent bleeding.  Images permanently stored Impression: Technically successful ultrasound guided injection.    Impression and Recommendations:  The above documentation has been reviewed and is accurate and complete Zhane Bluitt M Naeemah Jasmer, DO

## 2024-09-28 ENCOUNTER — Encounter: Payer: Self-pay | Admitting: Family Medicine

## 2024-09-28 ENCOUNTER — Ambulatory Visit: Admitting: Family Medicine

## 2024-09-28 ENCOUNTER — Other Ambulatory Visit: Payer: Self-pay

## 2024-09-28 VITALS — BP 124/64 | HR 50 | Ht 70.0 in | Wt 183.0 lb

## 2024-09-28 DIAGNOSIS — M7061 Trochanteric bursitis, right hip: Secondary | ICD-10-CM

## 2024-09-28 DIAGNOSIS — M160 Bilateral primary osteoarthritis of hip: Secondary | ICD-10-CM

## 2024-09-28 DIAGNOSIS — M25511 Pain in right shoulder: Secondary | ICD-10-CM

## 2024-09-28 DIAGNOSIS — M1712 Unilateral primary osteoarthritis, left knee: Secondary | ICD-10-CM

## 2024-09-28 NOTE — Assessment & Plan Note (Signed)
 Discussed with patient about icing regimen and home exercises, which activities to do and which ones to avoid.  Increase activity slowly.  Discussed icing regimen.  Follow-up again in 12 weeks.

## 2024-09-28 NOTE — Patient Instructions (Addendum)
 Injected GT today Switch to using flippers See me in 2 months

## 2024-09-28 NOTE — Assessment & Plan Note (Signed)
Stable at the moment. 

## 2024-09-28 NOTE — Assessment & Plan Note (Signed)
 Known arthritis but stable.  Continue work on Designer, fashion/clothing and swimming.  Discussed wearing flippers

## 2024-10-01 NOTE — Progress Notes (Signed)
 [facility] - Clinic Note  10/03/2024    CHIEF COMPLAINT Patient presents for Retina Follow Up  HISTORY OF PRESENT ILLNESS: Raymond Alexander is a 78 y.o. male who presents to the clinic today for:   HPI     Retina Follow Up   Patient presents with  Wet AMD.  In both eyes.  Severity is moderate.  Duration of 6 weeks.  Since onset it is stable.  I, the attending physician,  performed the HPI with the patient and updated documentation appropriately.        Comments   Pt here for 6 wk ret f/u exu ARMD OU. Pt states he's had a little difficulty reading small letters, esp w/o lighting.       Last edited by Valdemar Rogue, MD on 10/04/2024  1:33 AM.    Pt states the vision is the same.   Referring physician: Regino Slater, MD [address] [place],  [place] [address]  HISTORICAL INFORMATION:   Selected notes from the MEDICAL RECORD NUMBER Referred by Dr. Glendia for concern of retinal edema OU   CURRENT MEDICATIONS: No current outpatient medications on file. (Ophthalmic Drugs)   No current facility-administered medications for this visit. (Ophthalmic Drugs)   Current Outpatient Medications (Other)  Medication Sig   aspirin 81 MG tablet Take 81 mg by mouth daily.   atorvastatin (LIPITOR) 80 MG tablet Take 80 mg by mouth daily.   Coenzyme Q10 (CO Q-10) 100 MG CAPS Take 300 mg by mouth daily.   ezetimibe (ZETIA) 10 MG tablet Take 10 mg by mouth daily.   fexofenadine (ALLEGRA) 180 MG tablet Take 180 mg by mouth daily as needed for allergies.    fluticasone (FLONASE) 50 MCG/ACT nasal spray Place 1 spray into both nostrils daily. PRN   IBUPROFEN & ACETAMINOPHEN PO Take by mouth as needed.   lisinopril (PRINIVIL,ZESTRIL) 10 MG tablet Take 10 mg by mouth daily.   Mag Aspart-Potassium Aspart (POTASSIUM & MAGNESIUM ASPARTAT) 250-250 MG CAPS Take 1 tablet by mouth daily.   Multiple Vitamin (MULTIVITAMIN) tablet Take 1 tablet by mouth daily.    nitroGLYCERIN  (NITROSTAT ) 0.4 MG SL tablet Place 1 tablet (0.4 mg total) under the tongue every 5 (five) minutes as needed for chest pain.   Omega-3 Fatty Acids (FISH OIL) 1000 MG CAPS Take 2 capsules by mouth daily.   OVER THE COUNTER MEDICATION Take 1 tablet by mouth daily. Med Name: PROTANDIM   sildenafil (VIAGRA) 50 MG tablet Take 50 mg by mouth daily as needed for erectile dysfunction.   Current Facility-Administered Medications (Other)  Medication Route   methylPREDNISolone  acetate (DEPO-MEDROL ) injection 40 mg Other   REVIEW OF SYSTEMS: ROS   Positive for: Cardiovascular, Eyes Negative for: Constitutional, Gastrointestinal, Neurological, Skin, Genitourinary, Musculoskeletal, HENT, Endocrine, Respiratory, Psychiatric, Allergic/Imm, Heme/Lymph Last edited by Antonetta Almetta BRAVO, COT on 10/03/2024 12:32 PM.     ALLERGIES No Known Allergies  PAST MEDICAL HISTORY Past Medical History:  Diagnosis Date   ASCVD (arteriosclerotic cardiovascular disease)    3 vessel   Clotting disorder    Esophageal reflux    Hyperlipidemia    Hypertension    Macular degeneration    Past Surgical History:  Procedure Laterality Date   CATARACT EXTRACTION     PTCA  10/14/2003   mid RCA   FAMILY HISTORY Family History  Problem Relation Age of Onset   Heart attack Father    Cancer Mother    Diabetes  Sister    Cancer Brother    Healthy Brother    SOCIAL HISTORY Social History   Tobacco Use   Smoking status: Former   Smokeless tobacco: Never  Vaping Use   Vaping status: Never Used  Substance Use Topics   Alcohol use: Yes    Alcohol/week: 1.0 standard drink of alcohol    Types: 1 Glasses of wine per week    Comment: 1-2 per day   Drug use: No       OPHTHALMIC EXAM:  Base Eye Exam     Visual Acuity (Snellen - Linear)       Right Left   Dist Lilly 20/20 -2 20/20 -2         Tonometry (Tonopen, 12:37 PM)       Right Left   Pressure 16 13         Pupils       Dark  Light Shape React APD   Right 3 2 Round Brisk None   Left 3 2 Round Brisk None         Visual Fields (Counting fingers)       Left Right    Full Full         Extraocular Movement       Right Left    Full, Ortho Full, Ortho         Neuro/Psych     Oriented x3: Yes   Mood/Affect: Normal         Dilation     Both eyes: 1.0% Mydriacyl, 2.5% Phenylephrine @ 12:38 PM           Slit Lamp and Fundus Exam     Slit Lamp Exam       Right Left   Lids/Lashes Normal small stye nasal LL margin   Conjunctiva/Sclera White and quiet White and quiet   Cornea arcus, well healed cataract wound, trace PEE arcus, well healed cataract wound, trace PEE   Anterior Chamber Deep and quiet Deep and quiet   Iris Round and dilated Round and dilated   Lens PC IOL in good position Toric PC IOL in good position with marks at 0530 and 1100   Anterior Vitreous Mild Vitreous syneresis, no cell or pigment, low lying Posterior vitreous detachment, Weiss ring Mild Vitreous syneresis, no cell or pigment, Posterior vitreous detachment         Fundus Exam       Right Left   Disc Pink and Sharp, mild peripapillary edema, ?peripapillary CNV ST to disc, no heme Pink and Sharp, peripapillary cystic changes -- slightly increased, CNV ST disc, no heme   C/D Ratio 0.5 0.4   Macula Good foveal reflex, nasal cystic changes/edema -- slightly increased, no heme Good foveal reflex, mild edema nasal macula -- slightly increased, no heme   Vessels attenuated, Tortuous attenuated, Tortuous   Periphery Attached, No heme, no RT/RD Attached, No heme            Refraction     Wearing Rx       Sphere Cylinder Axis Add   Right [place] +0.25 060 +2.25   Left +0.25 Sphere  +2.25    Type: Progressives           IMAGING AND PROCEDURES  Imaging and Procedures for 10/03/2024  OCT, Retina - OU - Both Eyes       Right Eye Quality was good. Central Foveal Thickness: 289. Progression has worsened.  Findings include normal foveal contour,  no SRF, retinal drusen , intraretinal fluid, pigment epithelial detachment (Persistent peripapillary IRF/cystic changes-- slightly increased, stable improvement in focal SRF superior to disc, no fluid centrally).   Left Eye Quality was good. Central Foveal Thickness: 275. Progression has worsened. Findings include normal foveal contour, retinal drusen , intraretinal fluid, subretinal fluid (Persistent peripapillary IRF nasal macula--slightly increased, persistent shallow SRF, no fluid centrally).   Notes *Images captured and stored on drive  Diagnosis / Impression:  OD: Persistent peripapillary IRF/cystic changes- slightly increased, stable improvement in focal SRF superior to disc, no fluid centrally OS: Persistent peripapillary IRF nasal macula--slightly increased, persistent shallow SRF, no fluid centrally  Clinical management:  See below  Abbreviations: NFP - Normal foveal profile. CME - cystoid macular edema. PED - pigment epithelial detachment. IRF - intraretinal fluid. SRF - subretinal fluid. EZ - ellipsoid zone. ERM - epiretinal membrane. ORA - outer retinal atrophy. ORT - outer retinal tubulation. SRHM - subretinal hyper-reflective material. IRHM - intraretinal hyper-reflective material      Intravitreal Injection, Pharmacologic Agent - OD - Right Eye       Time Out 10/03/2024. 1:03 PM. Confirmed correct patient, procedure, site, and patient consented.   Anesthesia Topical anesthesia was used. Anesthetic medications included Lidocaine 2%, Proparacaine 0.5%.   Procedure Preparation included 5% betadine to ocular surface, eyelid speculum. A (32g) needle was used.   Injection: 6 mg faricimab -svoa 6 MG/0.05ML (Patient supplied)   Route: Intravitreal, Site: Right Eye   NDC: 49757-903-93, Lot: [id number], Expiration date: 09/11/2026, Waste: 0 mL   Post-op Post injection exam found visual acuity of at least counting fingers. The patient  tolerated the procedure well. There were no complications. The patient received written and verbal post procedure care education. Post injection medications were not given.   Notes **SAMPLE MEDICATION ADMINISTERED**     Intravitreal Injection, Pharmacologic Agent - OS - Left Eye       Time Out 10/03/2024. 1:03 PM. Confirmed correct patient, procedure, site, and patient consented.   Anesthesia Topical anesthesia was used. Anesthetic medications included Lidocaine 2%, Proparacaine 0.5%.   Procedure Preparation included 5% betadine to ocular surface, eyelid speculum. A supplied (32g) needle was used.   Injection: 1.25 mg Bevacizumab  1.25mg /0.45ml   Route: Intravitreal, Site: Left Eye   NDC: C2662926, Lot: [id number], Expiration date: 10/14/2024   Post-op Post injection exam found visual acuity of at least counting fingers. The patient tolerated the procedure well. There were no complications. The patient received written and verbal post procedure care education. Post injection medications were not given.            ASSESSMENT/PLAN:    ICD-10-CM   1. Exudative age-related macular degeneration of both eyes with active choroidal neovascularization (HCC)  H35.3231 OCT, Retina - OU - Both Eyes    Intravitreal Injection, Pharmacologic Agent - OD - Right Eye    Intravitreal Injection, Pharmacologic Agent - OS - Left Eye    Bevacizumab  (AVASTIN ) SOLN 1.25 mg    faricimab -svoa (VABYSMO ) 6mg /0.66mL intravitreal injection    2. Essential hypertension  I10     3. Hypertensive retinopathy of both eyes  H35.033     4. Pseudophakia, both eyes  Z96.1      1. Peripapillary CNV / exudative ARMD OU - 8.01.22 pt with 1 wk history of decreased vision OS and isolated light green flashes OD -- flashes OD resolved, blurriness OS improving  - 10.04.22 small recurrent episode of flashes OD last week.   - 11.15.22 subjectively blurred vision  OS - repeat FA (08.23.22) shows mild peripapillary  staining and ?leakage OU -- ?CNV  - unclear etiology, but suspect ARMD vs idiopathic CNV - s/p IVA OS #1 (11.15.22), #2 (12.13.22), #3 (01.10.23), #4 (02.09.23), #5 (01.29.25), #6 (02.26.25), #7 (04.07.25), #8 (05.13.25), #9 (06.17.25), #10 (07.29.25), #10 (9.09.25) - s/p IVA OD #1 (01.29.25), #2 (02.26.25) ==================================== - s/p IVE OS #1 (04.18.23), #2 (05.16.23), #3 (06.13.23), #4 (07.17.23), #5 (08.15.23), #6 (09.13.23), #7 (10.18.23), #8 (11.27.23), #9 (01.04.24), #10 (02.15.24), #11 (03.21.24), #12 (04.25.24), #13 (05.23.24), #14 (07.08.24), #15 (08.15.24), #16 (09.25.24), #17 (11.06.24), #18 (11.06.24) - s/p IVE OD #1 (07.17.23), #2 (08.15.23), #3 (09.13.23), #4 (10.18.23), #5 (11.27.23), #6 (01.04.24), #7 (02.15.24), #8 (03.21.24), #9 (04.25.24), #10 (05.23.24), #11 (07.08.24), #12 (08.15.24), #13 (09.25.24), #14 (11.06.24), #15 (11.06.24) ================================ - s/p IVV OD #1 (04.07.25, sample), #2 (05.13.25, sample), #3 (06.17.25, sample), #4 (sample 07.29.25), #5 (sample 09.09.25) =========================== - BCVA stable at 20/20 OU - OCT shows OD: Persistent peripapillary IRF/cystic changes- slightly increased, stable improvement in focal SRF superior to disc, no fluid centrally; OS: Persistent peripapillary IRF nasal macula--slightly increased, persistent shallow SRF, no fluid centrally at 6 wks - recommend IVA OS #11 and IVV OD #6 (sample) today, 09.09.25 with follow up in 5-6 wks - pt wishes to proceed with injections  - RBA of procedure discussed, questions answered - IVA informed consent obtained and signed, 01.29.25 (OU) - IVV informed consent obtained and signed, 04.09.25 (OD) - see procedure note   - cont Amsler grid monitoring  - Eylea  approved for 2025, but Good Days funding unavailable  - f/u in 5-6 weeks, DFE, OCT, possible injections  2,3. Hypertensive retinopathy OU - discussed importance of tight BP control - continue to monitor  4.  Pseudophakia OU  - s/p CE/IOL (Dr. Lavonia, 2022)  - IOLs in good position, doing well - continue to monitor  Ophthalmic Meds Ordered this visit:  Meds ordered this encounter  Medications   Bevacizumab  (AVASTIN ) SOLN 1.25 mg   faricimab -svoa (VABYSMO ) 6mg /0.90mL intravitreal injection     Return in about 5 weeks (around 11/07/2024) for f/u, Ex. AMD, DFE, OCT, Possible, IVV, OD, IVA, OS.  There are no Patient Instructions on file for this visit.  This document serves as a record of services personally performed by Redell JUDITHANN Hans, MD, PhD. It was created on their behalf by Almetta Pesa, an ophthalmic technician. The creation of this record is the provider's dictation and/or activities during the visit.    Electronically signed by: Almetta Pesa, OA, 10/04/24  1:34 AM  This document serves as a record of services personally performed by Redell JUDITHANN Hans, MD, PhD. It was created on their behalf by Wanda GEANNIE Keens, COT an ophthalmic technician. The creation of this record is the provider's dictation and/or activities during the visit.    Electronically signed by:  Wanda GEANNIE Keens, COT  10/04/24 1:34 AM  Redell JUDITHANN Hans, M.D., Ph.D. Diseases & Surgery of the Retina and Vitreous [facility]  I have reviewed the above documentation for accuracy and completeness, and I agree with the above. Redell JUDITHANN Hans, M.D., Ph.D. 10/04/24 1:36 AM   Abbreviations: M myopia (nearsighted); A astigmatism; H hyperopia (farsighted); P presbyopia; Mrx spectacle prescription;  CTL contact lenses; OD right eye; OS left eye; OU both eyes  XT exo meibomian gland dysfunction; ATs artificial tears; PFAT's preservative free artificial tears; NSC nuclear sclerotic cataract; PSC posterior subcapsular cataract; ERM epi-retinal membrane; PVD posterior vitreous detachment;  RD retinal detachment; DM diabetes mellitus; DR diabetic retinopathy; NPDR non-proliferative diabetic retinopathy;  PDR proliferative diabetic retinopathy; CSME clinically significant macular edema; DME diabetic macular edema; dbh dot blot hemorrhages; CWS cotton wool spot; POAG primary open angle glaucoma; C/D cup-to-disc ratio; HVF humphrey visual field; GVF goldmann visual field; OCT optical coherence tomography; IOP intraocular pressure; BRVO Branch retinal vein occlusion; CRVO central retinal vein occlusion; CRAO central retinal artery occlusion; BRAO branch retinal artery occlusion; RT retinal tear; SB scleral buckle; PPV pars plana vitrectomy; VH Vitreous hemorrhage; PRP panretinal laser photocoagulation; IVK intravitreal kenalog; VMT vitreomacular traction; MH Macular hole;  NVD neovascularization of the disc; NVE neovascularization elsewhere; AREDS age related eye disease study; ARMD age related macular degeneration; POAG primary open angle glaucoma; EBMD epithelial/anterior basement membrane dystrophy; ACIOL anterior chamber intraocular lens; IOL intraocular lens; PCIOL posterior chamber intraocular lens; Phaco/IOL phacoemulsification with intraocular lens placement; PRK photorefractive keratectomy; LASIK laser assisted in situ keratomileusis; HTN hypertension; DM diabetes mellitus; COPD chronic obstructive pulmonary disease

## 2024-10-03 ENCOUNTER — Encounter (INDEPENDENT_AMBULATORY_CARE_PROVIDER_SITE_OTHER): Payer: Self-pay | Admitting: Ophthalmology

## 2024-10-03 ENCOUNTER — Ambulatory Visit (INDEPENDENT_AMBULATORY_CARE_PROVIDER_SITE_OTHER): Admitting: Ophthalmology

## 2024-10-03 DIAGNOSIS — I10 Essential (primary) hypertension: Secondary | ICD-10-CM | POA: Diagnosis not present

## 2024-10-03 DIAGNOSIS — H353231 Exudative age-related macular degeneration, bilateral, with active choroidal neovascularization: Secondary | ICD-10-CM

## 2024-10-03 DIAGNOSIS — Z961 Presence of intraocular lens: Secondary | ICD-10-CM

## 2024-10-03 DIAGNOSIS — H35033 Hypertensive retinopathy, bilateral: Secondary | ICD-10-CM | POA: Diagnosis not present

## 2024-10-04 ENCOUNTER — Encounter (INDEPENDENT_AMBULATORY_CARE_PROVIDER_SITE_OTHER): Payer: Self-pay | Admitting: Ophthalmology

## 2024-10-04 MED ORDER — BEVACIZUMAB CHEMO INJECTION 1.25MG/0.05ML SYRINGE FOR KALEIDOSCOPE
1.2500 mg | INTRAVITREAL | Status: AC | PRN
  Administered 2024-10-04: 1.25 mg via INTRAVITREAL

## 2024-10-04 MED ORDER — FARICIMAB-SVOA 6 MG/0.05ML IZ SOSY
6.0000 mg | PREFILLED_SYRINGE | INTRAVITREAL | Status: AC | PRN
  Administered 2024-10-04: 6 mg via INTRAVITREAL

## 2024-10-08 DIAGNOSIS — H00022 Hordeolum internum right lower eyelid: Secondary | ICD-10-CM | POA: Diagnosis not present

## 2024-10-12 DIAGNOSIS — R252 Cramp and spasm: Secondary | ICD-10-CM | POA: Diagnosis not present

## 2024-10-12 DIAGNOSIS — E7800 Pure hypercholesterolemia, unspecified: Secondary | ICD-10-CM | POA: Diagnosis not present

## 2024-10-12 DIAGNOSIS — I2510 Atherosclerotic heart disease of native coronary artery without angina pectoris: Secondary | ICD-10-CM | POA: Diagnosis not present

## 2024-10-12 DIAGNOSIS — I10 Essential (primary) hypertension: Secondary | ICD-10-CM | POA: Diagnosis not present

## 2024-10-12 DIAGNOSIS — Z79899 Other long term (current) drug therapy: Secondary | ICD-10-CM | POA: Diagnosis not present

## 2024-10-12 DIAGNOSIS — H00022 Hordeolum internum right lower eyelid: Secondary | ICD-10-CM | POA: Diagnosis not present

## 2024-10-15 ENCOUNTER — Encounter: Payer: Self-pay | Admitting: Radiology

## 2024-10-23 DIAGNOSIS — M79605 Pain in left leg: Secondary | ICD-10-CM | POA: Diagnosis not present

## 2024-10-26 DIAGNOSIS — M79605 Pain in left leg: Secondary | ICD-10-CM | POA: Diagnosis not present

## 2024-10-31 DIAGNOSIS — M79605 Pain in left leg: Secondary | ICD-10-CM | POA: Diagnosis not present

## 2024-11-02 DIAGNOSIS — M79605 Pain in left leg: Secondary | ICD-10-CM | POA: Diagnosis not present

## 2024-11-05 NOTE — Progress Notes (Shared)
 [facility] - Clinic Note  11/14/2024    CHIEF COMPLAINT Patient presents for No chief complaint on file.  HISTORY OF PRESENT ILLNESS: Raymond Alexander is a 78 y.o. male who presents to the clinic today for:    Pt states the vision is the same.   Referring physician: Regino Slater, MD [address] [place],  [place] [address]  HISTORICAL INFORMATION:   Selected notes from the MEDICAL RECORD NUMBER Referred by Dr. Glendia for concern of retinal edema OU   CURRENT MEDICATIONS: No current outpatient medications on file. (Ophthalmic Drugs)   No current facility-administered medications for this visit. (Ophthalmic Drugs)   Current Outpatient Medications (Other)  Medication Sig   aspirin 81 MG tablet Take 81 mg by mouth daily.   atorvastatin (LIPITOR) 80 MG tablet Take 80 mg by mouth daily.   Coenzyme Q10 (CO Q-10) 100 MG CAPS Take 300 mg by mouth daily.   ezetimibe (ZETIA) 10 MG tablet Take 10 mg by mouth daily.   fexofenadine (ALLEGRA) 180 MG tablet Take 180 mg by mouth daily as needed for allergies.    fluticasone (FLONASE) 50 MCG/ACT nasal spray Place 1 spray into both nostrils daily. PRN   IBUPROFEN & ACETAMINOPHEN PO Take by mouth as needed.   lisinopril (PRINIVIL,ZESTRIL) 10 MG tablet Take 10 mg by mouth daily.   Mag Aspart-Potassium Aspart (POTASSIUM & MAGNESIUM ASPARTAT) 250-250 MG CAPS Take 1 tablet by mouth daily.   Multiple Vitamin (MULTIVITAMIN) tablet Take 1 tablet by mouth daily.   nitroGLYCERIN  (NITROSTAT ) 0.4 MG SL tablet Place 1 tablet (0.4 mg total) under the tongue every 5 (five) minutes as needed for chest pain.   Omega-3 Fatty Acids (FISH OIL) 1000 MG CAPS Take 2 capsules by mouth daily.   OVER THE COUNTER MEDICATION Take 1 tablet by mouth daily. Med Name: PROTANDIM   sildenafil (VIAGRA) 50 MG tablet Take 50 mg by mouth daily as needed for erectile dysfunction.   Current Facility-Administered Medications (Other)  Medication  Route   methylPREDNISolone  acetate (DEPO-MEDROL ) injection 40 mg Other   REVIEW OF SYSTEMS:   ALLERGIES No Known Allergies  PAST MEDICAL HISTORY Past Medical History:  Diagnosis Date   ASCVD (arteriosclerotic cardiovascular disease)    3 vessel   Clotting disorder    Esophageal reflux    Hyperlipidemia    Hypertension    Macular degeneration    Past Surgical History:  Procedure Laterality Date   CATARACT EXTRACTION     PTCA  10/14/2003   mid RCA   FAMILY HISTORY Family History  Problem Relation Age of Onset   Heart attack Father    Cancer Mother    Diabetes Sister    Cancer Brother    Healthy Brother    SOCIAL HISTORY Social History   Tobacco Use   Smoking status: Former   Smokeless tobacco: Never  Vaping Use   Vaping status: Never Used  Substance Use Topics   Alcohol use: Yes    Alcohol/week: 1.0 standard drink of alcohol    Types: 1 Glasses of wine per week    Comment: 1-2 per day   Drug use: No       OPHTHALMIC EXAM:  Not recorded    IMAGING AND PROCEDURES  Imaging and Procedures for 11/14/2024          ASSESSMENT/PLAN:  No diagnosis found.  1. Peripapillary CNV / exudative ARMD OU - 8.01.22 pt with 1 wk history of decreased  vision OS and isolated light green flashes OD -- flashes OD resolved, blurriness OS improving  - 10.04.22 small recurrent episode of flashes OD last week.   - 11.15.22 subjectively blurred vision OS - repeat FA (08.23.22) shows mild peripapillary staining and ?leakage OU -- ?CNV  - unclear etiology, but suspect ARMD vs idiopathic CNV - s/p IVA OS #1 (11.15.22), #2 (12.13.22), #3 (01.10.23), #4 (02.09.23), #5 (01.29.25), #6 (02.26.25), #7 (04.07.25), #8 (05.13.25), #9 (06.17.25), #10 (07.29.25), #10 (9.09.25), #11 (10.22.25) - s/p IVA OD #1 (01.29.25), #2 (02.26.25) ==================================== - s/p IVE OS #1 (04.18.23), #2 (05.16.23), #3 (06.13.23), #4 (07.17.23), #5 (08.15.23), #6 (09.13.23), #7 (10.18.23),  #8 (11.27.23), #9 (01.04.24), #10 (02.15.24), #11 (03.21.24), #12 (04.25.24), #13 (05.23.24), #14 (07.08.24), #15 (08.15.24), #16 (09.25.24), #17 (11.06.24), #18 (11.06.24) - s/p IVE OD #1 (07.17.23), #2 (08.15.23), #3 (09.13.23), #4 (10.18.23), #5 (11.27.23), #6 (01.04.24), #7 (02.15.24), #8 (03.21.24), #9 (04.25.24), #10 (05.23.24), #11 (07.08.24), #12 (08.15.24), #13 (09.25.24), #14 (11.06.24), #15 (11.06.24) ================================ - s/p IVV OD #1 (04.07.25, sample), #2 (05.13.25, sample), #3 (06.17.25, sample), #4 (sample 07.29.25), #5 (sample 09.09.25), #6 (sample 10.22.25) =========================== - BCVA stable at 20/20 OU - OCT shows OD: Persistent peripapillary IRF/cystic changes- slightly increased, stable improvement in focal SRF superior to disc, no fluid centrally; OS: Persistent peripapillary IRF nasal macula--slightly increased, persistent shallow SRF, no fluid centrally at 6 wks - recommend IVA OS #12 and IVV OD #7 (sample) today, 12.03.25 with follow up in 5-6 wks - pt wishes to proceed with injections  - RBA of procedure discussed, questions answered - IVA informed consent obtained and signed, 01.29.25 (OU) - IVV informed consent obtained and signed, 04.09.25 (OD) - see procedure note   - cont Amsler grid monitoring  - Eylea  approved for 2025, but [employer] Days funding unavailable  - f/u in 5-6 weeks, DFE, OCT, possible injections  2,3. Hypertensive retinopathy OU - discussed importance of tight BP control - continue to monitor  4. Pseudophakia OU  - s/p CE/IOL (Dr. Lavonia, 2022)  - IOLs in good position, doing well - continue to monitor  Ophthalmic Meds Ordered this visit:  No orders of the defined types were placed in this encounter.    No follow-ups on file.  There are no Patient Instructions on file for this visit.  This document serves as a record of services personally performed by [place] JUDITHANN Hans, MD, PhD. It was created on their behalf by Almetta Pesa, an ophthalmic technician. The creation of this record is the provider's dictation and/or activities during the visit.    Electronically signed by: Almetta Pesa, OA, 11/05/24  10:50 AM    [place] JUDITHANN Hans, M.D., Ph.D. Diseases & Surgery of the Retina and Vitreous [facility]   Abbreviations: M myopia (nearsighted); A astigmatism; H hyperopia (farsighted); P presbyopia; Mrx spectacle prescription;  CTL contact lenses; OD right eye; OS left eye; OU both eyes  XT exo meibomian gland dysfunction; ATs artificial tears; PFAT's preservative free artificial tears; NSC nuclear sclerotic cataract; PSC posterior subcapsular cataract; ERM epi-retinal membrane; PVD posterior vitreous detachment; RD retinal detachment; DM diabetes mellitus; DR diabetic retinopathy; NPDR non-proliferative diabetic retinopathy; PDR proliferative diabetic retinopathy; CSME clinically significant macular edema; DME diabetic macular edema; dbh dot blot hemorrhages; CWS cotton wool spot; POAG primary open angle glaucoma; C/D cup-to-disc ratio; HVF humphrey visual field; GVF goldmann visual field; OCT optical coherence tomography; IOP intraocular pressure; BRVO Branch retinal vein occlusion; CRVO central retinal vein occlusion; CRAO central  retinal artery occlusion; BRAO branch retinal artery occlusion; RT retinal tear; SB scleral buckle; PPV pars plana vitrectomy; VH Vitreous hemorrhage; PRP panretinal laser photocoagulation; IVK intravitreal kenalog; VMT vitreomacular traction; MH Macular hole;  NVD neovascularization of the disc; NVE neovascularization elsewhere; AREDS age related eye disease study; ARMD age related macular degeneration; POAG primary open angle glaucoma; EBMD epithelial/anterior basement membrane dystrophy; ACIOL anterior chamber intraocular lens; IOL intraocular lens; PCIOL posterior chamber intraocular lens; Phaco/IOL phacoemulsification with intraocular lens placement; PRK  photorefractive keratectomy; LASIK laser assisted in situ keratomileusis; HTN hypertension; DM diabetes mellitus; COPD chronic obstructive pulmonary disease

## 2024-11-13 NOTE — Progress Notes (Signed)
 [facility] - Clinic Note  11/19/2024    CHIEF COMPLAINT Patient presents for Retina Follow Up  HISTORY OF PRESENT ILLNESS: Raymond Alexander is a 78 y.o. male who presents to the clinic today for:   HPI     Retina Follow Up   Patient presents with  Wet AMD.  In both eyes.  This started 5 weeks ago.        Comments   Patient here for 5 weeks retina follow up for exu ARMD OU. Patient states vision doing reasonably ok. Had recent new glasses. Last week. No eye pain. After last shots OD was sore from lid speculum. Developed into styes and had to have treated. Recovering from a cold. Started last week.       Last edited by Orval Asberry RAMAN, COA on 11/19/2024  8:13 AM.     Pt states the vision is the same. He states he is seeing small floaters.   Referring physician: Regino Slater, MD [address] [place],  [place] [address]  HISTORICAL INFORMATION:   Selected notes from the MEDICAL RECORD NUMBER Referred by Dr. Glendia for concern of retinal edema OU   CURRENT MEDICATIONS: No current outpatient medications on file. (Ophthalmic Drugs)   No current facility-administered medications for this visit. (Ophthalmic Drugs)   Current Outpatient Medications (Other)  Medication Sig   aspirin 81 MG tablet Take 81 mg by mouth daily.   atorvastatin (LIPITOR) 80 MG tablet Take 80 mg by mouth daily.   Coenzyme Q10 (CO Q-10) 100 MG CAPS Take 300 mg by mouth daily.   ezetimibe (ZETIA) 10 MG tablet Take 10 mg by mouth daily.   fexofenadine (ALLEGRA) 180 MG tablet Take 180 mg by mouth daily as needed for allergies.    fluticasone (FLONASE) 50 MCG/ACT nasal spray Place 1 spray into both nostrils daily. PRN   IBUPROFEN & ACETAMINOPHEN PO Take by mouth as needed.   lisinopril (PRINIVIL,ZESTRIL) 10 MG tablet Take 10 mg by mouth daily.   Mag Aspart-Potassium Aspart (POTASSIUM & MAGNESIUM ASPARTAT) 250-250 MG CAPS Take 1 tablet by mouth daily.   Multiple Vitamin  (MULTIVITAMIN) tablet Take 1 tablet by mouth daily.   nitroGLYCERIN  (NITROSTAT ) 0.4 MG SL tablet Place 1 tablet (0.4 mg total) under the tongue every 5 (five) minutes as needed for chest pain.   Omega-3 Fatty Acids (FISH OIL) 1000 MG CAPS Take 2 capsules by mouth daily.   OVER THE COUNTER MEDICATION Take 1 tablet by mouth daily. Med Name: PROTANDIM   sildenafil (VIAGRA) 50 MG tablet Take 50 mg by mouth daily as needed for erectile dysfunction.   Current Facility-Administered Medications (Other)  Medication Route   methylPREDNISolone  acetate (DEPO-MEDROL ) injection 40 mg Other   REVIEW OF SYSTEMS: ROS   Positive for: Cardiovascular, Eyes Negative for: Constitutional, Gastrointestinal, Neurological, Skin, Genitourinary, Musculoskeletal, HENT, Endocrine, Respiratory, Psychiatric, Allergic/Imm, Heme/Lymph Last edited by Orval Asberry RAMAN, COA on 11/19/2024  8:13 AM.      ALLERGIES No Known Allergies  PAST MEDICAL HISTORY Past Medical History:  Diagnosis Date   ASCVD (arteriosclerotic cardiovascular disease)    3 vessel   Clotting disorder    Esophageal reflux    Hyperlipidemia    Hypertension    Macular degeneration    Past Surgical History:  Procedure Laterality Date   CATARACT EXTRACTION     PTCA  10/14/2003   mid RCA   FAMILY HISTORY Family History  Problem Relation Age of Onset  Heart attack Father    Cancer Mother    Diabetes Sister    Cancer Brother    Healthy Brother    SOCIAL HISTORY Social History   Tobacco Use   Smoking status: Former   Smokeless tobacco: Never  Vaping Use   Vaping status: Never Used  Substance Use Topics   Alcohol use: Yes    Alcohol/week: 1.0 standard drink of alcohol    Types: 1 Glasses of wine per week    Comment: 1-2 per day   Drug use: No       OPHTHALMIC EXAM:  Base Eye Exam     Visual Acuity (Snellen - Linear)       Right Left   Dist cc 20/20 20/20    Correction: Glasses  New lenses in glasses.         Tonometry (Tonopen, 8:09 AM)       Right Left   Pressure 18 16         Pupils       Dark Light Shape React APD   Right 3 2 Round Brisk None   Left 3 2 Round Brisk None         Visual Fields (Counting fingers)       Left Right    Full Full         Extraocular Movement       Right Left    Full, Ortho Full, Ortho         Neuro/Psych     Oriented x3: Yes   Mood/Affect: Normal         Dilation     Both eyes: 1.0% Mydriacyl, 2.5% Phenylephrine @ 8:09 AM           Slit Lamp and Fundus Exam     Slit Lamp Exam       Right Left   Lids/Lashes Normal small stye nasal LL margin   Conjunctiva/Sclera White and quiet White and quiet   Cornea arcus, well healed cataract wound, trace PEE arcus, well healed cataract wound, trace PEE   Anterior Chamber Deep and quiet Deep and quiet   Iris Round and dilated Round and dilated   Lens PC IOL in good position Toric PC IOL in good position with marks at 0530 and 1100   Anterior Vitreous Mild Vitreous syneresis, no cell or pigment, low lying Posterior vitreous detachment, Weiss ring Mild Vitreous syneresis, no cell or pigment, Posterior vitreous detachment         Fundus Exam       Right Left   Disc Pink and Sharp, mild peripapillary edema, ?peripapillary CNV ST to disc, no heme Pink and Sharp, peripapillary cystic changes -- slightly increased, CNV ST disc, no heme   C/D Ratio 0.5 0.4   Macula Good foveal reflex, nasal cystic changes/edema -- slightly increased, no heme Good foveal reflex, mild edema nasal macula -- slightly increased, no heme   Vessels attenuated, Tortuous attenuated, Tortuous   Periphery Attached, No heme, no RT/RD Attached, No heme            Refraction     Wearing Rx       Sphere Cylinder Axis Add   Right +0.25 +0.25 153 +2.25   Left +0.00 +0.50 177 +2.25    Type: Progressives           IMAGING AND PROCEDURES  Imaging and Procedures for 11/19/2024  OCT, Retina - OU - Both  Eyes  Right Eye Quality was good. Central Foveal Thickness: 289. Progression has worsened. Findings include normal foveal contour, no SRF, retinal drusen , intraretinal fluid, pigment epithelial detachment (Persistent peripapillary IRF/cystic changes-- slightly increased, stable improvement in focal SRF superior to disc, no fluid centrally).   Left Eye Quality was good. Central Foveal Thickness: 275. Progression has worsened. Findings include normal foveal contour, retinal drusen , intraretinal fluid, subretinal fluid (Persistent peripapillary IRF nasal macula--slightly increased, persistent shallow SRF, no fluid centrally).   Notes *Images captured and stored on drive  Diagnosis / Impression:  OD: Persistent peripapillary IRF/cystic changes- slightly increased, stable improvement in focal SRF superior to disc, no fluid centrally OS: Persistent peripapillary IRF nasal macula--slightly increased, persistent shallow SRF, no fluid centrally  Clinical management:  See below  Abbreviations: NFP - Normal foveal profile. CME - cystoid macular edema. PED - pigment epithelial detachment. IRF - intraretinal fluid. SRF - subretinal fluid. EZ - ellipsoid zone. ERM - epiretinal membrane. ORA - outer retinal atrophy. ORT - outer retinal tubulation. SRHM - subretinal hyper-reflective material. IRHM - intraretinal hyper-reflective material             ASSESSMENT/PLAN:    ICD-10-CM   1. Exudative age-related macular degeneration of both eyes with active choroidal neovascularization (HCC)  H35.3231 OCT, Retina - OU - Both Eyes    2. Essential hypertension  I10     3. Hypertensive retinopathy of both eyes  H35.033     4. Pseudophakia, both eyes  Z96.1      1. Peripapillary CNV / exudative ARMD OU - 8.01.22 pt with 1 wk history of decreased vision OS and isolated light green flashes OD -- flashes OD resolved, blurriness OS improving  - 10.04.22 small recurrent episode of flashes OD last  week.   - 11.15.22 subjectively blurred vision OS - repeat FA (08.23.22) shows mild peripapillary staining and ?leakage OU -- ?CNV  - unclear etiology, but suspect ARMD vs idiopathic CNV - s/p IVA OS #1 (11.15.22), #2 (12.13.22), #3 (01.10.23), #4 (02.09.23), #5 (01.29.25), #6 (02.26.25), #7 (04.07.25), #8 (05.13.25), #9 (06.17.25), #10 (07.29.25), #10 (9.09.25), #11 (10.22.25) - s/p IVA OD #1 (01.29.25), #2 (02.26.25) ==================================== - s/p IVE OS #1 (04.18.23), #2 (05.16.23), #3 (06.13.23), #4 (07.17.23), #5 (08.15.23), #6 (09.13.23), #7 (10.18.23), #8 (11.27.23), #9 (01.04.24), #10 (02.15.24), #11 (03.21.24), #12 (04.25.24), #13 (05.23.24), #14 (07.08.24), #15 (08.15.24), #16 (09.25.24), #17 (11.06.24), #18 (11.06.24) - s/p IVE OD #1 (07.17.23), #2 (08.15.23), #3 (09.13.23), #4 (10.18.23), #5 (11.27.23), #6 (01.04.24), #7 (02.15.24), #8 (03.21.24), #9 (04.25.24), #10 (05.23.24), #11 (07.08.24), #12 (08.15.24), #13 (09.25.24), #14 (11.06.24), #15 (11.06.24) ================================ - s/p IVV OD #1 (04.07.25, sample), #2 (05.13.25, sample), #3 (06.17.25, sample), #4 (sample 07.29.25), #5 (sample 09.09.25), #6 (sample 10.22.25) - BCVA stable at 20/20 OU - OCT shows OD: Persistent peripapillary IRF/cystic changes-- improved, almost resolved, stable improvement in focal SRF superior to disc, no fluid centrally; OS: Persistent peripapillary IRF nasal macula--improved, shallow SRF- improved, no fluid centrally at 6+ wks - recommend IVA OS #12 and IVV OD #7 (sample) today, 12.08.25 with follow up in 6 wks - pt wishes to proceed with injections  - RBA of procedure discussed, questions answered - IVA informed consent obtained and signed, 01.29.25 (OU) - IVV informed consent obtained and signed, 04.09.25 (OD) - see procedure note   - cont Amsler grid monitoring  - Eylea  approved for 2025, but [employer] Days funding unavailable  - f/u in 6 weeks, DFE, OCT, possible  injections  2,3. Hypertensive  retinopathy OU - discussed importance of tight BP control - continue to monitor  4. Pseudophakia OU  - s/p CE/IOL (Dr. Lavonia, 2022)  - IOLs in good position, doing well - continue to monitor  Ophthalmic Meds Ordered this visit:  No orders of the defined types were placed in this encounter.    Return in about 6 weeks (around 12/31/2024) for f/u, Ex. AMD, DFE, OCT, Possible, IVA, OD, IVV, OS.  There are no Patient Instructions on file for this visit.  This document serves as a record of services personally performed by [place] JUDITHANN Hans, MD, PhD. It was created on their behalf by Avelina Pereyra, COA an ophthalmic technician. The creation of this record is the provider's dictation and/or activities during the visit.   Electronically signed by: Avelina GORMAN Pereyra, COT  11/19/24  8:43 AM   This document serves as a record of services personally performed by [place] JUDITHANN Hans, MD, PhD. It was created on their behalf by Wanda GEANNIE Keens, COT an ophthalmic technician. The creation of this record is the provider's dictation and/or activities during the visit.    Electronically signed by:  Wanda GEANNIE Keens, COT  11/19/24 8:43 AM  [place] JUDITHANN Hans, M.D., Ph.D. Diseases & Surgery of the Retina and Vitreous [facility] 11/19/24  Abbreviations: M myopia (nearsighted); A astigmatism; H hyperopia (farsighted); P presbyopia; Mrx spectacle prescription;  CTL contact lenses; OD right eye; OS left eye; OU both eyes  XT exo meibomian gland dysfunction; ATs artificial tears; PFAT's preservative free artificial tears; NSC nuclear sclerotic cataract; PSC posterior subcapsular cataract; ERM epi-retinal membrane; PVD posterior vitreous detachment; RD retinal detachment; DM diabetes mellitus; DR diabetic retinopathy; NPDR non-proliferative diabetic retinopathy; PDR proliferative diabetic retinopathy; CSME clinically significant macular edema; DME diabetic  macular edema; dbh dot blot hemorrhages; CWS cotton wool spot; POAG primary open angle glaucoma; C/D cup-to-disc ratio; HVF humphrey visual field; GVF goldmann visual field; OCT optical coherence tomography; IOP intraocular pressure; BRVO Branch retinal vein occlusion; CRVO central retinal vein occlusion; CRAO central retinal artery occlusion; BRAO branch retinal artery occlusion; RT retinal tear; SB scleral buckle; PPV pars plana vitrectomy; VH Vitreous hemorrhage; PRP panretinal laser photocoagulation; IVK intravitreal kenalog; VMT vitreomacular traction; MH Macular hole;  NVD neovascularization of the disc; NVE neovascularization elsewhere; AREDS age related eye disease study; ARMD age related macular degeneration; POAG primary open angle glaucoma; EBMD epithelial/anterior basement membrane dystrophy; ACIOL anterior chamber intraocular lens; IOL intraocular lens; PCIOL posterior chamber intraocular lens; Phaco/IOL phacoemulsification with intraocular lens placement; PRK photorefractive keratectomy; LASIK laser assisted in situ keratomileusis; HTN hypertension; DM diabetes mellitus; COPD chronic obstructive pulmonary disease

## 2024-11-14 ENCOUNTER — Encounter (INDEPENDENT_AMBULATORY_CARE_PROVIDER_SITE_OTHER): Admitting: Ophthalmology

## 2024-11-14 DIAGNOSIS — I10 Essential (primary) hypertension: Secondary | ICD-10-CM

## 2024-11-14 DIAGNOSIS — H539 Unspecified visual disturbance: Secondary | ICD-10-CM

## 2024-11-14 DIAGNOSIS — H35033 Hypertensive retinopathy, bilateral: Secondary | ICD-10-CM

## 2024-11-14 DIAGNOSIS — H3581 Retinal edema: Secondary | ICD-10-CM

## 2024-11-14 DIAGNOSIS — Z961 Presence of intraocular lens: Secondary | ICD-10-CM

## 2024-11-14 DIAGNOSIS — H353231 Exudative age-related macular degeneration, bilateral, with active choroidal neovascularization: Secondary | ICD-10-CM

## 2024-11-16 DIAGNOSIS — M79605 Pain in left leg: Secondary | ICD-10-CM | POA: Diagnosis not present

## 2024-11-19 ENCOUNTER — Encounter (INDEPENDENT_AMBULATORY_CARE_PROVIDER_SITE_OTHER): Payer: Self-pay | Admitting: Ophthalmology

## 2024-11-19 ENCOUNTER — Ambulatory Visit (INDEPENDENT_AMBULATORY_CARE_PROVIDER_SITE_OTHER): Admitting: Ophthalmology

## 2024-11-19 DIAGNOSIS — H353231 Exudative age-related macular degeneration, bilateral, with active choroidal neovascularization: Secondary | ICD-10-CM

## 2024-11-19 DIAGNOSIS — H35033 Hypertensive retinopathy, bilateral: Secondary | ICD-10-CM

## 2024-11-19 DIAGNOSIS — I10 Essential (primary) hypertension: Secondary | ICD-10-CM | POA: Diagnosis not present

## 2024-11-19 DIAGNOSIS — Z961 Presence of intraocular lens: Secondary | ICD-10-CM

## 2024-11-20 ENCOUNTER — Encounter (INDEPENDENT_AMBULATORY_CARE_PROVIDER_SITE_OTHER): Payer: Self-pay | Admitting: Ophthalmology

## 2024-11-20 MED ORDER — FARICIMAB-SVOA 6 MG/0.05ML IZ SOLN
6.0000 mg | INTRAVITREAL | Status: AC | PRN
  Administered 2024-11-20: 6 mg via INTRAVITREAL

## 2024-11-20 MED ORDER — BEVACIZUMAB CHEMO INJECTION 1.25MG/0.05ML SYRINGE FOR KALEIDOSCOPE
1.2500 mg | INTRAVITREAL | Status: AC | PRN
  Administered 2024-11-20: 1.25 mg via INTRAVITREAL

## 2024-11-21 DIAGNOSIS — M79605 Pain in left leg: Secondary | ICD-10-CM | POA: Diagnosis not present

## 2024-11-23 DIAGNOSIS — J069 Acute upper respiratory infection, unspecified: Secondary | ICD-10-CM | POA: Diagnosis not present

## 2024-11-27 NOTE — Progress Notes (Unsigned)
 [facility] [address] [place] [address] Phone: [phone] Subjective:   Raymond Alexander, am serving as a scribe for Dr. Arthea Claudene.  I'm seeing this patient by the request  of:  Koirala, Dibas, MD  CC: Bilateral hip and left knee pain  YEP:Dlagzrupcz  09/28/2024 Known arthritis but stable.  Continue work on designer, fashion/clothing and swimming.  Discussed wearing flippers     Stable at the moment.     Discussed with patient about icing regimen and home exercises, which activities to do and which ones to avoid.  Increase activity slowly.  Discussed icing regimen.  Follow-up again in 12 weeks.     Update 11/28/2024 Raymond Alexander is a 78 y.o. male coming in with complaint of R shoulder, B hip and L knee pain.  Patient was seen previously and has known arthritic changes of the hips but was having more pain on the lateral aspect of the right hip.  Given a greater trochanteric injection.  Patient states that his shoulder has been doing well up until yesterday when it started hurting again after swimming.   Patient tore L hamstring playing table tennis about 4 weeks ago. Has been getting dry needling at [facility]. Has bruising throughout entire muscle. Able to swim without pain.   Continues to have intermittent groin pain in the R hip. Saw Dr. Hiram for his hip and was told that he does not need surgery for his hip.         Past Medical History:  Diagnosis Date   ASCVD (arteriosclerotic cardiovascular disease)    3 vessel   Clotting disorder    Esophageal reflux    Hyperlipidemia    Hypertension    Macular degeneration    Past Surgical History:  Procedure Laterality Date   CATARACT EXTRACTION     PTCA  10/14/2003   mid RCA   Social History   Socioeconomic History   Marital status: Married    Spouse name: Not on file   Number of children: 3   Years of education: Not on file   Highest education level: Not on file  Occupational  History   Not on file  Tobacco Use   Smoking status: Former   Smokeless tobacco: Never  Vaping Use   Vaping status: Never Used  Substance and Sexual Activity   Alcohol use: Yes    Alcohol/week: 1.0 standard drink of alcohol    Types: 1 Glasses of wine per week    Comment: 1-2 per day   Drug use: No   Sexual activity: Yes  Other Topics Concern   Not on file  Social History Narrative   Not on file   Social Drivers of Health   Tobacco Use: Medium Risk (11/20/2024)   Patient History    Smoking Tobacco Use: Former    Smokeless Tobacco Use: Never    Passive Exposure: Not on Actuary Strain: Not on file  Food Insecurity: Not on file  Transportation Needs: Not on file  Physical Activity: Not on file  Stress: Not on file  Social Connections: Not on file  Depression (EYV7-0): Not on file  Alcohol Screen: Not on file  Housing: Not on file  Utilities: Not on file  Health Literacy: Not on file   Allergies[1] Family History  Problem Relation Age of Onset   Heart attack Father    Cancer Mother    Diabetes Sister    Cancer Brother  Healthy Brother     Current Facility-Administered Medications (Endocrine & Metabolic):    methylPREDNISolone  acetate (DEPO-MEDROL ) injection 40 mg  Current Outpatient Medications (Cardiovascular):    atorvastatin (LIPITOR) 80 MG tablet, Take 80 mg by mouth daily.   ezetimibe (ZETIA) 10 MG tablet, Take 10 mg by mouth daily.   lisinopril (PRINIVIL,ZESTRIL) 10 MG tablet, Take 10 mg by mouth daily.   nitroGLYCERIN  (NITROSTAT ) 0.4 MG SL tablet, Place 1 tablet (0.4 mg total) under the tongue every 5 (five) minutes as needed for chest pain.   sildenafil (VIAGRA) 50 MG tablet, Take 50 mg by mouth daily as needed for erectile dysfunction.  Current Outpatient Medications (Respiratory):    fexofenadine (ALLEGRA) 180 MG tablet, Take 180 mg by mouth daily as needed for allergies.    fluticasone (FLONASE) 50 MCG/ACT nasal spray, Place 1 spray  into both nostrils daily. PRN  Current Outpatient Medications (Analgesics):    aspirin 81 MG tablet, Take 81 mg by mouth daily.   IBUPROFEN & ACETAMINOPHEN PO, Take by mouth as needed.  Current Outpatient Medications (Other):    Coenzyme Q10 (CO Q-10) 100 MG CAPS, Take 300 mg by mouth daily.   Mag Aspart-Potassium Aspart (POTASSIUM & MAGNESIUM ASPARTAT) 250-250 MG CAPS, Take 1 tablet by mouth daily.   Multiple Vitamin (MULTIVITAMIN) tablet, Take 1 tablet by mouth daily.   Omega-3 Fatty Acids (FISH OIL) 1000 MG CAPS, Take 2 capsules by mouth daily.   OVER THE COUNTER MEDICATION, Take 1 tablet by mouth daily. Med Name: PROTANDIM   Reviewed prior external information including notes and imaging from  primary care provider As well as notes that were available from care everywhere and other healthcare systems.  Past medical history, social, surgical and family history all reviewed in electronic medical record.  No pertanent information unless stated regarding to the chief complaint.   Review of Systems:  No headache, visual changes, nausea, vomiting, diarrhea, constipation, dizziness, abdominal pain, skin rash, fevers, chills, night sweats, weight loss, swollen lymph nodes, body aches, joint swelling, chest pain, shortness of breath, mood changes. POSITIVE muscle aches  Objective  There were no vitals taken for this visit.   General: No apparent distress alert and oriented x3 mood and affect normal, dressed appropriately.  HEENT: Pupils equal, extraocular movements intact  Respiratory: Patient's speak in full sentences and does not appear short of breath  Cardiovascular: No lower extremity edema, non tender, no erythema  Bilateral hip exam shows limited internal range of motion bilaterally. Left knee exam shows arthritic changes mostly of the patellofemoral joint. Right shoulder does have positive crossover noted.  Rotator cuff strength is intact.  Mild positive impingement with Neer and  Hawking's.  Procedure: Real-time Ultrasound Guided Injection of right acromioclavicular joint Device: GE Logiq Q7 Ultrasound guided injection is preferred based studies that show increased duration, increased effect, greater accuracy, decreased procedural pain, increased response rate, and decreased cost with ultrasound guided versus blind injection.  Verbal informed consent obtained.  Time-out conducted.  Noted no overlying erythema, induration, or other signs of local infection.  Skin prepped in a sterile fashion.  Local anesthesia: Topical Ethyl chloride.  With sterile technique and under real time ultrasound guidance: With a 25-gauge half inch needle injected with 0.5 cc of 0.5% Marcaine and 0.5 cc of Kenalog 40 mg/mL Completed without difficulty  Pain immediately resolved suggesting accurate placement of the medication.  Advised to call if fevers/chills, erythema, induration, drainage, or persistent bleeding.  Images saved Impression: Technically successful ultrasound  guided injection.     Impression and Recommendations:    The above documentation has been reviewed and is accurate and complete Raymond Alexander M Darreld Hoffer, DO        [1] No Known Allergies

## 2024-11-28 ENCOUNTER — Encounter: Payer: Self-pay | Admitting: Family Medicine

## 2024-11-28 ENCOUNTER — Other Ambulatory Visit: Payer: Self-pay

## 2024-11-28 ENCOUNTER — Ambulatory Visit: Admitting: Family Medicine

## 2024-11-28 VITALS — BP 104/62 | HR 74 | Ht 70.0 in | Wt 178.0 lb

## 2024-11-28 DIAGNOSIS — M25551 Pain in right hip: Secondary | ICD-10-CM | POA: Diagnosis not present

## 2024-11-28 DIAGNOSIS — M25552 Pain in left hip: Secondary | ICD-10-CM | POA: Diagnosis not present

## 2024-11-28 DIAGNOSIS — M19011 Primary osteoarthritis, right shoulder: Secondary | ICD-10-CM | POA: Diagnosis not present

## 2024-11-28 DIAGNOSIS — S76302A Unspecified injury of muscle, fascia and tendon of the posterior muscle group at thigh level, left thigh, initial encounter: Secondary | ICD-10-CM | POA: Diagnosis not present

## 2024-11-28 NOTE — Assessment & Plan Note (Signed)
 Has been doing very well but is having significant swelling noted again.  Discussed icing regimen and home exercises, discussed which activities to do and which ones to avoid.  Increase activity slowly.  Follow-up again in 3 months

## 2024-11-28 NOTE — Patient Instructions (Addendum)
 Injected R AC joint Thigh compression sleeve Heel lifts See me again in 10-12 weeks

## 2024-11-28 NOTE — Assessment & Plan Note (Signed)
 Patient is working with physical therapy already.  Discussed thigh compression sleeve as well as heel lifts in the shoes that I think could be beneficial while patient recovers.  Increase activity slowly.  Discussed icing regimen.  Follow-up again in 6 to 8 weeks

## 2024-12-21 NOTE — Progress Notes (Addendum)
 " [facility] - Clinic Note  12/31/2024    CHIEF COMPLAINT Patient presents for Retina Follow Up  HISTORY OF PRESENT ILLNESS: Raymond Alexander is a 79 y.o. male who presents to the clinic today for:   HPI     Retina Follow Up   Patient presents with  Wet AMD.  In both eyes.  This started 6 weeks ago.  Duration of 6 weeks.  Since onset it is stable.  I, the attending physician,  performed the HPI with the patient and updated documentation appropriately.        Comments   6 week retina follow up ARMD and IVA OD pt is reporting no vision changes noticed he denies any flashes has some floaters       Last edited by Valdemar Rogue, MD on 12/31/2024  1:29 PM.     Pt states the vision is doing well. He is no longer seeing flashes.   Referring physician: Regino Slater, MD [address] [place],  [place] [address]  HISTORICAL INFORMATION:   Selected notes from the MEDICAL RECORD NUMBER Referred by Dr. Glendia for concern of retinal edema OU   CURRENT MEDICATIONS: No current outpatient medications on file. (Ophthalmic Drugs)   No current facility-administered medications for this visit. (Ophthalmic Drugs)   Current Outpatient Medications (Other)  Medication Sig   aspirin 81 MG tablet Take 81 mg by mouth daily.   atorvastatin (LIPITOR) 80 MG tablet Take 80 mg by mouth daily.   Coenzyme Q10 (CO Q-10) 100 MG CAPS Take 300 mg by mouth daily.   ezetimibe (ZETIA) 10 MG tablet Take 10 mg by mouth daily.   fexofenadine (ALLEGRA) 180 MG tablet Take 180 mg by mouth daily as needed for allergies.    fluticasone (FLONASE) 50 MCG/ACT nasal spray Place 1 spray into both nostrils daily. PRN   IBUPROFEN & ACETAMINOPHEN PO Take by mouth as needed.   lisinopril (PRINIVIL,ZESTRIL) 10 MG tablet Take 10 mg by mouth daily.   Mag Aspart-Potassium Aspart (POTASSIUM & MAGNESIUM ASPARTAT) 250-250 MG CAPS Take 1 tablet by mouth daily.   Multiple Vitamin (MULTIVITAMIN)  tablet Take 1 tablet by mouth daily.   nitroGLYCERIN  (NITROSTAT ) 0.4 MG SL tablet Place 1 tablet (0.4 mg total) under the tongue every 5 (five) minutes as needed for chest pain.   Omega-3 Fatty Acids (FISH OIL) 1000 MG CAPS Take 2 capsules by mouth daily.   OVER THE COUNTER MEDICATION Take 1 tablet by mouth daily. Med Name: PROTANDIM   sildenafil (VIAGRA) 50 MG tablet Take 50 mg by mouth daily as needed for erectile dysfunction.   Current Facility-Administered Medications (Other)  Medication Route   methylPREDNISolone  acetate (DEPO-MEDROL ) injection 40 mg Other   REVIEW OF SYSTEMS: ROS   Positive for: Cardiovascular, Eyes Negative for: Constitutional, Gastrointestinal, Neurological, Skin, Genitourinary, Musculoskeletal, HENT, Endocrine, Respiratory, Psychiatric, Allergic/Imm, Heme/Lymph Last edited by Resa Delon ORN, COT on 12/31/2024 12:33 PM.      ALLERGIES No Known Allergies  PAST MEDICAL HISTORY Past Medical History:  Diagnosis Date   ASCVD (arteriosclerotic cardiovascular disease)    3 vessel   Clotting disorder    Esophageal reflux    Hyperlipidemia    Hypertension    Macular degeneration    Past Surgical History:  Procedure Laterality Date   CATARACT EXTRACTION     PTCA  10/14/2003   mid RCA   FAMILY HISTORY Family History  Problem Relation Age of Onset   Heart  attack Father    Cancer Mother    Diabetes Sister    Cancer Brother    Healthy Brother    SOCIAL HISTORY Social History   Tobacco Use   Smoking status: Former   Smokeless tobacco: Never  Vaping Use   Vaping status: Never Used  Substance Use Topics   Alcohol use: Yes    Alcohol/week: 1.0 standard drink of alcohol    Types: 1 Glasses of wine per week    Comment: 1-2 per day   Drug use: No       OPHTHALMIC EXAM:  Base Eye Exam     Visual Acuity (Snellen - Linear)       Right Left   Dist cc 20/20 -2 20/20 -1    Correction: Glasses         Tonometry (Tonopen, 12:37 PM)        Right Left   Pressure 16 16         Pupils       Pupils Dark Light Shape React APD   Right PERRL 3 2 Round Brisk None   Left PERRL 3 2 Round Brisk None         Visual Fields       Left Right    Full Full         Extraocular Movement       Right Left    Full, Ortho Full, Ortho         Neuro/Psych     Oriented x3: Yes   Mood/Affect: Normal         Dilation     Both eyes: 2.5% Phenylephrine @ 12:37 PM           Slit Lamp and Fundus Exam     Slit Lamp Exam       Right Left   Lids/Lashes Normal small stye nasal LL margin   Conjunctiva/Sclera White and quiet White and quiet   Cornea arcus, well healed cataract wound, trace PEE arcus, well healed cataract wound, trace PEE   Anterior Chamber Deep and quiet Deep and quiet   Iris Round and dilated Round and dilated   Lens PC IOL in good position Toric PC IOL in good position with marks at 0530 and 1100   Anterior Vitreous Mild Vitreous syneresis, no cell or pigment, low lying Posterior vitreous detachment, Weiss ring Mild Vitreous syneresis, no cell or pigment, Posterior vitreous detachment         Fundus Exam       Right Left   Disc Pink and Sharp, mild peripapillary edema--improved, ?peripapillary CNV ST to disc, no heme Pink and Sharp, peripapillary cystic changes -  improved, CNV ST disc, no heme   C/D Ratio 0.5 0.4   Macula Good foveal reflex, nasal cystic changes/edema -- improved, no heme Good foveal reflex, mild edema nasal macula -- improved, no heme   Vessels attenuated, Tortuous attenuated, Tortuous   Periphery Attached, No heme, no RT/RD Attached, No heme            Refraction     Wearing Rx       Sphere Cylinder Axis Add   Right +0.25 +0.25 153 +2.25   Left +0.00 +0.50 177 +2.25    Type: Progressives           IMAGING AND PROCEDURES  Imaging and Procedures for 12/31/2024  OCT, Retina - OU - Both Eyes       Right Eye Quality was good.  Central Foveal Thickness:  289. Progression has been stable. Findings include normal foveal contour, no SRF, retinal drusen , intraretinal fluid, pigment epithelial detachment (Persistent peripapillary IRF/cystic changes, stable improvement in focal SRF superior to disc, no fluid centrally).   Left Eye Quality was good. Central Foveal Thickness: 274. Progression has improved. Findings include normal foveal contour, no SRF, retinal drusen , intraretinal fluid, subretinal fluid (Persistent peripapillary IRF nasal macula--improved, shallow SRF- stably improved, no fluid centrally).   Notes *Images captured and stored on drive  Diagnosis / Impression:  OD: Persistent peripapillary IRF/cystic changes, stable improvement in focal SRF superior to disc, no fluid centrally OS: Persistent peripapillary IRF nasal macula--improved, shallow SRF- stably improved, no fluid centrally  Clinical management:  See below  Abbreviations: NFP - Normal foveal profile. CME - cystoid macular edema. PED - pigment epithelial detachment. IRF - intraretinal fluid. SRF - subretinal fluid. EZ - ellipsoid zone. ERM - epiretinal membrane. ORA - outer retinal atrophy. ORT - outer retinal tubulation. SRHM - subretinal hyper-reflective material. IRHM - intraretinal hyper-reflective material      Intravitreal Injection, Pharmacologic Agent - OD - Right Eye       Time Out 12/31/2024. 12:52 PM. Confirmed correct patient, procedure, site, and patient consented.   Anesthesia Topical anesthesia was used. Anesthetic medications included Lidocaine 2%, Proparacaine 0.5%.   Procedure Preparation included 5% betadine to ocular surface, eyelid speculum. A (32g) needle was used.   Injection: 6 mg faricimab -svoa 6 MG/0.05ML (Patient supplied)   Route: Intravitreal, Site: Right Eye   NDC: 49757-903-93, Lot: [id number], Expiration date: 01/12/2026, Waste: 0 mL   Post-op Post injection exam found visual acuity of at least counting fingers. The patient tolerated  the procedure well. There were no complications. The patient received written and verbal post procedure care education. Post injection medications were not given.   Notes **SAMPLE MEDICATION ADMINISTERED**     Intravitreal Injection, Pharmacologic Agent - OS - Left Eye       Time Out 12/31/2024. 12:52 PM. Confirmed correct patient, procedure, site, and patient consented.   Anesthesia Topical anesthesia was used. Anesthetic medications included Lidocaine 2%, Proparacaine 0.5%.   Procedure Preparation included 5% betadine to ocular surface, eyelid speculum. A (32g) needle was used.   Injection: 1.25 mg Bevacizumab  1.25mg /0.73ml   Route: Intravitreal, Site: Left Eye   NDC: C2662926, Lot: [id number], Expiration date: 02/28/2025   Post-op Post injection exam found visual acuity of at least counting fingers. The patient tolerated the procedure well. There were no complications. The patient received written and verbal post procedure care education. Post injection medications were not given.              ASSESSMENT/PLAN:    ICD-10-CM   1. Exudative age-related macular degeneration of both eyes with active choroidal neovascularization (HCC)  H35.3231 OCT, Retina - OU - Both Eyes    Intravitreal Injection, Pharmacologic Agent - OD - Right Eye    Intravitreal Injection, Pharmacologic Agent - OS - Left Eye    Bevacizumab  (AVASTIN ) SOLN 1.25 mg    faricimab -svoa (VABYSMO ) 6mg /0.47mL intravitreal injection    2. Essential hypertension  I10     3. Hypertensive retinopathy of both eyes  H35.033     4. Pseudophakia, both eyes  Z96.1      1. Peripapillary CNV / exudative ARMD OU - 8.01.22 pt with 1 wk history of decreased vision OS and isolated light green flashes OD -- flashes OD resolved, blurriness OS improving  - 10.04.22 small recurrent  episode of flashes OD last week.   - 11.15.22 subjectively blurred vision OS - repeat FA (08.23.22) shows mild peripapillary staining and  ?leakage OU -- ?CNV  - unclear etiology, but suspect ARMD vs idiopathic CNV - s/p IVA OS #1 (11.15.22), #2 (12.13.22), #3 (01.10.23), #4 (02.09.23), #5 (01.29.25), #6 (02.26.25), #7 (04.07.25), #8 (05.13.25), #9 (06.17.25), #10 (07.29.25), #10 (9.09.25), #11 (10.22.25), #12 (12.08.25) - s/p IVA OD #1 (01.29.25), #2 (02.26.25) - IVA resistance  ==================================== - s/p IVE OS #1 (04.18.23), #2 (05.16.23), #3 (06.13.23), #4 (07.17.23), #5 (08.15.23), #6 (09.13.23), #7 (10.18.23), #8 (11.27.23), #9 (01.04.24), #10 (02.15.24), #11 (03.21.24), #12 (04.25.24), #13 (05.23.24), #14 (07.08.24), #15 (08.15.24), #16 (09.25.24), #17 (11.06.24) - s/p IVE OD #1 (07.17.23), #2 (08.15.23), #3 (09.13.23), #4 (10.18.23), #5 (11.27.23), #6 (01.04.24), #7 (02.15.24), #8 (03.21.24), #9 (04.25.24), #10 (05.23.24), #11 (07.08.24), #12 (08.15.24), #13 (09.25.24), #14 (11.06.24) - IVE resistance ============= - s/p IVV OD #1 (04.07.25, sample), #2 (05.13.25, sample), #3 (06.17.25, sample), #4 (sample 07.29.25), #5 (sample 09.09.25), #6 (sample 10.22.25), #7 (sample 12.08.25) - BCVA stable at 20/20 OU - OCT shows OD: Persistent peripapillary IRF/cystic changes, stable improvement in focal SRF superior to disc, no fluid centrally; OS: Persistent peripapillary IRF nasal macula--improved, shallow SRF- improved, no fluid centrally at 6 wks - recommend IVA OS #13 and IVV OD #8 (sample) today, 01.19.26 with follow up in 6-7 wks - pt wishes to proceed with injections  - RBA of procedure discussed, questions answered - IVA informed consent obtained and signed, 01.19.26 (OU) - IVV informed consent obtained and signed, 04.09.25 (OD) - see procedure note   - cont Amsler grid monitoring - Eylea , Vabysmo  approved for 2026, but Good Days funding unavailable would owe 20%  - f/u in 6-7 weeks, DFE, OCT, possible injections   2,3. Hypertensive retinopathy OU - discussed importance of tight BP control - continue to  monitor   4. Pseudophakia OU  - s/p CE/IOL (Dr. Lavonia, 2022)  - IOLs in good position, doing well - continue to monitor   Ophthalmic Meds Ordered this visit:  Meds ordered this encounter  Medications   Bevacizumab  (AVASTIN ) SOLN 1.25 mg   faricimab -svoa (VABYSMO ) 6mg /0.39mL intravitreal injection     Return for 6-7 wks - Ex. AMD, DFE, OCT, Possible Injxn.  There are no Patient Instructions on file for this visit.  This document serves as a record of services personally performed by Redell JUDITHANN Hans, MD, PhD. It was created on their behalf by Paulina Jamse Gay an ophthalmic technician. The creation of this record is the provider's dictation and/or activities during the visit.   Electronically signed by: Alana D Fowler  12/31/24  1:33 PM   This document serves as a record of services personally performed by Redell JUDITHANN Hans, MD, PhD. It was created on their behalf by Wanda GEANNIE Keens, COT an ophthalmic technician. The creation of this record is the provider's dictation and/or activities during the visit.    Electronically signed by:  Wanda GEANNIE Keens, COT  12/31/24 1:33 PM  Redell JUDITHANN Hans, M.D., Ph.D. Diseases & Surgery of the Retina and Vitreous [facility]  I have reviewed the above documentation for accuracy and completeness, and I agree with the above. Redell JUDITHANN Hans, M.D., Ph.D. 12/31/24 1:33 PM    Abbreviations: M myopia (nearsighted); A astigmatism; H hyperopia (farsighted); P presbyopia; Mrx spectacle prescription;  CTL contact lenses; OD right eye; OS left eye; OU both eyes  XT exo meibomian gland  dysfunction; ATs artificial tears; PFAT's preservative free artificial tears; NSC nuclear sclerotic cataract; PSC posterior subcapsular cataract; ERM epi-retinal membrane; PVD posterior vitreous detachment; RD retinal detachment; DM diabetes mellitus; DR diabetic retinopathy; NPDR non-proliferative diabetic retinopathy; PDR proliferative diabetic  retinopathy; CSME clinically significant macular edema; DME diabetic macular edema; dbh dot blot hemorrhages; CWS cotton wool spot; POAG primary open angle glaucoma; C/D cup-to-disc ratio; HVF humphrey visual field; GVF goldmann visual field; OCT optical coherence tomography; IOP intraocular pressure; BRVO Branch retinal vein occlusion; CRVO central retinal vein occlusion; CRAO central retinal artery occlusion; BRAO branch retinal artery occlusion; RT retinal tear; SB scleral buckle; PPV pars plana vitrectomy; VH Vitreous hemorrhage; PRP panretinal laser photocoagulation; IVK intravitreal kenalog; VMT vitreomacular traction; MH Macular hole;  NVD neovascularization of the disc; NVE neovascularization elsewhere; AREDS age related eye disease study; ARMD age related macular degeneration; POAG primary open angle glaucoma; EBMD epithelial/anterior basement membrane dystrophy; ACIOL anterior chamber intraocular lens; IOL intraocular lens; PCIOL posterior chamber intraocular lens; Phaco/IOL phacoemulsification with intraocular lens placement; PRK photorefractive keratectomy; LASIK laser assisted in situ keratomileusis; HTN hypertension; DM diabetes mellitus; COPD chronic obstructive pulmonary disease "

## 2024-12-31 ENCOUNTER — Ambulatory Visit (INDEPENDENT_AMBULATORY_CARE_PROVIDER_SITE_OTHER): Admitting: Ophthalmology

## 2024-12-31 ENCOUNTER — Encounter (INDEPENDENT_AMBULATORY_CARE_PROVIDER_SITE_OTHER): Payer: Self-pay | Admitting: Ophthalmology

## 2024-12-31 DIAGNOSIS — Z961 Presence of intraocular lens: Secondary | ICD-10-CM | POA: Diagnosis not present

## 2024-12-31 DIAGNOSIS — H353231 Exudative age-related macular degeneration, bilateral, with active choroidal neovascularization: Secondary | ICD-10-CM | POA: Diagnosis not present

## 2024-12-31 DIAGNOSIS — H35033 Hypertensive retinopathy, bilateral: Secondary | ICD-10-CM | POA: Diagnosis not present

## 2024-12-31 DIAGNOSIS — I10 Essential (primary) hypertension: Secondary | ICD-10-CM

## 2024-12-31 MED ORDER — BEVACIZUMAB CHEMO INJECTION 1.25MG/0.05ML SYRINGE FOR KALEIDOSCOPE
1.2500 mg | INTRAVITREAL | Status: AC | PRN
  Administered 2024-12-31: 1.25 mg via INTRAVITREAL

## 2024-12-31 MED ORDER — FARICIMAB-SVOA 6 MG/0.05ML IZ SOSY
6.0000 mg | PREFILLED_SYRINGE | INTRAVITREAL | Status: AC | PRN
  Administered 2024-12-31: 6 mg via INTRAVITREAL

## 2025-02-06 ENCOUNTER — Ambulatory Visit: Admitting: Family Medicine

## 2025-02-07 ENCOUNTER — Encounter (INDEPENDENT_AMBULATORY_CARE_PROVIDER_SITE_OTHER): Admitting: Ophthalmology

## 2025-02-08 ENCOUNTER — Encounter (INDEPENDENT_AMBULATORY_CARE_PROVIDER_SITE_OTHER): Admitting: Ophthalmology

## 2025-02-15 ENCOUNTER — Encounter (INDEPENDENT_AMBULATORY_CARE_PROVIDER_SITE_OTHER): Admitting: Ophthalmology
